# Patient Record
Sex: Female | Born: 1970 | Race: White | Hispanic: No | Smoking: Former smoker
Health system: Southern US, Community
[De-identification: ages and names within clinical notes are randomized; demographics above are authoritative.]

## PROBLEM LIST (undated history)

## (undated) ENCOUNTER — Emergency Department (HOSPITAL_BASED_OUTPATIENT_CLINIC_OR_DEPARTMENT_OTHER): Admission: EM | Source: Home / Self Care

## (undated) DIAGNOSIS — F32A Depression, unspecified: Secondary | ICD-10-CM

## (undated) DIAGNOSIS — K589 Irritable bowel syndrome without diarrhea: Secondary | ICD-10-CM

## (undated) DIAGNOSIS — F319 Bipolar disorder, unspecified: Secondary | ICD-10-CM

## (undated) DIAGNOSIS — K8020 Calculus of gallbladder without cholecystitis without obstruction: Secondary | ICD-10-CM

## (undated) DIAGNOSIS — G8929 Other chronic pain: Secondary | ICD-10-CM

## (undated) DIAGNOSIS — I10 Essential (primary) hypertension: Secondary | ICD-10-CM

## (undated) DIAGNOSIS — M069 Rheumatoid arthritis, unspecified: Secondary | ICD-10-CM

## (undated) DIAGNOSIS — IMO0002 Reserved for concepts with insufficient information to code with codable children: Secondary | ICD-10-CM

## (undated) DIAGNOSIS — E079 Disorder of thyroid, unspecified: Secondary | ICD-10-CM

## (undated) DIAGNOSIS — E669 Obesity, unspecified: Secondary | ICD-10-CM

## (undated) DIAGNOSIS — M0520 Rheumatoid vasculitis with rheumatoid arthritis of unspecified site: Secondary | ICD-10-CM

## (undated) DIAGNOSIS — F329 Major depressive disorder, single episode, unspecified: Secondary | ICD-10-CM

## (undated) DIAGNOSIS — R519 Headache, unspecified: Secondary | ICD-10-CM

## (undated) HISTORY — PX: TERATOMA EXCISION: SHX2491

## (undated) HISTORY — DX: Headache, unspecified: R51.9

## (undated) HISTORY — PX: APPENDECTOMY: SHX54

## (undated) HISTORY — DX: Essential (primary) hypertension: I10

## (undated) HISTORY — DX: Reserved for concepts with insufficient information to code with codable children: IMO0002

## (undated) HISTORY — DX: Depression, unspecified: F32.A

## (undated) HISTORY — PX: MYOMECTOMY: SHX85

## (undated) HISTORY — DX: Calculus of gallbladder without cholecystitis without obstruction: K80.20

## (undated) HISTORY — DX: Rheumatoid vasculitis with rheumatoid arthritis of unspecified site: M05.20

## (undated) HISTORY — PX: CHOLECYSTECTOMY: SHX55

## (undated) HISTORY — DX: Obesity, unspecified: E66.9

## (undated) HISTORY — DX: Disorder of thyroid, unspecified: E07.9

## (undated) HISTORY — DX: Irritable bowel syndrome, unspecified: K58.9

## (undated) HISTORY — DX: Other chronic pain: G89.29

## (undated) HISTORY — PX: UPPER GASTROINTESTINAL ENDOSCOPY: SHX188

## (undated) HISTORY — PX: CYSTECTOMY: SUR359

## (undated) HISTORY — DX: Rheumatoid arthritis, unspecified: M06.9

## (undated) HISTORY — PX: ABDOMINAL HYSTERECTOMY: SHX81

## (undated) HISTORY — DX: Major depressive disorder, single episode, unspecified: F32.9

## (undated) HISTORY — PX: ESOPHAGOGASTRODUODENOSCOPY: SHX1529

---

## 1997-05-13 ENCOUNTER — Other Ambulatory Visit: Admission: RE | Admit: 1997-05-13 | Discharge: 1997-05-13 | Payer: Self-pay | Admitting: Gynecology

## 1997-06-19 ENCOUNTER — Other Ambulatory Visit: Admission: RE | Admit: 1997-06-19 | Discharge: 1997-06-19 | Payer: Self-pay | Admitting: Unknown Physician Specialty

## 2002-01-15 ENCOUNTER — Emergency Department (HOSPITAL_COMMUNITY): Admission: EM | Admit: 2002-01-15 | Discharge: 2002-01-15 | Payer: Self-pay | Admitting: Emergency Medicine

## 2002-02-11 ENCOUNTER — Other Ambulatory Visit: Admission: RE | Admit: 2002-02-11 | Discharge: 2002-02-11 | Payer: Self-pay | Admitting: Obstetrics and Gynecology

## 2002-02-13 ENCOUNTER — Encounter: Admission: RE | Admit: 2002-02-13 | Discharge: 2002-02-13 | Payer: Self-pay | Admitting: Obstetrics and Gynecology

## 2002-02-13 ENCOUNTER — Encounter: Payer: Self-pay | Admitting: Obstetrics and Gynecology

## 2005-03-01 ENCOUNTER — Other Ambulatory Visit: Admission: RE | Admit: 2005-03-01 | Discharge: 2005-03-01 | Payer: Self-pay | Admitting: Obstetrics and Gynecology

## 2005-03-17 ENCOUNTER — Encounter: Admission: RE | Admit: 2005-03-17 | Discharge: 2005-03-17 | Payer: Self-pay | Admitting: Obstetrics and Gynecology

## 2012-02-07 ENCOUNTER — Encounter: Payer: Self-pay | Admitting: *Deleted

## 2012-02-07 ENCOUNTER — Ambulatory Visit (INDEPENDENT_AMBULATORY_CARE_PROVIDER_SITE_OTHER): Payer: No Typology Code available for payment source | Admitting: Emergency Medicine

## 2012-02-07 VITALS — BP 106/76 | HR 111 | Temp 99.3°F | Resp 20 | Ht 69.0 in | Wt 264.0 lb

## 2012-02-07 DIAGNOSIS — R1110 Vomiting, unspecified: Secondary | ICD-10-CM

## 2012-02-07 DIAGNOSIS — A088 Other specified intestinal infections: Secondary | ICD-10-CM

## 2012-02-07 DIAGNOSIS — E860 Dehydration: Secondary | ICD-10-CM

## 2012-02-07 MED ORDER — ONDANSETRON 4 MG PO TBDP
8.0000 mg | ORAL_TABLET | Freq: Once | ORAL | Status: AC
  Administered 2012-02-07: 8 mg via ORAL

## 2012-02-07 MED ORDER — ONDANSETRON 8 MG PO TBDP: 8.0000 mg | ORAL_TABLET | Freq: Three times a day (TID) | ORAL | Status: DC | PRN

## 2012-02-07 NOTE — Progress Notes (Signed)
[facility] [address], [place] [address] [phone]  Date:  02/07/2012   Name:  Grace Butler   DOB:  04-20-1970   MRN:  [id number]  PCP:  No primary provider on file.    Chief Complaint: Vomiting   History of Present Illness:  Grace Butler is a 42 y.o. very pleasant female patient who presents with the following:  Ill since yesterday with persistent nausea and repeated vomiting.  Poor po intake.  Now has headache.  Headache is unusually severe for her and is throbbing and global.  No associated neuro or visual headaches.  Has loose watery stools.  No blood, mucous, or pus in stools.  Says fever and chills but has not taken temperature.  No ill contacts.    There is no problem list on file for this patient.   Past Medical History  Diagnosis Date  . Depression   . Thyroid disease   . Ulcer     Past Surgical History  Procedure Date  . Cholecystectomy   . Abdominal hysterectomy     History  Substance Use Topics  . Smoking status: Current Every Day Smoker -- 0.5 packs/day for 20 years  . Smokeless tobacco: Not on file  . Alcohol Use: No    No family history on file.  Allergies  Allergen Reactions  . Sulfa Antibiotics Hives    Medication list has been reviewed and updated.  Current Outpatient Prescriptions on File Prior to Visit  Medication Sig Dispense Refill  . ARIPiprazole (ABILIFY) 15 MG tablet Take 15 mg by mouth daily.      Marland Kitchen levothyroxine (SYNTHROID, LEVOTHROID) 100 MCG tablet Take by mouth daily.      Marland Kitchen omeprazole (PRILOSEC) 40 MG capsule Take 40 mg by mouth daily.      . QUEtiapine (SEROQUEL) 25 MG tablet Take 25 mg by mouth at bedtime.        Review of Systems:  As per HPI, otherwise negative.    Physical Examination: Filed Vitals:   02/07/12 1914  BP: 106/76  Pulse: 111  Temp: 99.3 F (37.4 C)  Resp: 20   Filed Vitals:   02/07/12 1914  Height: 5\' 9"  (1.753 m)  Weight: 264 lb (119.75 kg)   Body mass  index is 38.99 kg/(m^2). Ideal Body Weight: Weight in (lb) to have BMI = 25: 168.9   GEN: WDWN, NAD, Non-toxic, A & O x 3. Not icteric.  Moderately dry oropharynx HEENT: Atraumatic, Normocephalic. Neck supple. No masses, No LAD. Ears and Nose: No external deformity. CV: RRR, No M/G/R. No JVD. No thrill. No extra heart sounds. PULM: CTA B, no wheezes, crackles, rhonchi. No retractions. No resp. distress. No accessory muscle use. ABD: S, NT, ND, +BS. No rebound. No HSM. EXTR: No c/c/e NEURO Normal gait.  PSYCH: Normally interactive. Conversant. Not depressed or anxious appearing.  Calm demeanor.    Assessment and Plan: Dehydration Gastroenteritis zofran PO fluids at patient request  Carmelina Dane, MD

## 2015-01-21 ENCOUNTER — Other Ambulatory Visit (HOSPITAL_COMMUNITY)
Admission: RE | Admit: 2015-01-21 | Discharge: 2015-01-21 | Disposition: A | Discharge status: Home / Self Care | Payer: Managed Care, Other (non HMO) | Source: Ambulatory Visit | Attending: Obstetrics and Gynecology | Admitting: Obstetrics and Gynecology

## 2015-01-21 ENCOUNTER — Other Ambulatory Visit: Payer: Self-pay | Admitting: Obstetrics and Gynecology

## 2015-01-21 DIAGNOSIS — Z01419 Encounter for gynecological examination (general) (routine) without abnormal findings: Secondary | ICD-10-CM | POA: Diagnosis present

## 2015-01-21 DIAGNOSIS — Z1151 Encounter for screening for human papillomavirus (HPV): Secondary | ICD-10-CM | POA: Insufficient documentation

## 2015-01-23 LAB — CYTOLOGY - PAP

## 2015-08-21 DIAGNOSIS — G43909 Migraine, unspecified, not intractable, without status migrainosus: Secondary | ICD-10-CM | POA: Insufficient documentation

## 2015-08-21 DIAGNOSIS — F319 Bipolar disorder, unspecified: Secondary | ICD-10-CM | POA: Insufficient documentation

## 2015-08-21 DIAGNOSIS — K219 Gastro-esophageal reflux disease without esophagitis: Secondary | ICD-10-CM | POA: Insufficient documentation

## 2015-09-15 DIAGNOSIS — M8580 Other specified disorders of bone density and structure, unspecified site: Secondary | ICD-10-CM | POA: Insufficient documentation

## 2016-01-11 ENCOUNTER — Telehealth: Payer: Self-pay | Admitting: General Practice

## 2016-01-11 NOTE — Telephone Encounter (Signed)
Pt has been scheduled.  °

## 2016-01-11 NOTE — Telephone Encounter (Signed)
Pt called in because she is scheduled to get established with Dr. Patsy Lager. Pt has the concern that she has given out of her headaches/ migraines medication and need it sooner than she is scheduled to come in to see provider. Pt would like to know If provider could either see her sooner or if provider could provide her with a refill until visit scheduled. Advised pt that pcp would need to see her before refill.   Please advise for scheduling   CB: [phone]

## 2016-01-11 NOTE — Telephone Encounter (Signed)
Dr. Patsy Lager is fine with putting two slots together on this Thursday. Right now we have 9 & 9:15 slot and also 10 and 10:15 slot.

## 2016-01-14 ENCOUNTER — Encounter: Payer: Self-pay | Admitting: Family Medicine

## 2016-01-14 ENCOUNTER — Ambulatory Visit (INDEPENDENT_AMBULATORY_CARE_PROVIDER_SITE_OTHER): Payer: BLUE CROSS/BLUE SHIELD | Admitting: Family Medicine

## 2016-01-14 VITALS — BP 116/89 | HR 85 | Temp 98.3°F | Ht 68.5 in | Wt 303.6 lb

## 2016-01-14 DIAGNOSIS — E034 Atrophy of thyroid (acquired): Secondary | ICD-10-CM | POA: Insufficient documentation

## 2016-01-14 DIAGNOSIS — Z23 Encounter for immunization: Secondary | ICD-10-CM | POA: Diagnosis not present

## 2016-01-14 DIAGNOSIS — Z0000 Encounter for general adult medical examination without abnormal findings: Secondary | ICD-10-CM

## 2016-01-14 DIAGNOSIS — Z131 Encounter for screening for diabetes mellitus: Secondary | ICD-10-CM | POA: Diagnosis not present

## 2016-01-14 DIAGNOSIS — Z13220 Encounter for screening for lipoid disorders: Secondary | ICD-10-CM

## 2016-01-14 DIAGNOSIS — Z79890 Hormone replacement therapy (postmenopausal): Secondary | ICD-10-CM | POA: Insufficient documentation

## 2016-01-14 DIAGNOSIS — G43909 Migraine, unspecified, not intractable, without status migrainosus: Secondary | ICD-10-CM

## 2016-01-14 DIAGNOSIS — M069 Rheumatoid arthritis, unspecified: Secondary | ICD-10-CM | POA: Diagnosis not present

## 2016-01-14 DIAGNOSIS — E669 Obesity, unspecified: Secondary | ICD-10-CM | POA: Insufficient documentation

## 2016-01-14 DIAGNOSIS — M0600 Rheumatoid arthritis without rheumatoid factor, unspecified site: Secondary | ICD-10-CM | POA: Insufficient documentation

## 2016-01-14 LAB — CBC
HEMATOCRIT: 40.7 % (ref 36.0–46.0)
HEMOGLOBIN: 13.6 g/dL (ref 12.0–15.0)
MCHC: 33.5 g/dL (ref 30.0–36.0)
MCV: 92.1 fl (ref 78.0–100.0)
PLATELETS: 299 10*3/uL (ref 150.0–400.0)
RBC: 4.42 Mil/uL (ref 3.87–5.11)
RDW: 14.1 % (ref 11.5–15.5)
WBC: 6.3 10*3/uL (ref 4.0–10.5)

## 2016-01-14 LAB — LIPID PANEL
CHOLESTEROL: 247 mg/dL — AB (ref 0–200)
HDL: 55.1 mg/dL (ref >39.00)
LDL Cholesterol: 171 mg/dL — ABNORMAL HIGH (ref 0–99)
NonHDL: 191.47
Total CHOL/HDL Ratio: 4
Triglycerides: 101 mg/dL (ref 0.0–149.0)
VLDL: 20.2 mg/dL (ref 0.0–40.0)

## 2016-01-14 LAB — TSH: TSH: 7.97 u[IU]/mL — AB (ref 0.35–4.50)

## 2016-01-14 LAB — SEDIMENTATION RATE: SED RATE: 20 mm/h (ref 0–20)

## 2016-01-14 LAB — HEMOGLOBIN A1C: HEMOGLOBIN A1C: 5.5 % (ref 4.6–6.5)

## 2016-01-14 LAB — COMPREHENSIVE METABOLIC PANEL
ALBUMIN: 4.1 g/dL (ref 3.5–5.2)
ALT: 26 U/L (ref 0–35)
AST: 23 U/L (ref 0–37)
Alkaline Phosphatase: 81 U/L (ref 39–117)
BUN: 10 mg/dL (ref 6–23)
CALCIUM: 9.7 mg/dL (ref 8.4–10.5)
CO2: 27 mEq/L (ref 19–32)
CREATININE: 0.76 mg/dL (ref 0.40–1.20)
Chloride: 105 mEq/L (ref 96–112)
GFR: 87.72 mL/min (ref >60.00)
Glucose, Bld: 83 mg/dL (ref 70–99)
Potassium: 4.2 mEq/L (ref 3.5–5.1)
Sodium: 141 mEq/L (ref 135–145)
Total Bilirubin: 0.5 mg/dL (ref 0.2–1.2)
Total Protein: 7.3 g/dL (ref 6.0–8.3)

## 2016-01-14 MED ORDER — LEVOTHYROXINE SODIUM 137 MCG PO TABS: 137.0000 ug | ORAL_TABLET | Freq: Every day | ORAL | 3 refills | Status: DC

## 2016-01-14 MED ORDER — AMITRIPTYLINE HCL 50 MG PO TABS: 50.0000 mg | ORAL_TABLET | Freq: Every evening | ORAL | 3 refills | Status: DC

## 2016-01-14 MED ORDER — RIZATRIPTAN BENZOATE 10 MG PO TBDP: 10.0000 mg | ORAL_TABLET | ORAL | 3 refills | Status: DC | PRN

## 2016-01-14 NOTE — Patient Instructions (Signed)
It was very nice to meet you today- will look forward to working with you congrats on quitting smoking!  This is a great thing for your health I will be in touch with your labs asap I would encourage you to work on gradual weight loss- myfitnesspal or other iphone aps can be helpful in tracking calories and exercise. Weight loss of one lb per week is a good goal Take care and we can plan our next visit pending your lab results but likely will be 6 months
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
## 2016-01-16 ENCOUNTER — Encounter: Payer: Self-pay | Admitting: Family Medicine

## 2016-01-16 MED ORDER — LEVOTHYROXINE SODIUM 150 MCG PO TABS: 150.0000 ug | ORAL_TABLET | Freq: Every day | ORAL | 3 refills | Status: DC

## 2016-01-16 NOTE — Addendum Note (Signed)
Addended by: Abbe Amsterdam C on: 01/16/2016 02:29 PM   Modules accepted: Orders

## 2016-01-19 ENCOUNTER — Encounter: Payer: Self-pay | Admitting: Family Medicine

## 2016-02-08 ENCOUNTER — Encounter: Payer: Self-pay | Admitting: Family Medicine

## 2016-02-15 ENCOUNTER — Ambulatory Visit: Payer: Managed Care, Other (non HMO) | Admitting: Family Medicine

## 2016-02-18 ENCOUNTER — Ambulatory Visit: Payer: Managed Care, Other (non HMO) | Admitting: Family Medicine

## 2016-02-25 DIAGNOSIS — H52223 Regular astigmatism, bilateral: Secondary | ICD-10-CM | POA: Diagnosis not present

## 2016-02-25 DIAGNOSIS — H524 Presbyopia: Secondary | ICD-10-CM | POA: Diagnosis not present

## 2016-03-14 ENCOUNTER — Telehealth: Payer: Self-pay | Admitting: Family Medicine

## 2016-03-14 ENCOUNTER — Other Ambulatory Visit: Payer: Self-pay

## 2016-03-14 MED ORDER — OMEPRAZOLE 40 MG PO CPDR: 40.0000 mg | DELAYED_RELEASE_CAPSULE | Freq: Every day | ORAL | 1 refills | Status: DC

## 2016-03-14 NOTE — Telephone Encounter (Signed)
Caller name: Relationship to patient:Self Can be reached: [phone]  Pharmacy:  [facility] - [place], [place] - [address] [phone] (Phone) [phone] (Fax)     Reason for call: Refill omeprazole (PRILOSEC) 40 MG capsule

## 2016-03-22 DIAGNOSIS — F3175 Bipolar disorder, in partial remission, most recent episode depressed: Secondary | ICD-10-CM | POA: Diagnosis not present

## 2016-03-24 ENCOUNTER — Encounter: Payer: Self-pay | Admitting: Family Medicine

## 2016-03-24 MED ORDER — OMEPRAZOLE 40 MG PO CPDR: 40.0000 mg | DELAYED_RELEASE_CAPSULE | Freq: Two times a day (BID) | ORAL | 3 refills | Status: DC

## 2016-05-25 ENCOUNTER — Telehealth: Payer: Self-pay

## 2016-05-25 ENCOUNTER — Telehealth: Payer: Self-pay | Admitting: Family Medicine

## 2016-05-25 DIAGNOSIS — M19072 Primary osteoarthritis, left ankle and foot: Secondary | ICD-10-CM | POA: Diagnosis not present

## 2016-05-25 DIAGNOSIS — F17210 Nicotine dependence, cigarettes, uncomplicated: Secondary | ICD-10-CM | POA: Diagnosis not present

## 2016-05-25 DIAGNOSIS — M0589 Other rheumatoid arthritis with rheumatoid factor of multiple sites: Secondary | ICD-10-CM | POA: Diagnosis not present

## 2016-05-25 DIAGNOSIS — M19071 Primary osteoarthritis, right ankle and foot: Secondary | ICD-10-CM | POA: Diagnosis not present

## 2016-05-25 DIAGNOSIS — Z79899 Other long term (current) drug therapy: Secondary | ICD-10-CM | POA: Diagnosis not present

## 2016-05-25 DIAGNOSIS — M069 Rheumatoid arthritis, unspecified: Secondary | ICD-10-CM | POA: Diagnosis not present

## 2016-05-25 NOTE — Telephone Encounter (Signed)
Patient Name: Grace Butler  DOB: 09-02-1970    Initial Comment Caller says , heart palpitations during the last week, the last one was on Sat. she also was dizzy during that episode. None today.    Nurse Assessment  Nurse: Reed Pandy, RN, Amy Date/Time Lamount Cohen Time): 05/25/2016 1:22:17 PM  Confirm and document reason for call. If symptomatic, describe symptoms. ---Caller states she's had heart palpitations for a couple of months now and are becoming more frequent. The last episode she had was 4 days now. Says she felt light-headed with this episode. Says she is not having any symptoms today. She has noticed some new shortness of breath with activity but she thought it was due to her gaining weight.  Does the patient have any new or worsening symptoms? ---Yes  Will a triage be completed? ---Yes  Related visit to physician within the last 2 weeks? ---No  Does the PT have any chronic conditions? (i.e. diabetes, asthma, etc.) ---Yes  List chronic conditions. ---RA.  Is the patient pregnant or possibly pregnant? (Ask all females between the ages of 57-55) ---No  Is this a behavioral health or substance abuse call? ---No     Guidelines    Guideline Title Affirmed Question Affirmed Notes  Heart Rate and Heartbeat Questions New or worsened shortness of breath with activity (dyspnea on exertion)    Final Disposition User   Go to ED Now (or PCP triage) Reed Pandy, RN, Amy    Comments  call came in at 1232 during system update   Referrals  GO TO FACILITY REFUSED   Disagree/Comply: Disagree  Disagree/Comply Reason: Disagree with instructions

## 2016-05-25 NOTE — Telephone Encounter (Signed)
Patient called stating she is having heart palpitations. Transferred to [facility]

## 2016-05-25 NOTE — Telephone Encounter (Signed)
Received call from [facility] regarding patient haveing off and on Heart palpitations.States patient refused ER and UC visit. States she has appointment with Rheaumotologist,however  None listed in our system. Called patient no answer. Left message for return call

## 2016-07-08 ENCOUNTER — Ambulatory Visit (INDEPENDENT_AMBULATORY_CARE_PROVIDER_SITE_OTHER): Payer: BLUE CROSS/BLUE SHIELD | Admitting: Family Medicine

## 2016-07-08 VITALS — BP 108/72 | HR 91 | Temp 98.4°F | Ht 69.0 in | Wt 305.0 lb

## 2016-07-08 DIAGNOSIS — E034 Atrophy of thyroid (acquired): Secondary | ICD-10-CM | POA: Diagnosis not present

## 2016-07-08 DIAGNOSIS — M791 Myalgia, unspecified site: Secondary | ICD-10-CM

## 2016-07-08 LAB — C-REACTIVE PROTEIN: CRP: 0.6 mg/dL (ref 0.5–20.0)

## 2016-07-08 LAB — CBC
HEMATOCRIT: 40.6 % (ref 36.0–46.0)
HEMOGLOBIN: 13.7 g/dL (ref 12.0–15.0)
MCHC: 33.7 g/dL (ref 30.0–36.0)
MCV: 93.9 fl (ref 78.0–100.0)
Platelets: 285 10*3/uL (ref 150.0–400.0)
RBC: 4.33 Mil/uL (ref 3.87–5.11)
RDW: 14 % (ref 11.5–15.5)
WBC: 6.2 10*3/uL (ref 4.0–10.5)

## 2016-07-08 LAB — TSH: TSH: 2.05 u[IU]/mL (ref 0.35–4.50)

## 2016-07-08 LAB — VITAMIN D 25 HYDROXY (VIT D DEFICIENCY, FRACTURES): VITD: 31.97 ng/mL (ref 30.00–100.00)

## 2016-07-08 LAB — SEDIMENTATION RATE: Sed Rate: 28 mm/hr — ABNORMAL HIGH (ref 0–20)

## 2016-07-08 NOTE — Progress Notes (Signed)
Pre visit review using our clinic review tool, if applicable. No additional management support is needed unless otherwise documented below in the visit note. 

## 2016-07-08 NOTE — Patient Instructions (Addendum)
I will release your labs as they come back via MyChart.  If you change your mind about going on a course of steroids, send me a MyChart message or call.  Heat (pad or rice pillow in microwave) over affected area, 10-15 minutes every 2-3 hours while awake.

## 2016-07-08 NOTE — Progress Notes (Signed)
Chief Complaint  Patient presents with  . Pain    muscle  . Fatigue    Subjective: Patient is a 46 y.o. female here for diffuse muscle pain.  The patient describes diffuse muscle achiness that started around 3 weeks ago. She woke up one day and felt like should the fluid of the other symptoms. She has a history of rheumatoid arthritis and notes that none of the treatments for that have been effective with her muscle pains. Her legs seem to be worse. She has been using diclofenac at home with little relief. She denies any change in activity or injury. There were no changes in oral intake, recent travel, fevers, sickness, sick contacts, or medication changes. No rashes or weakness.  ROS: Neuro: no weakness Msk: As noted in HPI   Past Medical History:  Diagnosis Date  . Depression   . Thyroid disease   . Ulcer ([place])    Allergies  Allergen Reactions  . Sulfa Antibiotics Hives    Current Outpatient Prescriptions:  .  amitriptyline (ELAVIL) 50 MG tablet, Take 1 tablet (50 mg total) by mouth every evening., Disp: 90 tablet, Rfl: 3 .  amphetamine-dextroamphetamine (ADDERALL XR) 20 MG 24 hr capsule, Take 1 capsule by mouth daily., Disp: , Rfl:  .  ARIPiprazole (ABILIFY) 15 MG tablet, Take 15 mg by mouth daily., Disp: , Rfl:  .  cholecalciferol (VITAMIN D) 1000 UNITS tablet, Take 1,000 Units by mouth daily., Disp: , Rfl:  .  Cyanocobalamin (VITAMIN B12 PO), Take 1 tablet by mouth daily., Disp: , Rfl:  .  diclofenac (VOLTAREN) 25 MG EC tablet, Take 25 mg by mouth., Disp: , Rfl:  .  estradiol (CLIMARA - DOSED IN MG/24 HR) 0.05 mg/24hr patch, Place 1 patch onto the skin every 7 days., Disp: , Rfl:  .  levothyroxine (SYNTHROID, LEVOTHROID) 150 MCG tablet, Take 1 tablet (150 mcg total) by mouth daily., Disp: 90 tablet, Rfl: 3 .  omeprazole (PRILOSEC) 40 MG capsule, Take 1 capsule (40 mg total) by mouth 2 (two) times daily., Disp: 180 capsule, Rfl: 3 .  QUEtiapine (SEROQUEL) 25 MG tablet, Take  1 and 1/2 daily, Disp: , Rfl:  .  rizatriptan (MAXALT-MLT) 10 MG disintegrating tablet, Take 1 tablet (10 mg total) by mouth as needed for migraine. May repeat in 2 hours if needed, Disp: 12 tablet, Rfl: 3 .  senna (SENOKOT) 8.6 MG tablet, Take 1 tablet by mouth daily., Disp: , Rfl:  .  Tofacitinib Citrate 5 MG TABS, Take 2 tablets by mouth daily., Disp: , Rfl:   Objective: BP 108/72 (BP Location: Left Arm, Patient Position: Sitting, Cuff Size: Large)   Pulse 91   Temp 98.4 F (36.9 C) (Oral)   Ht _0  (1.753 m)   Wt (!) 305 lb (138.3 kg)   SpO2 96%   BMI 45.04 kg/m  General: Awake, appears stated age HEENT: MMM, appropriate pool of saliva on floor of mouth, EOMi Heart: RRR, no murmurs Lungs: CTAB, no rales, wheezes or rhonchi. No accessory muscle use Abd: BS+, soft, NT, ND, no masses or organomegaly MSK: Diffuse and mild TTP over paraspinal musculature, UE's, LE's muscle groups; 5/5 strength throughout Neuro: Gait normal, DTR's equal and symmetry, no clonus or cerebellar signs Psych: Age appropriate judgment and insight, normal affect and mood  Assessment and Plan: Myalgia - Plan: CBC, Sed Rate (ESR), C-reactive protein, Aldolase, Vitamin D (25 hydroxy)  Hypothyroidism due to acquired atrophy of thyroid - Plan: TSH, T4  Orders  as above. TSH was elevated, she reports compliance with replacement, will recheck today.  Will make sure she doesn't have polymyositis, offered course of steroids, but she declined at this time. She is to call if she changes her mind with this.  F/u pending above.  The patient voiced understanding and agreement to the plan.  [place], DO 07/08/16  8:03 AM

## 2016-07-09 LAB — T4: T4, Total: 9 ug/dL (ref 4.5–12.0)

## 2016-07-11 LAB — ALDOLASE: Aldolase: 4.9 U/L (ref <=8.1)

## 2016-07-12 ENCOUNTER — Encounter: Payer: Self-pay | Admitting: Family Medicine

## 2016-07-13 ENCOUNTER — Other Ambulatory Visit: Payer: Self-pay | Admitting: Family Medicine

## 2016-07-13 MED ORDER — PREDNISONE 20 MG PO TABS: 40.0000 mg | ORAL_TABLET | Freq: Every day | ORAL | 0 refills | Status: AC

## 2016-07-13 NOTE — Progress Notes (Signed)
Steroids called in for pain. Encouraged w/u with rheumatologist. Notified via MyChart.

## 2016-07-28 ENCOUNTER — Encounter: Payer: Self-pay | Admitting: Family Medicine

## 2016-07-29 ENCOUNTER — Telehealth: Payer: Self-pay | Admitting: Medical

## 2016-07-29 NOTE — Telephone Encounter (Signed)
Referral for bariatric

## 2016-08-03 DIAGNOSIS — R5383 Other fatigue: Secondary | ICD-10-CM | POA: Diagnosis not present

## 2016-08-03 DIAGNOSIS — Z79899 Other long term (current) drug therapy: Secondary | ICD-10-CM | POA: Diagnosis not present

## 2016-08-03 DIAGNOSIS — M8580 Other specified disorders of bone density and structure, unspecified site: Secondary | ICD-10-CM | POA: Diagnosis not present

## 2016-08-03 DIAGNOSIS — M0600 Rheumatoid arthritis without rheumatoid factor, unspecified site: Secondary | ICD-10-CM | POA: Diagnosis not present

## 2016-08-03 DIAGNOSIS — M1990 Unspecified osteoarthritis, unspecified site: Secondary | ICD-10-CM | POA: Diagnosis not present

## 2016-08-03 DIAGNOSIS — G5601 Carpal tunnel syndrome, right upper limb: Secondary | ICD-10-CM | POA: Diagnosis not present

## 2016-08-03 DIAGNOSIS — Z7952 Long term (current) use of systemic steroids: Secondary | ICD-10-CM | POA: Diagnosis not present

## 2016-08-17 ENCOUNTER — Encounter: Payer: Self-pay | Admitting: Family Medicine

## 2016-08-19 DIAGNOSIS — M8588 Other specified disorders of bone density and structure, other site: Secondary | ICD-10-CM | POA: Diagnosis not present

## 2016-08-30 ENCOUNTER — Encounter: Payer: Self-pay | Admitting: Family

## 2016-08-30 ENCOUNTER — Ambulatory Visit (INDEPENDENT_AMBULATORY_CARE_PROVIDER_SITE_OTHER): Payer: BLUE CROSS/BLUE SHIELD | Admitting: Family

## 2016-08-30 VITALS — BP 130/92 | HR 99 | Temp 98.9°F | Ht 69.0 in | Wt 310.0 lb

## 2016-08-30 DIAGNOSIS — M25562 Pain in left knee: Secondary | ICD-10-CM | POA: Diagnosis not present

## 2016-08-30 MED ORDER — PREDNISONE 20 MG PO TABS: 40.0000 mg | ORAL_TABLET | Freq: Every day | ORAL | Status: DC
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
## 2016-08-30 NOTE — Patient Instructions (Signed)
You will be contacted about your referral to Dr. Pearletha Forge, sports medicine.

## 2016-08-31 ENCOUNTER — Encounter: Payer: Self-pay | Admitting: Family Medicine

## 2016-08-31 ENCOUNTER — Ambulatory Visit (INDEPENDENT_AMBULATORY_CARE_PROVIDER_SITE_OTHER): Payer: BLUE CROSS/BLUE SHIELD | Admitting: Family Medicine

## 2016-08-31 DIAGNOSIS — M25562 Pain in left knee: Secondary | ICD-10-CM

## 2016-08-31 NOTE — Patient Instructions (Signed)
You have had a patellar subluxation (kneecap). Ice the area for 15 minutes at a time 3-4 times a day. Elevate above the level of your heart when possible. Take your usual medications for RA as you have been with tylenol as needed. Hinged knee brace for support when up and walking around though an immobilizer is also an option (we usually reserve these for full dislocations because your quad strength decreases in this). Straight leg raises, straight leg raises with foot turned outwards, and hip side raises 3 sets of 10 once a day. Add ankle weight if these become too easy. Consider physical therapy if not improving. Follow up with me in about 1 month but call me sooner if you're struggling.

## 2016-09-01 DIAGNOSIS — G8929 Other chronic pain: Secondary | ICD-10-CM | POA: Insufficient documentation

## 2016-09-01 DIAGNOSIS — M25562 Pain in left knee: Secondary | ICD-10-CM | POA: Insufficient documentation
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
## 2016-09-01 NOTE — Assessment & Plan Note (Signed)
consistent with patellar subluxation.  Icing, elevation.  Knee brace for support when up and walking around.  Shown home exercises to do daily - she prefers to do this instead of physical therapy for now.  She will continue her prednisone.  F/u in 1 month for reevaluation.

## 2016-09-29 DIAGNOSIS — F3175 Bipolar disorder, in partial remission, most recent episode depressed: Secondary | ICD-10-CM | POA: Diagnosis not present

## 2016-10-12 ENCOUNTER — Ambulatory Visit: Payer: BLUE CROSS/BLUE SHIELD | Admitting: Family Medicine

## 2016-10-29 ENCOUNTER — Other Ambulatory Visit: Payer: Self-pay | Admitting: Family Medicine

## 2016-10-29 DIAGNOSIS — G43909 Migraine, unspecified, not intractable, without status migrainosus: Secondary | ICD-10-CM

## 2016-11-12 ENCOUNTER — Other Ambulatory Visit: Payer: Self-pay | Admitting: Family Medicine

## 2016-11-12 DIAGNOSIS — E034 Atrophy of thyroid (acquired): Secondary | ICD-10-CM

## 2016-12-09 ENCOUNTER — Other Ambulatory Visit: Payer: Self-pay | Admitting: Family Medicine

## 2017-01-01 ENCOUNTER — Encounter: Payer: Self-pay | Admitting: Family Medicine

## 2017-01-01 DIAGNOSIS — Z79899 Other long term (current) drug therapy: Secondary | ICD-10-CM

## 2017-01-01 DIAGNOSIS — M069 Rheumatoid arthritis, unspecified: Secondary | ICD-10-CM

## 2017-01-03 HISTORY — PX: KNEE ARTHROSCOPY: SHX127

## 2017-01-05 NOTE — Telephone Encounter (Signed)
Ordered CBC, BMP and liver function panel Fax results to [phone] attn Dr. Fabio Asa

## 2017-01-06 ENCOUNTER — Other Ambulatory Visit (INDEPENDENT_AMBULATORY_CARE_PROVIDER_SITE_OTHER): Payer: BLUE CROSS/BLUE SHIELD

## 2017-01-06 DIAGNOSIS — M069 Rheumatoid arthritis, unspecified: Secondary | ICD-10-CM

## 2017-01-06 DIAGNOSIS — Z79899 Other long term (current) drug therapy: Secondary | ICD-10-CM

## 2017-01-06 LAB — BASIC METABOLIC PANEL
BUN: 14 mg/dL (ref 6–23)
CALCIUM: 9.5 mg/dL (ref 8.4–10.5)
CO2: 28 mEq/L (ref 19–32)
CREATININE: 0.82 mg/dL (ref 0.40–1.20)
Chloride: 102 mEq/L (ref 96–112)
GFR: 80 mL/min (ref >60.00)
GLUCOSE: 82 mg/dL (ref 70–99)
Potassium: 4.2 mEq/L (ref 3.5–5.1)
SODIUM: 139 meq/L (ref 135–145)

## 2017-01-06 LAB — CBC
HCT: 41.6 % (ref 36.0–46.0)
HEMOGLOBIN: 13.6 g/dL (ref 12.0–15.0)
MCHC: 32.8 g/dL (ref 30.0–36.0)
MCV: 94.9 fl (ref 78.0–100.0)
PLATELETS: 331 10*3/uL (ref 150.0–400.0)
RBC: 4.38 Mil/uL (ref 3.87–5.11)
RDW: 14.4 % (ref 11.5–15.5)
WBC: 8.4 10*3/uL (ref 4.0–10.5)

## 2017-01-06 LAB — HEPATIC FUNCTION PANEL
ALBUMIN: 4.2 g/dL (ref 3.5–5.2)
ALK PHOS: 99 U/L (ref 39–117)
ALT: 36 U/L — ABNORMAL HIGH (ref 0–35)
AST: 29 U/L (ref 0–37)
Bilirubin, Direct: 0.1 mg/dL (ref 0.0–0.3)
TOTAL PROTEIN: 7.5 g/dL (ref 6.0–8.3)
Total Bilirubin: 0.5 mg/dL (ref 0.2–1.2)

## 2017-01-06 NOTE — Addendum Note (Signed)
Addended by: Harley Alto on: 01/06/2017 01:09 PM   Modules accepted: Orders

## 2017-01-06 NOTE — Addendum Note (Signed)
Addended by: Maghan Jessee M on: 01/06/2017 01:09 PM   Modules accepted: Orders  

## 2017-01-06 NOTE — Addendum Note (Signed)
Addended by: PRICE, KRISTY M on: 01/06/2017 01:09 PM   Modules accepted: Orders  

## 2017-01-07 ENCOUNTER — Encounter: Payer: Self-pay | Admitting: Family Medicine

## 2017-01-12 ENCOUNTER — Ambulatory Visit: Payer: BLUE CROSS/BLUE SHIELD | Admitting: Family Medicine

## 2017-01-13 NOTE — Progress Notes (Signed)
Faxed

## 2017-01-16 ENCOUNTER — Ambulatory Visit (HOSPITAL_BASED_OUTPATIENT_CLINIC_OR_DEPARTMENT_OTHER)
Admission: RE | Admit: 2017-01-16 | Discharge: 2017-01-16 | Disposition: A | Discharge status: Home / Self Care | Payer: BLUE CROSS/BLUE SHIELD | Source: Ambulatory Visit | Attending: Family Medicine | Admitting: Family Medicine

## 2017-01-16 ENCOUNTER — Encounter: Payer: Self-pay | Admitting: Family Medicine

## 2017-01-16 ENCOUNTER — Ambulatory Visit: Payer: BLUE CROSS/BLUE SHIELD | Admitting: Family Medicine

## 2017-01-16 VITALS — BP 139/41 | HR 82 | Ht 69.0 in | Wt 310.0 lb

## 2017-01-16 DIAGNOSIS — M25562 Pain in left knee: Secondary | ICD-10-CM | POA: Diagnosis not present

## 2017-01-16 DIAGNOSIS — M722 Plantar fascial fibromatosis: Secondary | ICD-10-CM

## 2017-01-16 MED ORDER — DIAZEPAM 5 MG PO TABS: ORAL_TABLET | ORAL | 0 refills | Status: DC

## 2017-01-16 NOTE — Patient Instructions (Signed)
We will go ahead with an MRI of your knee to assess for a meniscus tear and to characterize the small calcification in the medial compartment. We will check with the imaging place to make sure they have an open MRI and it's not in a truck. I sent valium in for you to take before the MRI. Follow up will depend on the MRI.

## 2017-01-17 ENCOUNTER — Ambulatory Visit: Payer: BLUE CROSS/BLUE SHIELD | Admitting: Family Medicine

## 2017-01-17 ENCOUNTER — Encounter: Payer: Self-pay | Admitting: Family Medicine

## 2017-01-17 DIAGNOSIS — M722 Plantar fascial fibromatosis: Secondary | ICD-10-CM | POA: Insufficient documentation

## 2017-01-17 NOTE — Assessment & Plan Note (Signed)
initial concern for patellar subluxation - now with medial pain in addition to post patellar facet tenderness.  Independently reviewed radiographs and very little arthritis - small calcification medially - ? If within medial meniscus, this is well corticated.  As she's not improving with conservative treatment will go ahead with MRI to further assess for meniscus tear.  Knee brace, icing, elevation.  Takes prednisone - continue this.  Tylenol if needed.

## 2017-01-17 NOTE — Assessment & Plan Note (Signed)
discussed arch binders, stretches, arch supports.
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
## 2017-01-18 NOTE — Addendum Note (Signed)
Addended by: Kathi Simpers F on: 01/18/2017 02:20 PM   Modules accepted: Orders

## 2017-01-25 ENCOUNTER — Ambulatory Visit
Admission: RE | Admit: 2017-01-25 | Discharge: 2017-01-25 | Disposition: A | Discharge status: Home / Self Care | Payer: BLUE CROSS/BLUE SHIELD | Source: Ambulatory Visit | Attending: Family Medicine | Admitting: Family Medicine

## 2017-01-25 DIAGNOSIS — M25562 Pain in left knee: Secondary | ICD-10-CM | POA: Diagnosis not present

## 2017-01-30 ENCOUNTER — Telehealth: Payer: Self-pay | Admitting: Family Medicine

## 2017-01-30 NOTE — Addendum Note (Signed)
Addended by: Kathi Simpers F on: 01/30/2017 09:23 AM   Modules accepted: Orders

## 2017-01-30 NOTE — Telephone Encounter (Signed)
Ok to go ahead with referral - for meniscus tears, osteochondral defect of her knee.  Thanks!

## 2017-01-30 NOTE — Telephone Encounter (Signed)
Correction: Orthopedic surgeon is Dr. Di Kindle. [place]um[place].   [place] on [address]

## 2017-01-30 NOTE — Telephone Encounter (Signed)
Patient left message regarding orthopedic surgeon for knee surgery.  States she would like to be sent to Dr. Chesley Mires. Patient said he works at [employer] in [place]

## 2017-02-06 DIAGNOSIS — E039 Hypothyroidism, unspecified: Secondary | ICD-10-CM | POA: Diagnosis not present

## 2017-02-06 DIAGNOSIS — Z01818 Encounter for other preprocedural examination: Secondary | ICD-10-CM | POA: Diagnosis not present

## 2017-02-06 DIAGNOSIS — K219 Gastro-esophageal reflux disease without esophagitis: Secondary | ICD-10-CM | POA: Diagnosis not present

## 2017-02-07 DIAGNOSIS — R52 Pain, unspecified: Secondary | ICD-10-CM | POA: Diagnosis not present

## 2017-02-07 DIAGNOSIS — S83207D Unspecified tear of unspecified meniscus, current injury, left knee, subsequent encounter: Secondary | ICD-10-CM | POA: Diagnosis not present

## 2017-02-10 DIAGNOSIS — S83207D Unspecified tear of unspecified meniscus, current injury, left knee, subsequent encounter: Secondary | ICD-10-CM

## 2017-02-10 HISTORY — DX: Unspecified tear of unspecified meniscus, current injury, left knee, subsequent encounter: S83.207D

## 2017-02-24 ENCOUNTER — Encounter (HOSPITAL_BASED_OUTPATIENT_CLINIC_OR_DEPARTMENT_OTHER): Payer: Self-pay | Admitting: *Deleted

## 2017-02-24 ENCOUNTER — Emergency Department (HOSPITAL_BASED_OUTPATIENT_CLINIC_OR_DEPARTMENT_OTHER)
Admission: EM | Admit: 2017-02-24 | Discharge: 2017-02-24 | Disposition: A | Discharge status: Home / Self Care | Payer: BLUE CROSS/BLUE SHIELD | Attending: Emergency Medicine | Admitting: Emergency Medicine

## 2017-02-24 ENCOUNTER — Emergency Department (HOSPITAL_BASED_OUTPATIENT_CLINIC_OR_DEPARTMENT_OTHER): Payer: BLUE CROSS/BLUE SHIELD

## 2017-02-24 ENCOUNTER — Other Ambulatory Visit: Payer: Self-pay

## 2017-02-24 DIAGNOSIS — F17200 Nicotine dependence, unspecified, uncomplicated: Secondary | ICD-10-CM | POA: Diagnosis not present

## 2017-02-24 DIAGNOSIS — Z79899 Other long term (current) drug therapy: Secondary | ICD-10-CM | POA: Insufficient documentation

## 2017-02-24 DIAGNOSIS — S99922A Unspecified injury of left foot, initial encounter: Secondary | ICD-10-CM | POA: Diagnosis not present

## 2017-02-24 DIAGNOSIS — E039 Hypothyroidism, unspecified: Secondary | ICD-10-CM | POA: Insufficient documentation

## 2017-02-24 DIAGNOSIS — M79672 Pain in left foot: Secondary | ICD-10-CM | POA: Insufficient documentation

## 2017-02-24 DIAGNOSIS — M25572 Pain in left ankle and joints of left foot: Secondary | ICD-10-CM | POA: Diagnosis not present

## 2017-02-24 NOTE — ED Notes (Signed)
PT did not wish to have crutches because she will have them after her knee surgery.

## 2017-02-24 NOTE — ED Provider Notes (Signed)
[facility] EMERGENCY DEPARTMENT Provider Note   CSN: [id number] Arrival date & time: 02/24/17  1858     History   Chief Complaint Chief Complaint  Patient presents with  . Foot Pain    HPI Grace Butler is a 47 y.o. female.  Patient with history of rheumatoid arthritis presents to the emergency department today with complaint of acute onset of left foot pain that occurred when she took a normal step today.  She felt a pop when it occurred.  Pain has been constant.  It is worse when she steps or bears weight on it.  Patient has a history of left meniscal repair with upcoming surgery scheduled, and she notes that she has been walking on the outside of the foot recently.  She denies any knee pain or hip pain.  No treatments prior to arrival.  She is due to start a course of prednisone for RA type of pain.  Patient also takes diclofenac.      Past Medical History:  Diagnosis Date  . Depression   . Thyroid disease   . Ulcer     Patient Active Problem List   Diagnosis Date Noted  . Plantar fasciitis of left foot 01/17/2017  . Left knee pain 09/01/2016  . Obesity 01/14/2016  . Rheumatoid arthritis (HCC) 01/14/2016  . Hypothyroidism due to acquired atrophy of thyroid 01/14/2016  . Hormone replacement therapy 01/14/2016  . Osteopenia 09/15/2015  . Bipolar 1 disorder (HCC) 08/21/2015  . Gastroesophageal reflux disease without esophagitis 08/21/2015  . Migraine 08/21/2015    Past Surgical History:  Procedure Laterality Date  . ABDOMINAL HYSTERECTOMY    . CHOLECYSTECTOMY    . 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 44    OB History    No data available       Home Medications    Prior to Admission medications   Medication Sig Start Date End Date Taking? Authorizing Provider  alendronate (FOSAMAX) 70 MG tablet Take 70 mg by mouth. 08/31/16   [provider]  amitriptyline (ELAVIL) 50 MG tablet TAKE 1 TABLET BY MOUTH EVERY EVENING 11/01/16   Copland, Gwenlyn Found,  MD  amphetamine-dextroamphetamine (ADDERALL XR) 20 MG 24 hr capsule Take 1 capsule by mouth daily. 02/01/14   [provider]  ARIPiprazole (ABILIFY) 15 MG tablet Take 15 mg by mouth daily.    [provider]  cholecalciferol (VITAMIN D) 1000 UNITS tablet Take 1,000 Units by mouth daily.    [provider]  Cyanocobalamin (VITAMIN B12 PO) Take 1 tablet by mouth daily.    [provider]  diazepam (VALIUM) 5 MG tablet 1 tab po 30 minutes prior to procedure, may repeat x1 01/16/17   Hudnall, Azucena Fallen, MD  diclofenac (VOLTAREN) 25 MG EC tablet Take 25 mg by mouth.    [provider]  estradiol (CLIMARA - DOSED IN MG/24 HR) 0.05 mg/24hr patch Place 1 patch onto the skin every 7 days. 07/11/15   [provider]  levothyroxine (SYNTHROID, LEVOTHROID) 150 MCG tablet TAKE 1 TABLET BY MOUTH EVERY DAY 11/14/16   Copland, Gwenlyn Found, MD  omeprazole (PRILOSEC) 40 MG capsule Take 1 capsule (40 mg total) by mouth 2 (two) times daily before a meal. 12/09/16   Copland, Gwenlyn Found, MD  predniSONE (DELTASONE) 20 MG tablet Take 2 tablets (40 mg total) by mouth daily with breakfast. 08/30/16   Sandford Craze, NP  QUEtiapine (SEROQUEL) 25 MG tablet Take 1 and 1/2 daily  [provider]  rizatriptan (MAXALT-MLT) 10 MG disintegrating tablet Take 1 tablet (10 mg total) by mouth as needed for migraine. May repeat in 2 hours if needed 01/14/16   Copland, Gwenlyn Found, MD  senna (SENOKOT) 8.6 MG tablet Take 1 tablet by mouth daily.    [provider]  Tofacitinib Citrate 5 MG TABS Take 2 tablets by mouth daily.    [provider]    Family History No family history on file.  Social History Social History   Tobacco Use  . Smoking status: Current Every Day Smoker    Packs/day: 0.50    Years: 20.00    Pack years: 10.00  . Smokeless tobacco: Never Used  Substance Use Topics  . Alcohol use: No  . Drug use: No     Allergies   Sulfa  antibiotics   Review of Systems Review of Systems  Constitutional: Negative for activity change.  Musculoskeletal: Positive for arthralgias and gait problem. Negative for back pain, joint swelling and neck pain.  Skin: Negative for wound.  Neurological: Negative for weakness and numbness.     Physical Exam Updated Vital Signs BP (!) 138/98 (BP Location: Right Arm)   Temp 98.6 F (37 C) (Oral)   Resp 18   Ht 5\' 9"  (1.753 m)   Wt 136.1 kg (300 lb)   SpO2 99%   BMI 44.30 kg/m   Physical Exam  Constitutional: She appears well-developed and well-nourished.  HENT:  Head: Normocephalic and atraumatic.  Eyes: Pupils are equal, round, and reactive to light.  Neck: Normal range of motion. Neck supple.  Cardiovascular: Normal pulses. Exam reveals no decreased pulses.  Musculoskeletal: She exhibits tenderness. She exhibits no edema.       Left ankle: No tenderness.       Left lower leg: Normal.       Left foot: There is decreased range of motion and tenderness. There is no bony tenderness.       Feet:  Neurological: She is alert. No sensory deficit.  Motor, sensation, and vascular distal to the injury is fully intact.   Skin: Skin is warm and dry.  Psychiatric: She has a normal mood and affect.  Nursing note and vitals reviewed.    ED Treatments / Results  Labs (all labs ordered are listed, but only abnormal results are displayed) Labs Reviewed - No data to display  EKG  EKG Interpretation None       Radiology Dg Foot Complete Left  Result Date: 02/24/2017 CLINICAL DATA:  Felt pop in left foot with pain EXAM: LEFT FOOT - COMPLETE 3+ VIEW COMPARISON:  None. FINDINGS: No fracture or malalignment.  Soft tissues are unremarkable. IMPRESSION: No acute osseous abnormality Electronically Signed   By: 02/26/2017 M.D.   On: 02/24/2017 19:45    Procedures Procedures (including critical care time)  Medications Ordered in ED Medications - No data to display   Initial  Impression / Assessment and Plan / ED Course  I have reviewed the triage vital signs and the nursing notes.  Pertinent labs & imaging results that were available during my care of the patient were reviewed by me and considered in my medical decision making (see chart for details).     Patient seen and examined.   Vital signs reviewed and are as follows: BP (!) 138/98 (BP Location: Right Arm)   Temp 98.6 F (37 C) (Oral)   Resp 18   Ht 5\' 9"  (1.753 m)  Wt 136.1 kg (300 lb)   SpO2 99%   BMI 44.30 kg/m   X-ray results reviewed by myself with patient at bedside.  We discussed supportive care.  She is starting prednisone and will continue diclofenac which may help.  She has appropriate orthopedic follow-up.  Offered crutches, patient declines.  Final Clinical Impressions(s) / ED Diagnoses   Final diagnoses:  Left foot pain   Patient with left foot pain after a misstep today.  No broken bones.  Likely plantar fascia strain or tear.  Patient will use anti-inflammatories, rice protocol, follow-up with orthopedist.  ED Discharge Orders    None       Renne Crigler, Cordelia Poche 02/24/17 2106    Maia Plan, MD 02/25/17 1118

## 2017-02-24 NOTE — Discharge Instructions (Signed)
Please read and follow all provided instructions.  Your diagnoses today include:  1. Left foot pain     Tests performed today include:  An x-ray of the affected area - does NOT show any broken bones  Vital signs. See below for your results today.   Medications prescribed:   None  Take any prescribed medications only as directed.  Home care instructions:   Follow any educational materials contained in this packet  Follow R.I.C.E. Protocol:  R - rest your injury   I  - use ice on injury without applying directly to skin  C - compress injury with bandage or splint  E - elevate the injury as much as possible  Follow-up instructions: Please follow-up with your primary care provider or the provided orthopedic physician (bone specialist) if you continue to have significant pain in 1 week. In this case you may have a more severe injury that requires further care.   Return instructions:   Please return if your toes or feet are numb or tingling, appear gray or blue, or you have severe pain (also elevate the leg and loosen splint or wrap if you were given one)  Please return to the Emergency Department if you experience worsening symptoms.   Please return if you have any other emergent concerns.  Additional Information:  Your vital signs today were: BP (!) 138/98 (BP Location: Right Arm)    Temp 98.6 F (37 C) (Oral)    Resp 18    Ht 5\' 9"  (1.753 m)    Wt 136.1 kg (300 lb)    SpO2 99%    BMI 44.30 kg/m  If your blood pressure (BP) was elevated above 135/85 this visit, please have this repeated by your doctor within one month. --------------

## 2017-02-24 NOTE — ED Triage Notes (Signed)
Left foot pain while on her am walk today. Burning pain. She is ambulatory. Certain positions make the pain go away.

## 2017-03-08 DIAGNOSIS — K219 Gastro-esophageal reflux disease without esophagitis: Secondary | ICD-10-CM | POA: Diagnosis not present

## 2017-03-08 DIAGNOSIS — E039 Hypothyroidism, unspecified: Secondary | ICD-10-CM | POA: Diagnosis not present

## 2017-03-16 DIAGNOSIS — M23352 Other meniscus derangements, posterior horn of lateral meniscus, left knee: Secondary | ICD-10-CM | POA: Diagnosis not present

## 2017-03-16 DIAGNOSIS — S83272A Complex tear of lateral meniscus, current injury, left knee, initial encounter: Secondary | ICD-10-CM | POA: Diagnosis not present

## 2017-03-16 DIAGNOSIS — F17290 Nicotine dependence, other tobacco product, uncomplicated: Secondary | ICD-10-CM | POA: Diagnosis not present

## 2017-03-16 DIAGNOSIS — E079 Disorder of thyroid, unspecified: Secondary | ICD-10-CM | POA: Diagnosis not present

## 2017-03-16 DIAGNOSIS — M1712 Unilateral primary osteoarthritis, left knee: Secondary | ICD-10-CM | POA: Diagnosis not present

## 2017-03-16 DIAGNOSIS — M069 Rheumatoid arthritis, unspecified: Secondary | ICD-10-CM | POA: Diagnosis not present

## 2017-03-16 DIAGNOSIS — Z882 Allergy status to sulfonamides status: Secondary | ICD-10-CM | POA: Diagnosis not present

## 2017-03-16 DIAGNOSIS — M23252 Derangement of posterior horn of lateral meniscus due to old tear or injury, left knee: Secondary | ICD-10-CM | POA: Diagnosis not present

## 2017-03-16 DIAGNOSIS — M94262 Chondromalacia, left knee: Secondary | ICD-10-CM | POA: Diagnosis not present

## 2017-03-16 DIAGNOSIS — K219 Gastro-esophageal reflux disease without esophagitis: Secondary | ICD-10-CM | POA: Diagnosis not present

## 2017-03-16 DIAGNOSIS — Z79899 Other long term (current) drug therapy: Secondary | ICD-10-CM | POA: Diagnosis not present

## 2017-03-16 DIAGNOSIS — Z6841 Body Mass Index (BMI) 40.0 and over, adult: Secondary | ICD-10-CM | POA: Diagnosis not present

## 2017-03-16 DIAGNOSIS — Z791 Long term (current) use of non-steroidal anti-inflammatories (NSAID): Secondary | ICD-10-CM | POA: Diagnosis not present

## 2017-03-16 DIAGNOSIS — F319 Bipolar disorder, unspecified: Secondary | ICD-10-CM | POA: Diagnosis not present

## 2017-03-17 ENCOUNTER — Other Ambulatory Visit: Payer: Self-pay | Admitting: Family Medicine

## 2017-03-23 ENCOUNTER — Telehealth: Payer: Self-pay | Admitting: Family Medicine

## 2017-03-23 DIAGNOSIS — S83207D Unspecified tear of unspecified meniscus, current injury, left knee, subsequent encounter: Secondary | ICD-10-CM | POA: Diagnosis not present

## 2017-03-23 DIAGNOSIS — E039 Hypothyroidism, unspecified: Secondary | ICD-10-CM

## 2017-03-23 DIAGNOSIS — M79605 Pain in left leg: Secondary | ICD-10-CM | POA: Diagnosis not present

## 2017-03-23 NOTE — Telephone Encounter (Signed)
Copied from CRM [id number]. Topic: Quick Communication - See Telephone Encounter >> Mar 23, 2017 12:07 PM Rudi Coco, NT wrote: CRM for notification. See Telephone encounter for: 03/23/17.  Pt. Calling needing to speak with nurse or Dr. Patsy Lager. Pt. Is trying to get everything together for heart surgery but at a  stand still due to some levels being high. Pt. Would like to speak with someone with concerns about whats going on.

## 2017-03-23 NOTE — Telephone Encounter (Signed)
She is thinking of having weight loss surgery per [facility]  However her TSH recently was very high apparently which is strange- she is still taking her synthroid faithfully.  Her surgery is on hold due to this She will come in tomorrow to have a TSH and T4/ free t3 drawn  I am not sure if her recent TSH may have been an error?

## 2017-03-24 ENCOUNTER — Other Ambulatory Visit (INDEPENDENT_AMBULATORY_CARE_PROVIDER_SITE_OTHER): Payer: BLUE CROSS/BLUE SHIELD

## 2017-03-24 ENCOUNTER — Encounter: Payer: Self-pay | Admitting: Family Medicine

## 2017-03-24 DIAGNOSIS — E034 Atrophy of thyroid (acquired): Secondary | ICD-10-CM

## 2017-03-24 LAB — T4: T4, Total: 14.2 ug/dL — ABNORMAL HIGH (ref 5.1–11.9)

## 2017-03-24 LAB — T3, FREE: T3 FREE: 3.3 pg/mL (ref 2.3–4.2)

## 2017-03-24 LAB — TSH: TSH: 1.86 u[IU]/mL (ref 0.35–4.50)

## 2017-03-26 ENCOUNTER — Other Ambulatory Visit: Payer: Self-pay | Admitting: Family Medicine

## 2017-03-26 DIAGNOSIS — E034 Atrophy of thyroid (acquired): Secondary | ICD-10-CM

## 2017-03-27 DIAGNOSIS — S83207D Unspecified tear of unspecified meniscus, current injury, left knee, subsequent encounter: Secondary | ICD-10-CM | POA: Diagnosis not present

## 2017-04-03 DIAGNOSIS — M25562 Pain in left knee: Secondary | ICD-10-CM | POA: Diagnosis not present

## 2017-04-03 DIAGNOSIS — Z4789 Encounter for other orthopedic aftercare: Secondary | ICD-10-CM | POA: Diagnosis not present

## 2017-04-03 DIAGNOSIS — M6281 Muscle weakness (generalized): Secondary | ICD-10-CM | POA: Diagnosis not present

## 2017-04-05 DIAGNOSIS — Z4789 Encounter for other orthopedic aftercare: Secondary | ICD-10-CM | POA: Diagnosis not present

## 2017-04-05 DIAGNOSIS — M6281 Muscle weakness (generalized): Secondary | ICD-10-CM | POA: Diagnosis not present

## 2017-04-05 DIAGNOSIS — M25562 Pain in left knee: Secondary | ICD-10-CM | POA: Diagnosis not present

## 2017-04-07 NOTE — Telephone Encounter (Signed)
Copied from CRM [id number]. Topic: General - Other >> Apr 06, 2017 11:10 AM Percival Spanish wrote:  Pt call to say she will not be going to [facility] and would like to a referral to Dr Adolphus [facility] in [place] 336  [phone]

## 2017-04-24 ENCOUNTER — Other Ambulatory Visit: Payer: Self-pay | Admitting: Family Medicine

## 2017-04-24 DIAGNOSIS — G43909 Migraine, unspecified, not intractable, without status migrainosus: Secondary | ICD-10-CM

## 2017-04-27 DIAGNOSIS — F3175 Bipolar disorder, in partial remission, most recent episode depressed: Secondary | ICD-10-CM | POA: Diagnosis not present

## 2017-05-01 DIAGNOSIS — M8589 Other specified disorders of bone density and structure, multiple sites: Secondary | ICD-10-CM | POA: Diagnosis not present

## 2017-05-01 DIAGNOSIS — M069 Rheumatoid arthritis, unspecified: Secondary | ICD-10-CM | POA: Diagnosis not present

## 2017-05-01 DIAGNOSIS — M19071 Primary osteoarthritis, right ankle and foot: Secondary | ICD-10-CM | POA: Diagnosis not present

## 2017-05-01 DIAGNOSIS — M19072 Primary osteoarthritis, left ankle and foot: Secondary | ICD-10-CM | POA: Diagnosis not present

## 2017-05-29 ENCOUNTER — Encounter: Payer: Self-pay | Admitting: Family Medicine

## 2017-05-29 ENCOUNTER — Other Ambulatory Visit: Payer: Self-pay | Admitting: Family Medicine

## 2017-05-29 DIAGNOSIS — G43909 Migraine, unspecified, not intractable, without status migrainosus: Secondary | ICD-10-CM

## 2017-06-08 DIAGNOSIS — K219 Gastro-esophageal reflux disease without esophagitis: Secondary | ICD-10-CM | POA: Diagnosis not present

## 2017-06-08 DIAGNOSIS — F988 Other specified behavioral and emotional disorders with onset usually occurring in childhood and adolescence: Secondary | ICD-10-CM | POA: Diagnosis not present

## 2017-06-08 DIAGNOSIS — M069 Rheumatoid arthritis, unspecified: Secondary | ICD-10-CM | POA: Diagnosis not present

## 2017-06-14 DIAGNOSIS — E782 Mixed hyperlipidemia: Secondary | ICD-10-CM | POA: Diagnosis not present

## 2017-06-14 DIAGNOSIS — F17200 Nicotine dependence, unspecified, uncomplicated: Secondary | ICD-10-CM | POA: Diagnosis not present

## 2017-06-14 DIAGNOSIS — Z6841 Body Mass Index (BMI) 40.0 and over, adult: Secondary | ICD-10-CM | POA: Diagnosis not present

## 2017-06-14 DIAGNOSIS — Z136 Encounter for screening for cardiovascular disorders: Secondary | ICD-10-CM | POA: Diagnosis not present

## 2017-06-14 DIAGNOSIS — F3160 Bipolar disorder, current episode mixed, unspecified: Secondary | ICD-10-CM | POA: Diagnosis not present

## 2017-06-15 ENCOUNTER — Encounter: Payer: Self-pay | Admitting: Family Medicine

## 2017-06-15 LAB — TSH: TSH: 7.62 — AB (ref 0.41–5.90)

## 2017-06-18 NOTE — Progress Notes (Signed)
[facility] at [facility] [address] [place], [place] [address] [phone]  Date:  06/19/2017   Name:  Grace Butler   DOB:  August 12, 1970   MRN:  [id number]  PCP:  Grace Cables, MD    Chief Complaint: No chief complaint on file.   History of Present Illness:  Grace Butler is a 47 y.o. very pleasant female patient who presents with the following:  Pt had contacted me about some lab concerns discovered during recent work up for possible bariatric surgery  I last saw her in January of 2018: Here today as a new patient to establish care She has lived in this area her whole life.  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 Her job is busy but fulfilling In her free time - she does not have much- she enjoys reading, yard work, home renovations She is married to Grace Butler History of obesity, RA.  She is on Harriette Ohara that does help with her RA sx.   Psychiatrist: Melony Overly, PA-C at [facility].  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  She is on omeprazole- as long as the takes this BID she is ok.  She has been on the omeprazole for some time She does not see OBG She had a hysterectomy- age 83.  She did not have any cancers but she had dysplasia and endometriosis.  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   Her right ovary involuted She has been on HRT for about 6 months. She does feel like this helps her mood and would like to continue using this  Lab Results  Component Value Date   TSH 7.62 (A) 06/15/2017   I am able to view her recnet [facility] labs through care everywhere-  TSH 6/12- 7.62 Lipids 06/14/17: Total 271 Tri 179 HDL 53 LDL 182  [name] also notes that her BP has been high on last several checks BP  06/08/17 148/98  She is still using  xeljanz for her RA  She plans to do a gastric sleeve procedure for her obesity- she is going through the [employer] bariatric program She does not have a surgery date but they plan for august  BP Readings from Last 3 Encounters:  06/19/17 (!) 150/100  02/24/17 (!) 138/98  01/16/17 (!) 139/41   Wt Readings from Last 3 Encounters:  06/19/17 (!) 311 lb (141.1 kg)  02/24/17 300 lb (136.1 kg)  01/16/17 (!) 310 lb (140.6 kg)   There is a family history of HTN  Her mom had HTN Father died of MI   Patient Active Problem List   Diagnosis Date Noted  . Plantar fasciitis of left foot 01/17/2017  . Left knee pain 09/01/2016  . Obesity 01/14/2016  . Rheumatoid arthritis (HCC) 01/14/2016  . Hypothyroidism due to acquired atrophy of thyroid 01/14/2016  . Hormone replacement therapy 01/14/2016  . Osteopenia 09/15/2015  . Bipolar 1 disorder (HCC) 08/21/2015  . Gastroesophageal reflux disease without esophagitis 08/21/2015  . Migraine 08/21/2015    Past Medical History:  Diagnosis Date  . Depression   . Thyroid disease   . Ulcer     Past Surgical History:  Procedure Laterality Date  . ABDOMINAL HYSTERECTOMY    . CHOLECYSTECTOMY    .  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 68    Social History   Tobacco Use  . Smoking status: Current Every Day Smoker    Packs/day: 0.50    Years: 20.00    Pack years: 10.00  . Smokeless tobacco: Never Used  Substance Use Topics  . Alcohol use: No  . Drug use: No    No family history on file.  Allergies  Allergen Reactions  . Sulfa Antibiotics Hives    Medication list has been reviewed and updated.  Current Outpatient Medications on File Prior to Visit  Medication Sig Dispense Refill  . alendronate (FOSAMAX) 70 MG tablet Take 70 mg by mouth.    Marland Kitchen amitriptyline (ELAVIL) 50 MG tablet TAKE 1 TABLET BY MOUTH EVERY EVENING 90 tablet 0  . amphetamine-dextroamphetamine (ADDERALL XR) 20 MG 24 hr capsule Take 1 capsule by mouth daily.    . ARIPiprazole  (ABILIFY) 15 MG tablet Take 15 mg by mouth daily.    . cholecalciferol (VITAMIN D) 1000 UNITS tablet Take 1,000 Units by mouth daily.    . Cyanocobalamin (VITAMIN B12 PO) Take 1 tablet by mouth daily.    . diclofenac (VOLTAREN) 25 MG EC tablet Take 25 mg by mouth.    Marland Kitchen omeprazole (PRILOSEC) 40 MG capsule Take 1 capsule (40 mg total) by mouth 2 (two) times daily before a meal. 180 capsule 3  . omeprazole (PRILOSEC) 40 MG capsule TAKE 1 CAPSULE BY MOUTH 2 TIMES DAILY 180 capsule 3  . predniSONE (DELTASONE) 20 MG tablet Take 2 tablets (40 mg total) by mouth daily with breakfast.    . QUEtiapine (SEROQUEL) 25 MG tablet Take 1 and 1/2 daily    . rizatriptan (MAXALT-MLT) 10 MG disintegrating tablet TAKE 1 TABLET BY MOUTH AT ONSET OF HEADACHE MAY REPEAT ONCE IN 2 HOURS IF NEEDED 12 tablet 0  . senna (SENOKOT) 8.6 MG tablet Take 1 tablet by mouth daily.    . Tofacitinib Citrate 5 MG TABS Take 2 tablets by mouth daily.     No current facility-administered medications on file prior to visit.     Review of Systems:  As per HPI- otherwise negative.   Physical Examination: Vitals:   06/19/17 1216  BP: (!) 150/100  Pulse: 78  Resp: 16  Temp: 98.5 F (36.9 C)  SpO2: 98%   Vitals:   06/19/17 1216  Weight: (!) 311 lb (141.1 kg)  Height: 5' 7.5" (1.715 m)   Body mass index is 47.99 kg/m. Ideal Body Weight: Weight in (lb) to have BMI = 25: 161.7  GEN: WDWN, NAD, Non-toxic, A & O x 3, obese, otherwise looks well  HEENT: Atraumatic, Normocephalic. Neck supple. No masses, No LAD, oropharynx normal.  PEERL,EOMI.   Ears and Nose: No external deformity. CV: RRR, No M/G/R. No JVD. No thrill. No extra heart sounds. PULM: CTA B, no wheezes, crackles, rhonchi. No retractions. No resp. distress. No accessory muscle use. EXTR: No c/c/e NEURO Normal gait.  PSYCH: Normally interactive. Conversant. Not depressed or anxious appearing.  Calm demeanor.    Assessment and Plan: Essential hypertension -  Plan: lisinopril (PRINIVIL,ZESTRIL) 10 MG tablet  Hypothyroidism due to acquired atrophy of thyroid - Plan: levothyroxine (SYNTHROID, LEVOTHROID) 175 MCG tablet, TSH  Class 3 severe obesity with body mass index (BMI) of 45.0 to 49.9 in adult, unspecified obesity type, unspecified whether serious comorbidity present (HCC)  Dyslipidemia  Need to adjust her thyroid replacement Increase from 150 to 175 mcg Advised that we will  need to repeat a TSH in once month, and also check her TSH on a regular basis while she is losing weight as weight change will alter her thyroid needs Start on 10 mg of lisinopril for BP control Discussed cholesterol meds- she would like to see how she responds to weight loss prior to starting on medication which is reasonable.  Will plan to repeat her CHL after her weight loss operation   Signed Abbe Amsterdam, MD

## 2017-06-19 ENCOUNTER — Encounter: Payer: Self-pay | Admitting: Family Medicine

## 2017-06-19 ENCOUNTER — Encounter: Payer: Self-pay | Admitting: *Deleted

## 2017-06-19 ENCOUNTER — Ambulatory Visit: Payer: BLUE CROSS/BLUE SHIELD | Admitting: Family Medicine

## 2017-06-19 VITALS — BP 150/100 | HR 78 | Temp 98.5°F | Resp 16 | Ht 67.5 in | Wt 311.0 lb

## 2017-06-19 DIAGNOSIS — Z6841 Body Mass Index (BMI) 40.0 and over, adult: Secondary | ICD-10-CM

## 2017-06-19 DIAGNOSIS — I10 Essential (primary) hypertension: Secondary | ICD-10-CM | POA: Diagnosis not present

## 2017-06-19 DIAGNOSIS — E785 Hyperlipidemia, unspecified: Secondary | ICD-10-CM

## 2017-06-19 DIAGNOSIS — E034 Atrophy of thyroid (acquired): Secondary | ICD-10-CM | POA: Diagnosis not present

## 2017-06-19 MED ORDER — LEVOTHYROXINE SODIUM 175 MCG PO TABS: 150.0000 ug | ORAL_TABLET | Freq: Every day | ORAL | 6 refills | Status: DC

## 2017-06-19 MED ORDER — LISINOPRIL 10 MG PO TABS: 10.0000 mg | ORAL_TABLET | Freq: Every day | ORAL | 3 refills | Status: DC

## 2017-06-19 NOTE — Patient Instructions (Addendum)
Great to see you today and best of luck with your upcoming weight loss operation  We are going to increase your levothyroxine from 150 to 175 mcg.  Please have a repeat TSH in about one month. We may need to adjust your medication more based on your next TSH As you are losing weight we will need to monitor your TSH; I put in a standing order for TSH for you to have done monthly as needed  For high BP, we are going to add 10 mg of lisinopril once a day We will not treat your high cholesterol for now, but will want to check it a few months after your operation. If not improving we may need to consider a cholesterol medication for you  Please see me 4-8 weeks after your operation for a recheck

## 2017-06-23 ENCOUNTER — Other Ambulatory Visit: Payer: Self-pay | Admitting: Family Medicine

## 2017-06-23 DIAGNOSIS — G43909 Migraine, unspecified, not intractable, without status migrainosus: Secondary | ICD-10-CM

## 2017-07-05 DIAGNOSIS — I10 Essential (primary) hypertension: Secondary | ICD-10-CM | POA: Diagnosis not present

## 2017-07-05 DIAGNOSIS — M069 Rheumatoid arthritis, unspecified: Secondary | ICD-10-CM | POA: Diagnosis not present

## 2017-07-05 DIAGNOSIS — M2550 Pain in unspecified joint: Secondary | ICD-10-CM | POA: Diagnosis not present

## 2017-07-05 DIAGNOSIS — Z6841 Body Mass Index (BMI) 40.0 and over, adult: Secondary | ICD-10-CM | POA: Diagnosis not present

## 2017-07-05 DIAGNOSIS — F988 Other specified behavioral and emotional disorders with onset usually occurring in childhood and adolescence: Secondary | ICD-10-CM | POA: Diagnosis not present

## 2017-07-05 DIAGNOSIS — Z01818 Encounter for other preprocedural examination: Secondary | ICD-10-CM | POA: Diagnosis not present

## 2017-07-05 DIAGNOSIS — F319 Bipolar disorder, unspecified: Secondary | ICD-10-CM | POA: Diagnosis not present

## 2017-07-05 DIAGNOSIS — R5383 Other fatigue: Secondary | ICD-10-CM | POA: Diagnosis not present

## 2017-07-05 DIAGNOSIS — Z7982 Long term (current) use of aspirin: Secondary | ICD-10-CM | POA: Diagnosis not present

## 2017-07-05 DIAGNOSIS — M549 Dorsalgia, unspecified: Secondary | ICD-10-CM | POA: Diagnosis not present

## 2017-07-05 DIAGNOSIS — E039 Hypothyroidism, unspecified: Secondary | ICD-10-CM | POA: Diagnosis not present

## 2017-07-05 DIAGNOSIS — G4733 Obstructive sleep apnea (adult) (pediatric): Secondary | ICD-10-CM | POA: Diagnosis not present

## 2017-07-05 DIAGNOSIS — E785 Hyperlipidemia, unspecified: Secondary | ICD-10-CM | POA: Diagnosis not present

## 2017-07-05 DIAGNOSIS — K219 Gastro-esophageal reflux disease without esophagitis: Secondary | ICD-10-CM | POA: Diagnosis not present

## 2017-07-05 DIAGNOSIS — Z79899 Other long term (current) drug therapy: Secondary | ICD-10-CM | POA: Diagnosis not present

## 2017-07-10 DIAGNOSIS — Z713 Dietary counseling and surveillance: Secondary | ICD-10-CM | POA: Diagnosis not present

## 2017-07-10 DIAGNOSIS — Z6841 Body Mass Index (BMI) 40.0 and over, adult: Secondary | ICD-10-CM | POA: Diagnosis not present

## 2017-07-11 DIAGNOSIS — Z6841 Body Mass Index (BMI) 40.0 and over, adult: Secondary | ICD-10-CM | POA: Diagnosis not present

## 2017-07-11 DIAGNOSIS — F54 Psychological and behavioral factors associated with disorders or diseases classified elsewhere: Secondary | ICD-10-CM | POA: Diagnosis not present

## 2017-07-11 DIAGNOSIS — Z7189 Other specified counseling: Secondary | ICD-10-CM | POA: Diagnosis not present

## 2017-07-19 DIAGNOSIS — Z6841 Body Mass Index (BMI) 40.0 and over, adult: Secondary | ICD-10-CM | POA: Diagnosis not present

## 2017-07-19 DIAGNOSIS — Z713 Dietary counseling and surveillance: Secondary | ICD-10-CM | POA: Diagnosis not present

## 2017-07-27 DIAGNOSIS — F988 Other specified behavioral and emotional disorders with onset usually occurring in childhood and adolescence: Secondary | ICD-10-CM | POA: Diagnosis not present

## 2017-07-27 DIAGNOSIS — Z79899 Other long term (current) drug therapy: Secondary | ICD-10-CM | POA: Diagnosis not present

## 2017-07-27 DIAGNOSIS — F3160 Bipolar disorder, current episode mixed, unspecified: Secondary | ICD-10-CM | POA: Diagnosis not present

## 2017-07-27 DIAGNOSIS — K219 Gastro-esophageal reflux disease without esophagitis: Secondary | ICD-10-CM | POA: Diagnosis not present

## 2017-08-22 ENCOUNTER — Encounter: Payer: Self-pay | Admitting: Family Medicine

## 2017-08-22 DIAGNOSIS — Z1231 Encounter for screening mammogram for malignant neoplasm of breast: Secondary | ICD-10-CM | POA: Diagnosis not present

## 2017-09-08 ENCOUNTER — Other Ambulatory Visit (INDEPENDENT_AMBULATORY_CARE_PROVIDER_SITE_OTHER): Payer: BLUE CROSS/BLUE SHIELD

## 2017-09-08 DIAGNOSIS — E034 Atrophy of thyroid (acquired): Secondary | ICD-10-CM

## 2017-09-08 NOTE — Addendum Note (Signed)
Addended by: Harley Alto on: 09/08/2017 04:43 PM   Modules accepted: Orders

## 2017-09-08 NOTE — Addendum Note (Signed)
Addended by: Harley Alto on: 09/08/2017 07:31 AM   Modules accepted: Orders

## 2017-09-09 ENCOUNTER — Encounter: Payer: Self-pay | Admitting: Family Medicine

## 2017-09-09 LAB — CBC
HEMATOCRIT: 38.6 % (ref 35.0–45.0)
Hemoglobin: 12.7 g/dL (ref 11.7–15.5)
MCH: 30.5 pg (ref 27.0–33.0)
MCHC: 32.9 g/dL (ref 32.0–36.0)
MCV: 92.6 fL (ref 80.0–100.0)
MPV: 10.3 fL (ref 7.5–12.5)
PLATELETS: 304 10*3/uL (ref 140–400)
RBC: 4.17 10*6/uL (ref 3.80–5.10)
RDW: 12.1 % (ref 11.0–15.0)
WBC: 6.5 10*3/uL (ref 3.8–10.8)

## 2017-09-09 LAB — COMPREHENSIVE METABOLIC PANEL
AG Ratio: 1.5 (calc) (ref 1.0–2.5)
ALBUMIN MSPROF: 4.2 g/dL (ref 3.6–5.1)
ALT: 25 U/L (ref 6–29)
AST: 20 U/L (ref 10–35)
Alkaline phosphatase (APISO): 82 U/L (ref 33–115)
BUN: 14 mg/dL (ref 7–25)
CHLORIDE: 104 mmol/L (ref 98–110)
CO2: 26 mmol/L (ref 20–32)
CREATININE: 0.97 mg/dL (ref 0.50–1.10)
Calcium: 9.8 mg/dL (ref 8.6–10.2)
GLUCOSE: 80 mg/dL (ref 65–99)
Globulin: 2.8 g/dL (calc) (ref 1.9–3.7)
POTASSIUM: 4.3 mmol/L (ref 3.5–5.3)
SODIUM: 141 mmol/L (ref 135–146)
TOTAL PROTEIN: 7 g/dL (ref 6.1–8.1)
Total Bilirubin: 0.3 mg/dL (ref 0.2–1.2)

## 2017-09-11 LAB — T4: T4, Total: 11.4 ug/dL (ref 5.1–11.9)

## 2017-09-11 LAB — TSH: TSH: 0.28 mIU/L — ABNORMAL LOW

## 2017-09-11 NOTE — Progress Notes (Signed)
[facility] at [facility] [address] [place], [place] [address] [phone]  Date:  09/13/2017   Name:  Grace Butler   DOB:  September 30, 1970   MRN:  [id number]  PCP:  Pearline Cables, MD    Chief Complaint: Weight Loss (Pt here to discuss weight loss. Flu vaccine today. )   History of Present Illness:  Grace Butler is a 47 y.o. very pleasant female patient who presents with the following:  Here today to discuss weight loss History of RA, hypothyroidism, bipolar disorder When I saw her in July she was pursuing gastric sleeve through [facility]  Markasia saw her bariatric doctor recently -she is getting a bit nervous, and they were thinking of other options for weight loss that she wanted to discuss   We spoke today about various medications.  She does have bipolar disorder so would not want to use contrave.  belviq also may put her at risk for serotonin syndrome in concert with her other meds.  In the end we decided that available weight loss medication are unlikely to offer enough benefit to offset potential risks  She thinks that she will likely end up going through with bariatric surgery after all   She has lived in this area her whole life. 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 Her job is busy but fulfilling In her free time - she does not have much- she enjoys reading, yard work, home renovations She is married to [place] History ofobesity, RA. She is on Harriette Ohara that does help with her RAsx.  Psychiatrist:Teresa Claybon Jabs, PA-C at [facility]. 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 She is on omeprazole- as long as the takes this BID she is ok. She has been on the omeprazole for some time She does not see OBG She had a hysterectomy- age 4. She did not have any cancers but she had  dysplasia and endometriosis. She had a teratoma removed at age 5- it was attached to her left ovary/ tube.  Her right ovary involuted  We have been adjusting her thyroid dose- in June her TSH was high so I increased her dosage from 150 to 175- however her repeat TSH is now low so will need to adjust again   Flu shot today  She is otherwise doing well Mood is good   Lab Results  Component Value Date   TSH 0.28 (L) 09/08/2017    Patient Active Problem List   Diagnosis Date Noted  . Plantar fasciitis of left foot 01/17/2017  . Left knee pain 09/01/2016  . Obesity 01/14/2016  . Rheumatoid arthritis (HCC) 01/14/2016  . Hypothyroidism due to acquired atrophy of thyroid 01/14/2016  . Hormone replacement therapy 01/14/2016  . Osteopenia 09/15/2015  . Bipolar 1 disorder (HCC) 08/21/2015  . Gastroesophageal reflux disease without esophagitis 08/21/2015  . Migraine 08/21/2015    Past Medical History:  Diagnosis Date  . Depression   . Thyroid disease   . Ulcer     Past Surgical History:  Procedure Laterality Date  . ABDOMINAL HYSTERECTOMY    . CHOLECYSTECTOMY    . 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 64    Social History   Tobacco Use  . Smoking status: Current Every Day Smoker    Packs/day: 0.50  Years: 20.00    Pack years: 10.00  . Smokeless tobacco: Never Used  Substance Use Topics  . Alcohol use: No  . Drug use: No    No family history on file.  Allergies  Allergen Reactions  . Sulfa Antibiotics Hives    Medication list has been reviewed and updated.  Current Outpatient Medications on File Prior to Visit  Medication Sig Dispense Refill  . alendronate (FOSAMAX) 70 MG tablet Take 70 mg by mouth.    Marland Kitchen amitriptyline (ELAVIL) 50 MG tablet TAKE 1 TABLET BY MOUTH EVERY EVENING 90 tablet 1  . amphetamine-dextroamphetamine (ADDERALL XR) 20 MG 24 hr capsule Take 1 capsule by mouth daily.    . ARIPiprazole (ABILIFY) 15 MG tablet Take 15 mg by mouth daily.    .  cholecalciferol (VITAMIN D) 1000 UNITS tablet Take 1,000 Units by mouth daily.    . diclofenac (VOLTAREN) 25 MG EC tablet Take 25 mg by mouth.    Marland Kitchen lisinopril (PRINIVIL,ZESTRIL) 10 MG tablet Take 1 tablet (10 mg total) by mouth daily. 90 tablet 3  . omeprazole (PRILOSEC) 40 MG capsule TAKE 1 CAPSULE BY MOUTH 2 TIMES DAILY 180 capsule 3  . QUEtiapine (SEROQUEL) 25 MG tablet Take 1 and 1/2 daily    . rizatriptan (MAXALT-MLT) 10 MG disintegrating tablet TAKE 1 TABLET BY MOUTH AT ONSET OF HEADACHE MAY REPEAT ONCE IN 2 HOURS IF NEEDED 12 tablet 0  . senna (SENOKOT) 8.6 MG tablet Take 1 tablet by mouth daily.    . Tofacitinib Citrate 5 MG TABS Take 1 tablet by mouth 2 (two) times daily.      No current facility-administered medications on file prior to visit.     Review of Systems:  As per HPI- otherwise negative. No fever or chills No CP or SOB    Physical Examination: Vitals:   09/13/17 1228  BP: 120/82  Pulse: 83  Temp: 98.3 F (36.8 C)  SpO2: 99%   Vitals:   09/13/17 1228  Weight: (!) 305 lb 12.8 oz (138.7 kg)  Height: 5\' 8"  (1.727 m)   Body mass index is 46.5 kg/m. Ideal Body Weight: Weight in (lb) to have BMI = 25: 164.1  GEN: WDWN, NAD, Non-toxic, A & O x 3, obese, looks well  HEENT: Atraumatic, Normocephalic. Neck supple. No masses, No LAD. Ears and Nose: No external deformity. CV: RRR, No M/G/R. No JVD. No thrill. No extra heart sounds. PULM: CTA B, no wheezes, crackles, rhonchi. No retractions. No resp. distress. No accessory muscle use. EXTR: No c/c/e NEURO Normal gait.  PSYCH: Normally interactive. Conversant. Not depressed or anxious appearing.  Calm demeanor.    Assessment and Plan: Hypothyroidism due to acquired atrophy of thyroid - Plan: levothyroxine (SYNTHROID, LEVOTHROID) 150 MCG tablet, levothyroxine (SYNTHROID, LEVOTHROID) 175 MCG tablet, TSH  Immunization due - Plan: Flu Vaccine QUAD 6+ mos PF IM (Fluarix Quad PF)  Class 3 severe obesity with body  mass index (BMI) of 45.0 to 49.9 in adult, unspecified obesity type, unspecified whether serious comorbidity present (HCC)  We decided to alternate 150 and 175 mcg thyroid replacement for her  She will come in for a TSH in one month Flu shot given discussed various weight management drugs in detail with pt/  In the end she will likely go ahead with surgery however She does have RA so weight loss will also be very beneficial to her joints   Signed , MD

## 2017-09-13 ENCOUNTER — Encounter: Payer: Self-pay | Admitting: Family Medicine

## 2017-09-13 ENCOUNTER — Ambulatory Visit: Payer: BLUE CROSS/BLUE SHIELD | Admitting: Family Medicine

## 2017-09-13 VITALS — BP 120/82 | HR 83 | Temp 98.3°F | Ht 68.0 in | Wt 305.8 lb

## 2017-09-13 DIAGNOSIS — E034 Atrophy of thyroid (acquired): Secondary | ICD-10-CM | POA: Diagnosis not present

## 2017-09-13 DIAGNOSIS — Z23 Encounter for immunization: Secondary | ICD-10-CM

## 2017-09-13 DIAGNOSIS — Z6841 Body Mass Index (BMI) 40.0 and over, adult: Secondary | ICD-10-CM

## 2017-09-13 MED ORDER — LEVOTHYROXINE SODIUM 150 MCG PO TABS: 150.0000 ug | ORAL_TABLET | Freq: Every day | ORAL | 3 refills | Status: DC

## 2017-09-13 MED ORDER — LEVOTHYROXINE SODIUM 175 MCG PO TABS: 175.0000 ug | ORAL_TABLET | Freq: Every day | ORAL | 6 refills | Status: DC

## 2017-09-13 NOTE — Patient Instructions (Signed)
It seems that we are caught between 2 doses of thyroid medication for you!  I am going to rx the 50 mcg pills- we will alternate between 150 and 175 mcg Take 3 pills one day and 3.5 pills the next- alternate days

## 2017-09-28 ENCOUNTER — Encounter: Payer: Self-pay | Admitting: *Deleted

## 2017-09-28 DIAGNOSIS — F909 Attention-deficit hyperactivity disorder, unspecified type: Secondary | ICD-10-CM | POA: Insufficient documentation

## 2017-09-28 DIAGNOSIS — G4700 Insomnia, unspecified: Secondary | ICD-10-CM | POA: Insufficient documentation

## 2017-10-04 DIAGNOSIS — K219 Gastro-esophageal reflux disease without esophagitis: Secondary | ICD-10-CM | POA: Diagnosis not present

## 2017-10-04 DIAGNOSIS — Z6841 Body Mass Index (BMI) 40.0 and over, adult: Secondary | ICD-10-CM | POA: Diagnosis not present

## 2017-10-04 DIAGNOSIS — F319 Bipolar disorder, unspecified: Secondary | ICD-10-CM | POA: Diagnosis not present

## 2017-10-10 ENCOUNTER — Encounter: Payer: Self-pay | Admitting: Family Medicine

## 2017-10-10 DIAGNOSIS — Z713 Dietary counseling and surveillance: Secondary | ICD-10-CM | POA: Diagnosis not present

## 2017-10-10 DIAGNOSIS — Z6841 Body Mass Index (BMI) 40.0 and over, adult: Secondary | ICD-10-CM | POA: Diagnosis not present

## 2017-10-12 ENCOUNTER — Ambulatory Visit: Payer: BLUE CROSS/BLUE SHIELD | Admitting: Physician Assistant

## 2017-10-12 ENCOUNTER — Encounter: Payer: Self-pay | Admitting: Physician Assistant

## 2017-10-12 VITALS — BP 108/86 | HR 76 | Wt 309.0 lb

## 2017-10-12 DIAGNOSIS — F901 Attention-deficit hyperactivity disorder, predominantly hyperactive type: Secondary | ICD-10-CM | POA: Diagnosis not present

## 2017-10-12 DIAGNOSIS — F40243 Fear of flying: Secondary | ICD-10-CM | POA: Diagnosis not present

## 2017-10-12 DIAGNOSIS — F319 Bipolar disorder, unspecified: Secondary | ICD-10-CM

## 2017-10-12 MED ORDER — AMPHETAMINE-DEXTROAMPHET ER 20 MG PO CP24: 20.0000 mg | ORAL_CAPSULE | Freq: Every day | ORAL | 0 refills | Status: DC

## 2017-10-12 MED ORDER — ARIPIPRAZOLE 10 MG PO TABS: 10.0000 mg | ORAL_TABLET | Freq: Every day | ORAL | 3 refills | Status: DC

## 2017-10-12 MED ORDER — ALPRAZOLAM 1 MG PO TABS: ORAL_TABLET | ORAL | 0 refills | Status: DC

## 2017-10-12 MED ORDER — QUETIAPINE FUMARATE 50 MG PO TABS: 50.0000 mg | ORAL_TABLET | Freq: Every day | ORAL | 3 refills | Status: DC

## 2017-10-12 NOTE — Progress Notes (Signed)
[facility] Med Check  Patient ID: Grace Butler,  MRN: [id number]  PCP: Pearline Cables, MD  Date of Evaluation: 10/12/2017 Time spent:15 minutes   HISTORY/CURRENT STATUS: HPI Here for routine med check. Doing really well with mood. Denies depressive sx. she is able to enjoy things.  Denies decreased energy and motivation.  No isolating.  Work is going really well.  She will be traveling a lot in the next month or so doing training all over the [place].  She has a fear of flying and requests Xanax to take before the flights. Denies increased energy with decreased need for sleep, no impulsivity or risky behavior.  No increased libido or spending.  No grandiosity. She is sleeps well as long as she has the Seroquel.  We have tried to wean off of that that she is unable to sleep at all so we have continued that med. She is planning bariatric surgery in December with a bariatric surgeon at [facility].  She will have her psychological evaluation for preop next week.  Her surgeon wanted me to know that because of the type of surgery he is doing she may have decreased absorption of her medications so we will watch for that in the future.   Individual Medical History/ Review of Systems: Changes? :No  Allergies: Coconut oil; Sulfa antibiotics; and Trazodone and nefazodone  Current Medications:  Current Outpatient Medications:  .  alendronate (FOSAMAX) 70 MG tablet, Take 70 mg by mouth., Disp: , Rfl:  .  ALPRAZolam (XANAX) 1 MG tablet, 1 po before flying prn, Disp: 10 tablet, Rfl: 0 .  amitriptyline (ELAVIL) 50 MG tablet, TAKE 1 TABLET BY MOUTH EVERY EVENING, Disp: 90 tablet, Rfl: 1 .  amphetamine-dextroamphetamine (ADDERALL XR) 20 MG 24 hr capsule, Take 1 capsule (20 mg total) by mouth daily., Disp: 30 capsule, Rfl: 0 .  [START ON 11/11/2017] amphetamine-dextroamphetamine (ADDERALL XR) 20 MG 24 hr capsule, Take 1 capsule (20 mg total) by mouth daily., Disp: 30 capsule, Rfl: 0 .  [START ON  12/10/2017] amphetamine-dextroamphetamine (ADDERALL XR) 20 MG 24 hr capsule, Take 1 capsule (20 mg total) by mouth daily., Disp: 30 capsule, Rfl: 0 .  ARIPiprazole (ABILIFY) 10 MG tablet, Take 1 tablet (10 mg total) by mouth daily., Disp: 90 tablet, Rfl: 3 .  cholecalciferol (VITAMIN D) 1000 UNITS tablet, Take 1,000 Units by mouth daily., Disp: , Rfl:  .  diclofenac (VOLTAREN) 25 MG EC tablet, Take 25 mg by mouth., Disp: , Rfl:  .  levothyroxine (SYNTHROID, LEVOTHROID) 150 MCG tablet, Take 1 tablet (150 mcg total) by mouth daily. Alternate days with 175 mcg, Disp: 90 tablet, Rfl: 3 .  levothyroxine (SYNTHROID, LEVOTHROID) 175 MCG tablet, Take 1 tablet (175 mcg total) by mouth daily. Alternate days with 150, Disp: 30 tablet, Rfl: 6 .  lisinopril (PRINIVIL,ZESTRIL) 10 MG tablet, Take 1 tablet (10 mg total) by mouth daily., Disp: 90 tablet, Rfl: 3 .  omeprazole (PRILOSEC) 40 MG capsule, TAKE 1 CAPSULE BY MOUTH 2 TIMES DAILY, Disp: 180 capsule, Rfl: 3 .  QUEtiapine (SEROQUEL) 50 MG tablet, Take 1-2 tablets (50-100 mg total) by mouth at bedtime., Disp: 180 tablet, Rfl: 3 .  rizatriptan (MAXALT-MLT) 10 MG disintegrating tablet, TAKE 1 TABLET BY MOUTH AT ONSET OF HEADACHE MAY REPEAT ONCE IN 2 HOURS IF NEEDED, Disp: 12 tablet, Rfl: 0 .  senna (SENOKOT) 8.6 MG tablet, Take 1 tablet by mouth daily., Disp: , Rfl:  .  Tofacitinib Citrate 5 MG TABS, Take 1  tablet by mouth 2 (two) times daily. , Disp: , Rfl:  Medication Side Effects: None  Family Medical/ Social History: Changes? No  MENTAL HEALTH EXAM:  Blood pressure 108/86, pulse 76, weight (!) 309 lb (140.2 kg).Body mass index is 46.98 kg/m.  General Appearance: Well Groomed obese  Eye Contact:  Good  Speech:  Clear and Coherent  Volume:  Normal  Mood:  Euthymic  Affect:  Appropriate  Thought Process:  Goal Directed  Orientation:  Full (Time, Place, and Person)  Thought Content: Logical   Suicidal Thoughts:  No  Homicidal Thoughts:  No  Memory:   Immediate  Judgement:  Good  Insight:  Good  Psychomotor Activity:  Normal  Concentration:  Concentration: Good  Recall:  Good  Fund of Knowledge: Good  Language: Good  Akathisia:  No  AIMS (if indicated): not done  Assets:  Communication Skills  ADL's:  Intact  Cognition: WNL  Prognosis:  Good    DIAGNOSES:    ICD-10-CM   1. Bipolar I disorder (HCC) F31.9   2. 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 Return in 3 months which will be approximately 1 month after her surgery.  We will see at that time whether we need to increase doses of medication or not.    Melony Overly, PA-C

## 2017-10-27 ENCOUNTER — Other Ambulatory Visit: Payer: Self-pay | Admitting: Family Medicine

## 2017-10-27 DIAGNOSIS — G43909 Migraine, unspecified, not intractable, without status migrainosus: Secondary | ICD-10-CM

## 2017-11-10 ENCOUNTER — Ambulatory Visit: Payer: BLUE CROSS/BLUE SHIELD | Admitting: Family Medicine

## 2017-11-10 ENCOUNTER — Ambulatory Visit (HOSPITAL_BASED_OUTPATIENT_CLINIC_OR_DEPARTMENT_OTHER)
Admission: RE | Admit: 2017-11-10 | Discharge: 2017-11-10 | Disposition: A | Discharge status: Home / Self Care | Payer: BLUE CROSS/BLUE SHIELD | Source: Ambulatory Visit | Attending: Family Medicine | Admitting: Family Medicine

## 2017-11-10 ENCOUNTER — Encounter: Payer: Self-pay | Admitting: Family Medicine

## 2017-11-10 VITALS — BP 128/81 | HR 62 | Temp 97.7°F | Resp 16 | Ht 68.0 in | Wt 310.0 lb

## 2017-11-10 DIAGNOSIS — Z23 Encounter for immunization: Secondary | ICD-10-CM | POA: Diagnosis not present

## 2017-11-10 DIAGNOSIS — S99921A Unspecified injury of right foot, initial encounter: Secondary | ICD-10-CM | POA: Diagnosis not present

## 2017-11-10 DIAGNOSIS — S90851A Superficial foreign body, right foot, initial encounter: Secondary | ICD-10-CM

## 2017-11-10 DIAGNOSIS — M79671 Pain in right foot: Secondary | ICD-10-CM

## 2017-11-10 NOTE — Progress Notes (Signed)
Patient ID: [name], female    DOB: 14-Mar-1970  Age: 47 y.o. MRN: [id number]    Subjective:  Subjective  HPI  Grace Butler presents for R heel /footbpain x several weeks -- hurts to walk She [place] foot at airport when getting off of moving walkway--- hyperflexed foot and she felt a pop and its hurt to walk on since She also c/o splinter in R heel -- she got when she was re doing deck and was bare foot .         Review of Systems  Constitutional: Negative for activity change, appetite change, fatigue and unexpected weight change.  Respiratory: Negative for cough and shortness of breath.   Cardiovascular: Negative for chest pain and palpitations.  Musculoskeletal:       Pt in R foot / heel  Psychiatric/Behavioral: Negative for behavioral problems and dysphoric mood. The patient is not nervous/anxious.     History Past Medical History:  Diagnosis Date  . Depression   . Thyroid disease   . Ulcer     She has a past surgical history that includes Cholecystectomy; Abdominal hysterectomy; and Teratoma excision.   Her family history is not on file.She reports that she has quit smoking. She has a 10.00 pack-year smoking history. She has never used smokeless tobacco. She reports that she drinks alcohol. She reports that she does not use drugs.  Current Outpatient Medications on File Prior to Visit  Medication Sig Dispense Refill  . alendronate (FOSAMAX) 70 MG tablet Take 70 mg by mouth.    . ALPRAZolam (XANAX) 1 MG tablet 1 po before flying prn 10 tablet 0  . amitriptyline (ELAVIL) 50 MG tablet TAKE 1 TABLET BY MOUTH EVERY EVENING 90 tablet 1  . amphetamine-dextroamphetamine (ADDERALL XR) 20 MG 24 hr capsule Take 1 capsule (20 mg total) by mouth daily. 30 capsule 0  . amphetamine-dextroamphetamine (ADDERALL XR) 20 MG 24 hr capsule Take 1 capsule (20 mg total) by mouth daily. 30 capsule 0  . [START ON 12/10/2017] amphetamine-dextroamphetamine (ADDERALL XR) 20 MG 24 hr capsule Take  1 capsule (20 mg total) by mouth daily. 30 capsule 0  . ARIPiprazole (ABILIFY) 10 MG tablet Take 1 tablet (10 mg total) by mouth daily. 90 tablet 3  . cholecalciferol (VITAMIN D) 1000 UNITS tablet Take 1,000 Units by mouth daily.    . diclofenac (VOLTAREN) 25 MG EC tablet Take 25 mg by mouth.    . levothyroxine (SYNTHROID, LEVOTHROID) 150 MCG tablet Take 1 tablet (150 mcg total) by mouth daily. Alternate days with 175 mcg 90 tablet 3  . levothyroxine (SYNTHROID, LEVOTHROID) 175 MCG tablet Take 1 tablet (175 mcg total) by mouth daily. Alternate days with 150 30 tablet 6  . lisinopril (PRINIVIL,ZESTRIL) 10 MG tablet Take 1 tablet (10 mg total) by mouth daily. 90 tablet 3  . omeprazole (PRILOSEC) 40 MG capsule TAKE 1 CAPSULE BY MOUTH 2 TIMES DAILY 180 capsule 3  . QUEtiapine (SEROQUEL) 50 MG tablet Take 1-2 tablets (50-100 mg total) by mouth at bedtime. 180 tablet 3  . rizatriptan (MAXALT-MLT) 10 MG disintegrating tablet TAKE 1 TABLET BY MOUTH AT ONSET OF HEADACHE MAY REPEAT ONCE IN 2 HOURS IF NEEDED 12 tablet 0  . senna (SENOKOT) 8.6 MG tablet Take 1 tablet by mouth daily.    . Tofacitinib Citrate 5 MG TABS Take 1 tablet by mouth 2 (two) times daily.      No current facility-administered medications on file prior to visit.  Objective:  Objective  Physical Exam  Constitutional: She is oriented to person, place, and time. She appears well-developed and well-nourished.  HENT:  Head: Normocephalic and atraumatic.  Eyes: Conjunctivae and EOM are normal.  Neck: Normal range of motion. Neck supple. No JVD present. Carotid bruit is not present. No thyromegaly present.  Cardiovascular: Normal rate, regular rhythm and normal heart sounds.  No murmur heard. Pulmonary/Chest: Effort normal and breath sounds normal. No respiratory distress. She has no wheezes. She has no rales. She exhibits no tenderness.  Musculoskeletal: She exhibits tenderness. She exhibits no edema.  R heel callous-- pt states  she has a splinter in her heel that is is unable to get out Unable to see foreign body today Tenderness in Heel --- and top of foot  No swelling, no errythema or bruising  Neurological: She is alert and oriented to person, place, and time.  Psychiatric: She has a normal mood and affect.  Nursing note and vitals reviewed.  BP 128/81 (BP Location: Left Arm, Patient Position: Sitting, Cuff Size: Large)   Pulse 62   Temp 97.7 F (36.5 C) (Oral)   Resp 16   Ht 5\' 8"  (1.727 m)   Wt (!) 310 lb (140.6 kg)   SpO2 100%   BMI 47.14 kg/m  Wt Readings from Last 3 Encounters:  11/10/17 (!) 310 lb (140.6 kg)  09/13/17 (!) 305 lb 12.8 oz (138.7 kg)  06/19/17 (!) 311 lb (141.1 kg)     Lab Results  Component Value Date   WBC 6.5 09/08/2017   HGB 12.7 09/08/2017   HCT 38.6 09/08/2017   PLT 304 09/08/2017   GLUCOSE 80 09/08/2017   CHOL 247 (H) 01/14/2016   TRIG 101.0 01/14/2016   HDL 55.10 01/14/2016   LDLCALC 171 (H) 01/14/2016   ALT 25 09/08/2017   AST 20 09/08/2017   NA 141 09/08/2017   K 4.3 09/08/2017   CL 104 09/08/2017   CREATININE 0.97 09/08/2017   BUN 14 09/08/2017   CO2 26 09/08/2017   TSH 0.28 (L) 09/08/2017   HGBA1C 5.5 01/14/2016    Dg Foot Complete Left  Result Date: 02/24/2017 CLINICAL DATA:  Felt pop in left foot with pain EXAM: LEFT FOOT - COMPLETE 3+ VIEW COMPARISON:  None. FINDINGS: No fracture or malalignment.  Soft tissues are unremarkable. IMPRESSION: No acute osseous abnormality Electronically Signed   By: 02/26/2017 M.D.   On: 02/24/2017 19:45     Assessment & Plan:  Plan  I am having Grace Butler maintain her cholecalciferol, diclofenac, Tofacitinib Citrate, senna, alendronate, omeprazole, rizatriptan, lisinopril, levothyroxine, levothyroxine, amphetamine-dextroamphetamine, amphetamine-dextroamphetamine, amphetamine-dextroamphetamine, QUEtiapine, ARIPiprazole, ALPRAZolam, and amitriptyline.  No orders of the defined types were placed in this  encounter.   Problem List Items Addressed This Visit    None    Visit Diagnoses    Right foot pain    -  Primary   Relevant Orders   DG Foot Complete Right (Completed)   Ambulatory referral to Podiatry   Acute foreign body of right heel, initial encounter       Relevant Orders   Ambulatory referral to Podiatry   Need for diphtheria-tetanus-pertussis (Tdap) vaccine       Relevant Orders   Tdap vaccine greater than or equal to 7yo IM (Completed)    soak foot in epson salt bath Refer to podiatry for both issues  rto prn Follow-up: Return if symptoms worsen or fail to improve.  02/26/2017, DO

## 2017-11-10 NOTE — Patient Instructions (Signed)
Foot Sprain A foot sprain is an injury to one of the strong bands of tissue (ligaments) that connect and support the many bones in your feet. The ligament can be stretched too much or it can tear. A tear can be either partial or complete. The severity of the sprain depends on how much of the ligament was damaged or torn. What are the causes? A foot sprain is usually caused by suddenly twisting or pivoting your foot. What increases the risk? This injury is more likely to occur in people who:  Play a sport, such as basketball or football.  Exercise or play a sport without warming up.  Start a new workout or sport.  Suddenly increase how long or hard they exercise or play a sport.  What are the signs or symptoms? Symptoms of this condition start soon after an injury and include:  Pain, especially in the arch of the foot.  Bruising.  Swelling.  Inability to walk or use the foot to support body weight.  How is this diagnosed? This condition is diagnosed with a medical history and physical exam. You may also have imaging tests, such as:  X-rays to make sure there are no broken bones (fractures).  MRI to see if the ligament has torn.  How is this treated? Treatment varies depending on the severity of your sprain. Mild sprains can be treated with rest, ice, compression, and elevation (RICE). If your ligament is overstretched or partially torn, treatment usually involves keeping your foot in a fixed position (immobilization) for a period of time. To help you do this, your health care provider will apply a bandage, splint, or walking boot to keep your foot from moving until it heals. You may also be advised to use crutches or a scooter for a few weeks to avoid bearing weight on your foot while it is healing. If your ligament is fully torn, you may need surgery to reconnect the ligament to the bone. After surgery, a cast or splint will be applied and will need to stay on your foot while it  heals. Your health care provider may also suggest exercises or physical therapy to strengthen your foot. Follow these instructions at home: If You Have a Bandage, Splint, or Walking Boot:  Wear it as directed by your health care provider. Remove it only as directed by your health care provider.  Loosen the bandage, splint, or walking boot if your toes become numb and tingle, or if they turn cold and blue. Bathing  If your health care provider approves bathing and showering, cover the bandage or splint with a watertight plastic bag to protect it from water. Do not let the bandage or splint get wet. Managing pain, stiffness, and swelling  If directed, apply ice to the injured area: ? Put ice in a plastic bag. ? Place a towel between your skin and the bag. ? Leave the ice on for 20 minutes, 2-3 times per day.  Move your toes often to avoid stiffness and to lessen swelling.  Raise (elevate) the injured area above the level of your heart while you are sitting or lying down. Driving  Do not drive or operate heavy machinery while taking pain medicine.  Ask your health care provider when it is safe to drive if you have a bandage, splint, or walking boot on your foot. Activity  Rest as directed by your health care provider.  Do not use the injured foot to support your body weight until your health   care provider says that you can. Use crutches or other supportive devices as directed by your health care provider.  Ask your health care provider what activities are safe for you. Gradually increase how much and how far you walk until your health care provider says it is safe to return to full activity.  Do any exercise or physical therapy as directed by your health care provider. General instructions  If a splint was applied, do not put pressure on any part of it until it is fully hardened. This may take several hours.  Take medicines only as directed by your health care provider. These  include over-the-counter medicines and prescription medicines.  Keep all follow-up visits as directed by your health care provider. This is important.  When you can walk without pain, wear supportive shoes that have stiff soles. Do not wear flip-flops, and do not walk barefoot. Contact a health care provider if:  Your pain is not controlled with medicine.  Your bruising or swelling gets worse or does not get better with treatment.  Your splint or walking boot is damaged. Get help right away if:  You develop severe numbness or tingling in your foot.  Your foot turns blue, white, or gray, and it feels cold. This information is not intended to replace advice given to you by your health care provider. Make sure you discuss any questions you have with your health care provider. Document Released: 06/11/2001 Document Revised: 05/28/2015 Document Reviewed: 10/23/2013 Elsevier Interactive Patient Education  2018 [employer]  

## 2017-11-17 ENCOUNTER — Encounter: Payer: Self-pay | Admitting: Podiatry

## 2017-11-17 ENCOUNTER — Ambulatory Visit: Payer: BLUE CROSS/BLUE SHIELD | Admitting: Podiatry

## 2017-11-17 ENCOUNTER — Ambulatory Visit: Payer: BLUE CROSS/BLUE SHIELD

## 2017-11-17 DIAGNOSIS — S90851A Superficial foreign body, right foot, initial encounter: Secondary | ICD-10-CM | POA: Diagnosis not present

## 2017-11-17 DIAGNOSIS — M7751 Other enthesopathy of right foot: Secondary | ICD-10-CM | POA: Diagnosis not present

## 2017-11-17 MED ORDER — METHYLPREDNISOLONE 4 MG PO TBPK: ORAL_TABLET | ORAL | 0 refills | Status: DC

## 2017-11-17 NOTE — Progress Notes (Signed)
Subjective:    Patient ID: Grace Butler, female    DOB: [birth year], 47 y.o.   MRN: [id number]  HPI 47 year old female presents the office today for concerns of numbness to the outside aspect of her right foot and on the bottom as well.,  2 weeks ago when she was in the airport on a moving sidewalk and stopped and she did not.  She states that she bent her toes back.  No recent treatment.  Some mild swelling.  She also thinks that she may have a splinter or something in her right heel.  She was painting her deck barefoot and afterwards she noticed a stabbing pain to her heel.  She has not seen any bleeding or open sores and she does not recall actually stepping on anything.  No recent treatment for this.   Review of Systems  All other systems reviewed and are negative.  Past Medical History:  Diagnosis Date  . Depression   . Thyroid disease   . Ulcer     Past Surgical History:  Procedure Laterality Date  . ABDOMINAL HYSTERECTOMY    . CHOLECYSTECTOMY    . 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 67     Current Outpatient Medications:  .  alendronate (FOSAMAX) 70 MG tablet, Take 70 mg by mouth., Disp: , Rfl:  .  ALPRAZolam (XANAX) 1 MG tablet, 1 po before flying prn, Disp: 10 tablet, Rfl: 0 .  amitriptyline (ELAVIL) 50 MG tablet, TAKE 1 TABLET BY MOUTH EVERY EVENING, Disp: 90 tablet, Rfl: 1 .  amphetamine-dextroamphetamine (ADDERALL XR) 20 MG 24 hr capsule, Take 1 capsule (20 mg total) by mouth daily., Disp: 30 capsule, Rfl: 0 .  [START ON 12/10/2017] amphetamine-dextroamphetamine (ADDERALL XR) 20 MG 24 hr capsule, Take 1 capsule (20 mg total) by mouth daily., Disp: 30 capsule, Rfl: 0 .  ARIPiprazole (ABILIFY) 10 MG tablet, Take 1 tablet (10 mg total) by mouth daily., Disp: 90 tablet, Rfl: 3 .  cholecalciferol (VITAMIN D) 1000 UNITS tablet, Take 1,000 Units by mouth daily., Disp: , Rfl:  .  diclofenac (VOLTAREN) 25 MG EC tablet, Take 25 mg by mouth., Disp: , Rfl:  .  levothyroxine (SYNTHROID,  LEVOTHROID) 150 MCG tablet, Take 1 tablet (150 mcg total) by mouth daily. Alternate days with 175 mcg, Disp: 90 tablet, Rfl: 3 .  levothyroxine (SYNTHROID, LEVOTHROID) 175 MCG tablet, Take 1 tablet (175 mcg total) by mouth daily. Alternate days with 150, Disp: 30 tablet, Rfl: 6 .  lisinopril (PRINIVIL,ZESTRIL) 10 MG tablet, Take 1 tablet (10 mg total) by mouth daily., Disp: 90 tablet, Rfl: 3 .  methylPREDNISolone (MEDROL DOSEPAK) 4 MG TBPK tablet, Take as directed, Disp: 21 tablet, Rfl: 0 .  omeprazole (PRILOSEC) 40 MG capsule, TAKE 1 CAPSULE BY MOUTH 2 TIMES DAILY, Disp: 180 capsule, Rfl: 3 .  QUEtiapine (SEROQUEL) 50 MG tablet, Take 1-2 tablets (50-100 mg total) by mouth at bedtime., Disp: 180 tablet, Rfl: 3 .  rizatriptan (MAXALT-MLT) 10 MG disintegrating tablet, TAKE 1 TABLET BY MOUTH AT ONSET OF HEADACHE MAY REPEAT ONCE IN 2 HOURS IF NEEDED, Disp: 12 tablet, Rfl: 0 .  senna (SENOKOT) 8.6 MG tablet, Take 1 tablet by mouth daily., Disp: , Rfl:  .  Tofacitinib Citrate 5 MG TABS, Take 1 tablet by mouth 2 (two) times daily. , Disp: , Rfl:   Allergies  Allergen Reactions  . Coconut Oil Anaphylaxis  . Sulfa Antibiotics Hives  . Trazodone And Nefazodone  Vivid dreams         Objective:   Physical Exam  General: AAO x3, NAD  Dermatological: To the lateral aspect the right heel is a hyperkeratotic lesion.  Upon debridement there is a very small area of what appeared to be a piece of glass but was very minimal.  There is no drainage or pus.  There is no fluctuation or crepitation or any malodor or any other signs of infection.  Vascular: Dorsalis Pedis artery and Posterior Tibial artery pedal pulses are 2/4 bilateral with immedate capillary fill time.  There is no pain with calf compression, swelling, warmth, erythema.   Neruologic: Grossly intact via light touch bilateral.Protective threshold with Semmes Wienstein monofilament intact to all pedal sites bilateral.   Musculoskeletal:  Tenderness along the hyperkeratotic lesion, possible formed right heel.  Some mild discomfort to the MPJs.  Subjectively she is given some numbness in the lateral aspect of her foot.  There is minimal edema there is no erythema or warmth.  Mild tenderness to the dorsal MPJs but there is no area pinpoint tenderness.  No pain to vibratory sensation.  Muscular strength 5/5 in all groups tested bilateral.  Gait: Unassisted, Nonantalgic.      Assessment & Plan:  47 year old female with capsulitis right MPJs/injury; possible foreign body right heel -Treatment options discussed including all alternatives, risks, and complications -Etiology of symptoms were discussed -X-rays were obtained and reviewed with the patient.  No definitive evidence of acute fracture or stress fracture identified today.  No evidence of foreign body identified. -Sharply debrided hyperkeratotic lesion to remove quite a bit of hyperkeratotic tissue.  A small piece of glass was removed and is very superficial.  Unable to palpate any further foreign body.  If symptoms continue we will get an ultrasound.  Monitoring signs or symptoms of infection. -Regards to right foot pain I prescribed a Medrol Dosepak and put him into a surgical shoe to help offload the area.  Ice and elevation and limit activity.  Vivi Barrack DPM

## 2017-12-16 ENCOUNTER — Encounter: Payer: Self-pay | Admitting: Family Medicine

## 2017-12-16 ENCOUNTER — Telehealth: Payer: BLUE CROSS/BLUE SHIELD | Admitting: Nurse Practitioner

## 2017-12-16 DIAGNOSIS — J029 Acute pharyngitis, unspecified: Secondary | ICD-10-CM

## 2017-12-16 NOTE — Progress Notes (Signed)

## 2017-12-18 ENCOUNTER — Encounter: Payer: Self-pay | Admitting: Family Medicine

## 2017-12-18 ENCOUNTER — Ambulatory Visit: Payer: BLUE CROSS/BLUE SHIELD | Admitting: Family Medicine

## 2017-12-18 VITALS — BP 112/76 | HR 79 | Temp 98.6°F | Ht 69.0 in | Wt 310.1 lb

## 2017-12-18 DIAGNOSIS — J0100 Acute maxillary sinusitis, unspecified: Secondary | ICD-10-CM

## 2017-12-18 MED ORDER — CEFDINIR 300 MG PO CAPS: 300.0000 mg | ORAL_CAPSULE | Freq: Two times a day (BID) | ORAL | 0 refills | Status: AC

## 2017-12-18 MED ORDER — METHYLPREDNISOLONE ACETATE 80 MG/ML IJ SUSP
80.0000 mg | Freq: Once | INTRAMUSCULAR | Status: AC
  Administered 2017-12-18: 80 mg via INTRAMUSCULAR

## 2017-12-18 NOTE — Addendum Note (Signed)
Addended by: Scharlene Gloss B on: 12/18/2017 10:43 AM   Modules accepted: Orders

## 2017-12-18 NOTE — Progress Notes (Signed)
Chief Complaint  Patient presents with  . Sinusitis    Grace Butler here for URI complaints.  Duration: 4 days  Associated symptoms: sinus congestion, sinus pain, rhinorrhea, ear pain, wheezing, chest tightness, cough and fatigue Denies: itchy watery eyes, ear drainage, sore throat, shortness of breath and fevers Treatment to date: Vick's Nasal spray, Sudafed, Nyquil Sick contacts: Yes  ROS:  Const: Denies fevers HEENT: As noted in HPI Lungs: +cough  Past Medical History:  Diagnosis Date  . Depression   . Thyroid disease   . Ulcer     BP 112/76 (BP Location: Left Arm, Patient Position: Sitting, Cuff Size: Large)   Pulse 79   Temp 98.6 F (37 C) (Oral)   Ht 5\' 9"  (1.753 m)   Wt (!) 310 lb 2 oz (140.7 kg)   SpO2 97%   BMI 45.80 kg/m  General: Awake, alert, appears stated age HEENT: AT, [place], ears patent b/l and TM's neg, nares patent w/o discharge, +TTP over max sinuses b//l; pharynx pink and without exudates, MMM Neck: No masses or asymmetry Heart: RRR Lungs: CTAB, no accessory muscle use Psych: Age appropriate judgment and insight, normal mood and affect  Acute maxillary sinusitis, recurrence not specified - Plan: cefdinir (OMNICEF) 300 MG capsule  Pocket rx. Depo shot for ETD.  Continue to push fluids, practice good hand hygiene, cover mouth when coughing. F/u prn. If starting to experience fevers, shaking, or shortness of breath, seek immediate care. Pt voiced understanding and agreement to the plan.  [place], DO 12/18/17 10:39 AM

## 2017-12-18 NOTE — Patient Instructions (Signed)
Continue to push fluids, practice good hand hygiene, and cover your mouth if you cough.  If you start having fevers, shaking or shortness of breath, seek immediate care.  Most sinus infections are viral in etiology and antibiotics will not be helpful. That being said, if you start having worsening symptoms over 3 days, you are worsening by day 10 or not improving by day 14, go ahead and take it. You are on Day 4 as of now.   Let us know if you need anything.  

## 2017-12-18 NOTE — Progress Notes (Signed)
Pre visit review using our clinic review tool, if applicable. No additional management support is needed unless otherwise documented below in the visit note. 

## 2018-01-04 ENCOUNTER — Encounter: Payer: Self-pay | Admitting: Family Medicine

## 2018-01-16 ENCOUNTER — Ambulatory Visit: Payer: BLUE CROSS/BLUE SHIELD | Admitting: Physician Assistant

## 2018-01-16 ENCOUNTER — Encounter: Payer: Self-pay | Admitting: Physician Assistant

## 2018-01-16 DIAGNOSIS — F319 Bipolar disorder, unspecified: Secondary | ICD-10-CM | POA: Diagnosis not present

## 2018-01-16 DIAGNOSIS — F40243 Fear of flying: Secondary | ICD-10-CM

## 2018-01-16 DIAGNOSIS — F902 Attention-deficit hyperactivity disorder, combined type: Secondary | ICD-10-CM | POA: Diagnosis not present

## 2018-01-16 DIAGNOSIS — G4700 Insomnia, unspecified: Secondary | ICD-10-CM | POA: Diagnosis not present

## 2018-01-16 MED ORDER — ARIPIPRAZOLE 15 MG PO TABS: 15.0000 mg | ORAL_TABLET | Freq: Every day | ORAL | 1 refills | Status: DC

## 2018-01-16 MED ORDER — AMPHETAMINE-DEXTROAMPHET ER 20 MG PO CP24: 20.0000 mg | ORAL_CAPSULE | Freq: Every day | ORAL | 0 refills | Status: DC

## 2018-01-16 MED ORDER — QUETIAPINE FUMARATE 50 MG PO TABS: 50.0000 mg | ORAL_TABLET | Freq: Every day | ORAL | 3 refills | Status: DC

## 2018-01-16 NOTE — Progress Notes (Signed)
[facility] Med Check  Patient ID: Grace Butler,  MRN: [id number]  PCP: Grace Cables, MD  Date of Evaluation: 01/16/2018 Time spent:15 minutes  Chief Complaint:   HISTORY/CURRENT STATUS: HPI Here for routine med check.   Was planning to have Gastric bypass but decided against it for now.  Is doing keto diet now and is trying to lose wt with that.  Also might end up having gastric sleeve at some point.   She has not used the Xanax at all.  She has flown several times for work and was able to use certain coping mechanisms to help with the anxiety.  Her only real complaint is increased irritability and decreased patience, with everyone but especially her wife Grace Butler.  This is been going on for a couple of months.  Little things will irritate her. Patient denies increased energy with decreased need for sleep, no increased talkativeness, no racing thoughts, no impulsivity or risky behaviors, no increased spending, no increased libido, no grandiosity.  Patient denies loss of interest in usual activities and is able to enjoy things.  Denies decreased energy or motivation.  Appetite has not changed.  No extreme sadness, tearfulness, or feelings of hopelessness.  Denies any changes in concentration, making decisions or remembering things.  Denies suicidal or homicidal thoughts.  Individual Medical History/ Review of Systems: Changes? :No    Past medications for mental health diagnoses include: Adderall XR, Seroquel, Abilify, trazodone, Serzone, Elavil, Xanax  Allergies: Coconut oil; Sulfa antibiotics; and Trazodone and nefazodone  Current Medications:  Current Outpatient Medications:  .  alendronate (FOSAMAX) 70 MG tablet, Take 70 mg by mouth., Disp: , Rfl:  .  ALPRAZolam (XANAX) 1 MG tablet, 1 po before flying prn, Disp: 10 tablet, Rfl: 0 .  amitriptyline (ELAVIL) 50 MG tablet, TAKE 1 TABLET BY MOUTH EVERY EVENING, Disp: 90 tablet, Rfl: 1 .  amphetamine-dextroamphetamine  (ADDERALL XR) 20 MG 24 hr capsule, Take 1 capsule (20 mg total) by mouth daily., Disp: 30 capsule, Rfl: 0 .  amphetamine-dextroamphetamine (ADDERALL XR) 20 MG 24 hr capsule, Take 1 capsule (20 mg total) by mouth daily., Disp: 30 capsule, Rfl: 0 .  ARIPiprazole (ABILIFY) 10 MG tablet, Take 1 tablet (10 mg total) by mouth daily., Disp: 90 tablet, Rfl: 3 .  cholecalciferol (VITAMIN D) 1000 UNITS tablet, Take 1,000 Units by mouth daily., Disp: , Rfl:  .  diclofenac (VOLTAREN) 25 MG EC tablet, Take 25 mg by mouth., Disp: , Rfl:  .  levothyroxine (SYNTHROID, LEVOTHROID) 150 MCG tablet, Take 1 tablet (150 mcg total) by mouth daily. Alternate days with 175 mcg, Disp: 90 tablet, Rfl: 3 .  levothyroxine (SYNTHROID, LEVOTHROID) 175 MCG tablet, Take 1 tablet (175 mcg total) by mouth daily. Alternate days with 150, Disp: 30 tablet, Rfl: 6 .  lisinopril (PRINIVIL,ZESTRIL) 10 MG tablet, Take 1 tablet (10 mg total) by mouth daily., Disp: 90 tablet, Rfl: 3 .  omeprazole (PRILOSEC) 40 MG capsule, TAKE 1 CAPSULE BY MOUTH 2 TIMES DAILY, Disp: 180 capsule, Rfl: 3 .  QUEtiapine (SEROQUEL) 50 MG tablet, Take 1-2 tablets (50-100 mg total) by mouth at bedtime., Disp: 180 tablet, Rfl: 3 .  rizatriptan (MAXALT-MLT) 10 MG disintegrating tablet, TAKE 1 TABLET BY MOUTH AT ONSET OF HEADACHE MAY REPEAT ONCE IN 2 HOURS IF NEEDED, Disp: 12 tablet, Rfl: 0 .  senna (SENOKOT) 8.6 MG tablet, Take 1 tablet by mouth daily., Disp: , Rfl:  .  Tofacitinib Citrate 5 MG TABS, Take 1 tablet by  mouth 2 (two) times daily. , Disp: , Rfl:  Medication Side Effects: none  Family Medical/ Social History: Changes? No  MENTAL HEALTH EXAM:  There were no vitals taken for this visit.There is no height or weight on file to calculate BMI.  General Appearance: Casual, Well Groomed and Obese  Eye Contact:  Good  Speech:  Clear and Coherent  Volume:  Normal  Mood:  Euthymic  Affect:  Appropriate  Thought Process:  Goal Directed  Orientation:  Full  (Time, Place, and Person)  Thought Content: NA and Logical   Suicidal Thoughts:  No  Homicidal Thoughts:  No  Memory:  WNL  Judgement:  Good  Insight:  Good  Psychomotor Activity:  Normal  Concentration:  Concentration: Good  Recall:  Good  Fund of Knowledge: Good  Language: Good  Assets:  Desire for Improvement  ADL's:  Intact  Cognition: WNL  Prognosis:  Good  See labs on chart from 09/08/2017 glucose was 80, BUN and creatinine were normal, LFTs were normal.  Last lipid panel was in January 2019.  Will order that at next visit.  DIAGNOSES: No diagnosis found.  Receiving Psychotherapy: No    RECOMMENDATIONS: Increase Abilify to 15 mg. Continue Adderall XR 20 mg every morning. Continue Seroquel 50 to 100 mg nightly as needed sleep Continue Elavil, Maxalt per PCP, and all other meds. She will let me know if the Abilify 15 mg is working well, and I can prescribe a 38-month supply when it is due.   Return in 6 months or sooner as needed.  Melony Overly, PA-C

## 2018-03-01 DIAGNOSIS — M069 Rheumatoid arthritis, unspecified: Secondary | ICD-10-CM | POA: Diagnosis not present

## 2018-03-01 DIAGNOSIS — M0600 Rheumatoid arthritis without rheumatoid factor, unspecified site: Secondary | ICD-10-CM | POA: Diagnosis not present

## 2018-03-14 ENCOUNTER — Other Ambulatory Visit: Payer: Self-pay | Admitting: Physician Assistant

## 2018-03-16 ENCOUNTER — Other Ambulatory Visit: Payer: Self-pay | Admitting: Family Medicine

## 2018-04-14 ENCOUNTER — Other Ambulatory Visit: Payer: Self-pay | Admitting: Physician Assistant

## 2018-04-28 ENCOUNTER — Other Ambulatory Visit: Payer: Self-pay | Admitting: Family Medicine

## 2018-04-28 DIAGNOSIS — G43909 Migraine, unspecified, not intractable, without status migrainosus: Secondary | ICD-10-CM

## 2018-05-12 ENCOUNTER — Other Ambulatory Visit: Payer: Self-pay | Admitting: Physician Assistant

## 2018-05-14 ENCOUNTER — Other Ambulatory Visit: Payer: Self-pay

## 2018-05-14 MED ORDER — AMPHETAMINE-DEXTROAMPHET ER 20 MG PO CP24: 20.0000 mg | ORAL_CAPSULE | Freq: Every day | ORAL | 0 refills | Status: DC

## 2018-05-14 NOTE — Telephone Encounter (Signed)
Next appt 07/14

## 2018-06-19 DIAGNOSIS — M8589 Other specified disorders of bone density and structure, multiple sites: Secondary | ICD-10-CM | POA: Diagnosis not present

## 2018-06-25 ENCOUNTER — Other Ambulatory Visit: Payer: Self-pay | Admitting: Family Medicine

## 2018-06-25 DIAGNOSIS — I10 Essential (primary) hypertension: Secondary | ICD-10-CM

## 2018-07-10 ENCOUNTER — Other Ambulatory Visit: Payer: Self-pay | Admitting: Physician Assistant

## 2018-07-17 ENCOUNTER — Encounter: Payer: Self-pay | Admitting: Physician Assistant

## 2018-07-17 ENCOUNTER — Ambulatory Visit (INDEPENDENT_AMBULATORY_CARE_PROVIDER_SITE_OTHER): Payer: BC Managed Care – PPO | Admitting: Physician Assistant

## 2018-07-17 ENCOUNTER — Other Ambulatory Visit: Payer: Self-pay

## 2018-07-17 DIAGNOSIS — G43909 Migraine, unspecified, not intractable, without status migrainosus: Secondary | ICD-10-CM

## 2018-07-17 DIAGNOSIS — F902 Attention-deficit hyperactivity disorder, combined type: Secondary | ICD-10-CM

## 2018-07-17 DIAGNOSIS — F319 Bipolar disorder, unspecified: Secondary | ICD-10-CM | POA: Diagnosis not present

## 2018-07-17 DIAGNOSIS — G4700 Insomnia, unspecified: Secondary | ICD-10-CM

## 2018-07-17 DIAGNOSIS — F40243 Fear of flying: Secondary | ICD-10-CM

## 2018-07-17 MED ORDER — AMPHETAMINE-DEXTROAMPHET ER 25 MG PO CP24: 25.0000 mg | ORAL_CAPSULE | ORAL | 0 refills | Status: DC

## 2018-07-17 MED ORDER — AMITRIPTYLINE HCL 50 MG PO TABS: 50.0000 mg | ORAL_TABLET | Freq: Every evening | ORAL | 1 refills | Status: DC

## 2018-07-17 MED ORDER — ARIPIPRAZOLE 15 MG PO TABS: 15.0000 mg | ORAL_TABLET | Freq: Every day | ORAL | 1 refills | Status: DC

## 2018-07-17 NOTE — Progress Notes (Signed)
[facility]  Patient ID: Grace Butler,  MRN: [id number]  PCP: Pearline Cables, MD  Date of Evaluation: 07/17/2018 Time spent:15 minutes  Chief Complaint:  Chief Complaint    Follow-up     Virtual Visit via Telephone Note  I connected with patient by a video enabled telemedicine application or telephone, with their informed consent, and verified patient privacy and that I am speaking with the correct person using two identifiers.  I am private, in my home and the patient is work.  I discussed the limitations, risks, security and privacy concerns of performing an evaluation and management service by telephone and the availability of in person appointments. I also discussed with the patient that there may be a patient responsible charge related to this service. The patient expressed understanding and agreed to proceed.   I discussed the assessment and treatment plan with the patient. The patient was provided an opportunity to ask questions and all were answered. The patient agreed with the plan and demonstrated an understanding of the instructions.   The patient was advised to call back or seek an in-person evaluation if the symptoms worsen or if the condition fails to improve as anticipated.  I provided 15 minutes of non-face-to-face time during this encounter.  HISTORY/CURRENT STATUS: HPI For routine med check.   Doing really well overall.  Doesn't feel like the Adderall is strong enough anymore though.  She is been on the same dose for a couple of years.  Feels like it does not "kick in" like it used to.  Gets distracted easily even within a few hours after she takes it.  Has trouble staying on task at work.  Work is been super busy lately because they are short handed.  She is working a lot of overtime right now so she needs to be very focused.  She sleeps well.  Denies increased energy with decreased need for sleep.  No impulsivity or risky behavior.  No increased  libido, increased spending, or grandiosity.  Denies hallucinations.  Patient denies loss of interest in usual activities and is able to enjoy things.  Denies decreased energy or motivation.  Appetite has not changed.  No extreme sadness, tearfulness, or feelings of hopelessness.  Denies any changes in concentration, making decisions or remembering things.  Denies suicidal or homicidal thoughts.  She has not had to use the Xanax at all since her last visit.  She usually gets extremely nervous about flying but has blown a few times and did not take the Xanax.  She tolerated it fine.  She still has all of the pills from her last prescription.  Denies dizziness, syncope, seizures, numbness, tingling, tremor, tics, unsteady gait, slurred speech, confusion. Denies muscle or joint pain, stiffness, or dystonia.  Individual Medical History/ Review of Systems: Changes? :Yes New diagnosis of osteopenia now on Fosamax once a week.  Past medications for mental health diagnoses include: Adderall XR, Seroquel, Abilify, trazodone, Serzone, Elavil, Xanax  Allergies: Coconut oil, Sulfa antibiotics, and Trazodone and nefazodone  Current Medications:  Current Outpatient Medications:  .  alendronate (FOSAMAX) 70 MG tablet, Take 70 mg by mouth., Disp: , Rfl:  .  ALPRAZolam (XANAX) 1 MG tablet, 1 po before flying prn, Disp: 10 tablet, Rfl: 0 .  amitriptyline (ELAVIL) 50 MG tablet, Take 1 tablet (50 mg total) by mouth every evening., Disp: 90 tablet, Rfl: 1 .  ARIPiprazole (ABILIFY) 15 MG tablet, Take 1 tablet (15 mg total) by mouth daily., Disp:  90 tablet, Rfl: 1 .  cholecalciferol (VITAMIN D) 1000 UNITS tablet, Take 1,000 Units by mouth daily., Disp: , Rfl:  .  diclofenac (VOLTAREN) 25 MG EC tablet, Take 25 mg by mouth., Disp: , Rfl:  .  levothyroxine (SYNTHROID, LEVOTHROID) 150 MCG tablet, Take 1 tablet (150 mcg total) by mouth daily. Alternate days with 175 mcg, Disp: 90 tablet, Rfl: 3 .  levothyroxine  (SYNTHROID, LEVOTHROID) 175 MCG tablet, Take 1 tablet (175 mcg total) by mouth daily. Alternate days with 150, Disp: 30 tablet, Rfl: 6 .  lisinopril (ZESTRIL) 10 MG tablet, TAKE 1 TABLET BY MOUTH EVERY DAY, Disp: 90 tablet, Rfl: 0 .  omeprazole (PRILOSEC) 40 MG capsule, TAKE 1 CAPSULE BY MOUTH TWICE A DAY FOR ACID REFLUX, Disp: 180 capsule, Rfl: 3 .  QUEtiapine (SEROQUEL) 50 MG tablet, Take 1-2 tablets (50-100 mg total) by mouth at bedtime., Disp: 180 tablet, Rfl: 3 .  rizatriptan (MAXALT-MLT) 10 MG disintegrating tablet, TAKE 1 TABLET BY MOUTH AT ONSET OF HEADACHE MAY REPEAT ONCE IN 2 HOURS IF NEEDED, Disp: 12 tablet, Rfl: 0 .  Specialty Vitamins Products (MAGNESIUM, AMINO ACID CHELATE,) 133 MG tablet, Take 1 tablet by mouth 2 (two) times daily., Disp: , Rfl:  .  Tofacitinib Citrate 5 MG TABS, Take 1 tablet by mouth 2 (two) times daily. , Disp: , Rfl:  .  [START ON 08/13/2018] amphetamine-dextroamphetamine (ADDERALL XR) 25 MG 24 hr capsule, Take 1 capsule by mouth every morning., Disp: 30 capsule, Rfl: 0 .  senna (SENOKOT) 8.6 MG tablet, Take 1 tablet by mouth daily., Disp: , Rfl:  Medication Side Effects: none  Family Medical/ Social History: Changes? No  MENTAL HEALTH EXAM:  There were no vitals taken for this visit.There is no height or weight on file to calculate BMI.  General Appearance: Unable to assess  Eye Contact:  Unable to assess  Speech:  Clear and Coherent  Volume:  Normal  Mood:  Euthymic  Affect:  Unable to assess  Thought Process:  Goal Directed  Orientation:  Full (Time, Place, and Person)  Thought Content: Logical   Suicidal Thoughts:  No  Homicidal Thoughts:  No  Memory:  WNL  Judgement:  Good  Insight:  Good  Psychomotor Activity:  Unable to assess  Concentration:  Concentration: Fair and Attention Span: Fair  Recall:  Good  Fund of Knowledge: Good  Language: Good  Assets:  Desire for Improvement  ADL's:  Intact  Cognition: WNL  Prognosis:  Good     DIAGNOSES:    ICD-10-CM   1. Bipolar I disorder ([place])  F31.9   2. Migraine without status migrainosus, not intractable, unspecified migraine type  G43.909 amitriptyline (ELAVIL) 50 MG tablet  3. Attention deficit hyperactivity disorder (ADHD), combined type  F90.2   4. Insomnia, unspecified type  G47.00   5. Fear of flying  F40.243     Receiving Psychotherapy: No    RECOMMENDATIONS:  Increase Adderall XR to 25 mg p.o. every morning.  PDMP shows that she got her last Adderall XR 20 mg prescription filled yesterday.  Therefore she will not be able to fill this prescription until mid August.  She understands and accepts.  At around 3 weeks after starting this dose, she will give our office a call and leave a message to let me know how this dose is working.  If it is working well then I will send in 3 new prescriptions.  If not will increase to 30 mg and reassess  in a couple of weeks after that.  She verbalizes understanding. Continue amitriptyline 50 mg nightly. Continue Xanax 1 mg as needed flying. Continue Abilify 15 mg daily. Continue Seroquel 50 mg, 1-2 nightly for sleep. Return in 6 months.  Melony Overlyeresa Lamaria Hildebrandt, PA-C   This record has been created using [employer] software.  Chart creation errors have been sought, but may not always have been located and corrected. Such creation errors do not reflect on the standard of medical care.

## 2018-08-06 ENCOUNTER — Encounter: Payer: Self-pay | Admitting: Family Medicine

## 2018-08-06 NOTE — Progress Notes (Signed)
[place] at [facility] [address] [place], [place] [address] [phone]  Date:  08/08/2018   Name:  Grace Butler   DOB:  August 05, 1970   MRN:  [id number]  PCP:  Grace Mclean, MD    Chief Complaint: No chief complaint on file.   History of Present Illness:  Grace Butler is a 48 y.o. very pleasant female patient who presents with the following:  Virtual visit today  Pt location is home, provider is at her job Pt ID confirmed with 2 factors, she gives consent for virtual visit today  Pt with history of mood disorder, RA, hypothyroidism, IBS-constipation predominant Her mental health provider is Donnal Moat Rheumatologist is Dr Annabelle Harman  Today she notes that for the last couple of years she has often used stool softeners for occasional constipation She is drinking plenty of water, but this d did not seem to be enough Last week she had a sudden bowel movemment after eating-since then she has been having more loose stools than her normal constipation  She had diarrhea one day and thought she might even have food poisioning- no vomiting however  No blood in her stool  She has noted some mucus in her stool however  She is not aware of any family history of IBD She has lost about 6 lbs and is not trying to lose weight  She has noted off and on loose stools for about a week It appears yellow in color  No fever No recent abx  She might have up to about 5 stools per day  No med change No recent travel  No severe abd pain, she is able to eat, going to work and doing her normal activities   Lab Results  Component Value Date   TSH 0.28 (L) 09/08/2017   She is due for thyroid recheck Former smoker  Patient Active Problem List   Diagnosis Date Noted  . Attention deficit hyperactivity disorder (ADHD) 09/28/2017  . Insomnia, unspecified 09/28/2017  . Plantar fasciitis of left foot 01/17/2017  . Left knee pain  09/01/2016  . Obesity 01/14/2016  . Rheumatoid arthritis ([place]) 01/14/2016  . Hypothyroidism due to acquired atrophy of thyroid 01/14/2016  . Hormone replacement therapy 01/14/2016  . Osteopenia 09/15/2015  . Bipolar 1 disorder ([place]) 08/21/2015  . Gastroesophageal reflux disease without esophagitis 08/21/2015  . Migraine 08/21/2015    Past Medical History:  Diagnosis Date  . Depression   . Thyroid disease   . Ulcer     Past Surgical History:  Procedure Laterality Date  . ABDOMINAL HYSTERECTOMY    . CHOLECYSTECTOMY    . 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 32    Social History   Tobacco Use  . Smoking status: Former Smoker    Packs/day: 0.50    Years: 20.00    Pack years: 10.00    Quit date: 01/16/2014    Years since quitting: 4.5  . Smokeless tobacco: Never Used  Substance Use Topics  . Alcohol use: Not Currently  . Drug use: No    History reviewed. No pertinent family history.  Allergies  Allergen Reactions  . Coconut Oil Anaphylaxis  . Sulfa Antibiotics Hives  . Trazodone And Nefazodone     Vivid dreams    Medication list has been reviewed and updated.  Current Outpatient Medications on File Prior to Visit  Medication Sig Dispense Refill  . alendronate (FOSAMAX) 70 MG  tablet Take 70 mg by mouth.    . ALPRAZolam (XANAX) 1 MG tablet 1 po before flying prn 10 tablet 0  . amitriptyline (ELAVIL) 50 MG tablet Take 1 tablet (50 mg total) by mouth every evening. 90 tablet 1  . [START ON 08/13/2018] amphetamine-dextroamphetamine (ADDERALL XR) 25 MG 24 hr capsule Take 1 capsule by mouth every morning. 30 capsule 0  . ARIPiprazole (ABILIFY) 15 MG tablet Take 1 tablet (15 mg total) by mouth daily. 90 tablet 1  . cholecalciferol (VITAMIN D) 1000 UNITS tablet Take 1,000 Units by mouth daily.    . diclofenac (VOLTAREN) 25 MG EC tablet Take 25 mg by mouth.    . levothyroxine (SYNTHROID, LEVOTHROID) 150 MCG tablet Take 1 tablet (150 mcg total) by mouth daily. Alternate days with 175  mcg 90 tablet 3  . levothyroxine (SYNTHROID, LEVOTHROID) 175 MCG tablet Take 1 tablet (175 mcg total) by mouth daily. Alternate days with 150 30 tablet 6  . lisinopril (ZESTRIL) 10 MG tablet TAKE 1 TABLET BY MOUTH EVERY DAY 90 tablet 0  . omeprazole (PRILOSEC) 40 MG capsule TAKE 1 CAPSULE BY MOUTH TWICE A DAY FOR ACID REFLUX 180 capsule 3  . QUEtiapine (SEROQUEL) 50 MG tablet Take 1-2 tablets (50-100 mg total) by mouth at bedtime. 180 tablet 3  . rizatriptan (MAXALT-MLT) 10 MG disintegrating tablet TAKE 1 TABLET BY MOUTH AT ONSET OF HEADACHE MAY REPEAT ONCE IN 2 HOURS IF NEEDED 12 tablet 0  . senna (SENOKOT) 8.6 MG tablet Take 1 tablet by mouth daily.    Marland Kitchen Specialty Vitamins Products (MAGNESIUM, AMINO ACID CHELATE,) 133 MG tablet Take 1 tablet by mouth 2 (two) times daily.    . Tofacitinib Citrate 5 MG TABS Take 1 tablet by mouth 2 (two) times daily.      No current facility-administered medications on file prior to visit.     Review of Systems:  As per HPI- otherwise negative. No fever Physical Examination: There were no vitals filed for this visit. There were no vitals filed for this visit. There is no height or weight on file to calculate BMI. Ideal Body Weight:    Pt observed over video today  She looks well - no cough, wheezing or distress   Assessment and Plan:   ICD-10-CM   1. Hypothyroidism due to acquired atrophy of thyroid  E03.4 TSH  2. Mucus in stool  R19.5 Ambulatory referral to Gastroenterology  3. Diarrhea, unspecified type  R19.7 Ambulatory referral to Gastroenterology  4. Constipation, unspecified constipation type  K59.00 Ambulatory referral to Gastroenterology   Virtual visit today for diarrhea and mucus in stool. [name] has felt like she had irritable bowel symptoms for years, but her tendency was usually constipation.  She is a bit worried about this change to diarrhea.  I advised her that she can try over-the-counter Imodium if she likes, but she is afraid this  may cause constipation.  As she has noticed a stool habit change and some mucus in her stool I will refer to GI for consultation  She will let me know if any worsening or change in the meantime Order TSH, she will have this done at her convenience  Follow-up: No follow-ups on file.  No orders of the defined types were placed in this encounter.  Orders Placed This Encounter  Procedures  . TSH  . Ambulatory referral to Gastroenterology    @SIGN @    Signed , MD

## 2018-08-08 ENCOUNTER — Encounter: Payer: Self-pay | Admitting: Family Medicine

## 2018-08-08 ENCOUNTER — Ambulatory Visit (INDEPENDENT_AMBULATORY_CARE_PROVIDER_SITE_OTHER): Payer: BC Managed Care – PPO | Admitting: Family Medicine

## 2018-08-08 ENCOUNTER — Other Ambulatory Visit: Payer: Self-pay

## 2018-08-08 DIAGNOSIS — R197 Diarrhea, unspecified: Secondary | ICD-10-CM

## 2018-08-08 DIAGNOSIS — E034 Atrophy of thyroid (acquired): Secondary | ICD-10-CM | POA: Diagnosis not present

## 2018-08-08 DIAGNOSIS — R195 Other fecal abnormalities: Secondary | ICD-10-CM | POA: Diagnosis not present

## 2018-08-08 DIAGNOSIS — K5900 Constipation, unspecified: Secondary | ICD-10-CM

## 2018-08-15 ENCOUNTER — Telehealth: Payer: Self-pay

## 2018-08-15 NOTE — Telephone Encounter (Signed)
Left voicemail for patient to call back. 

## 2018-08-17 ENCOUNTER — Encounter: Payer: Self-pay | Admitting: Gastroenterology

## 2018-08-17 ENCOUNTER — Ambulatory Visit: Payer: BC Managed Care – PPO | Admitting: Gastroenterology

## 2018-08-17 ENCOUNTER — Other Ambulatory Visit: Payer: Self-pay

## 2018-08-17 VITALS — BP 124/76 | HR 74 | Temp 97.7°F | Ht 69.0 in | Wt 308.0 lb

## 2018-08-17 DIAGNOSIS — R194 Change in bowel habit: Secondary | ICD-10-CM

## 2018-08-17 DIAGNOSIS — R197 Diarrhea, unspecified: Secondary | ICD-10-CM

## 2018-08-17 MED ORDER — DICYCLOMINE HCL 10 MG PO CAPS: 10.0000 mg | ORAL_CAPSULE | Freq: Every day | ORAL | 3 refills | Status: DC

## 2018-08-17 NOTE — Telephone Encounter (Signed)
Covid-19 screening questions   Do you now or have you had a fever in the last 14 days? NO   Do you have any respiratory symptoms of shortness of breath or cough now or in the last 14 days? NO  Do you have any family members or close contacts with diagnosed or suspected Covid-19 in the past 14 days? NO  Have you been tested for Covid-19 and found to be positive? NO        

## 2018-08-17 NOTE — Progress Notes (Signed)
Chief Complaint: Diarrhea  Referring Provider:  Darreld Mclean, MD      ASSESSMENT AND PLAN;   #1.  Change in bowel habits in form of increasing diarrhea.  Previous H/O IBS with predom constipation.  #2.  Comorbid conditions include GERD, RA, anxiety/depression, obesity, hypothyroidism (thyroid medications being adjusted), postcholecystectomy.  Plan: - Stool studies for GI Pathogen (includes C. Diff), WBCs, culture,O&P, fecal elastase. - Check CBC with diff, CMP, TSH, CRP and sed rate. - Proceed with colonoscopy with mirlax. Discussed risks & benefits. (Risks including rare perforation req laparotomy, bleeding after biopsies/polypectomy req blood transfusion, rare chance of missing neoplasms, risks of anesthesia/sedation). Benefits outweigh the risks. Patient agrees to proceed. All the questions were answered.  - Stop mag supplements. - Bentyl 10mg  po bid 1/2 hr before lunce and bed time - If still with problems and the above WU is negative, proceed with CT scan abdo/pelvis with p.o. and IV contrast.      HPI:    Grace Butler is a 48 y.o. female  H/O constipation in past, Was on stool softners for over 8 to 10 years  Over 3 weeks ago, started having diarrhea with loose bowel movements at frequency of 5-7/day, with mucus, x 3 weeks.  Had one episode of rectal bleeding which was bright red in color.  Mixed with the stool.  Has been having nocturnal symptoms as well.  Nobody in the family with diarrhea.  No exposure to anyone with gastroenteritis.  No exposure to covid-19.  Advised colonoscopy to rule out Crohn's disease since she does have history of rheumatoid arthritis.  Patient is currently on Xeljanz and is compliant with medications.  Has been taking magnesium supplements but has not changed the brand.  Gained 15lb over 3 months  Off and on steroids for RA (Dx 7 yrs ago)  No fever or chills.  No family history of Crohn's disease.  Thyroid medications are  still being adjusted.   Past Medical History:  Diagnosis Date  . Depression   . Thyroid disease   . Ulcer     Past Surgical History:  Procedure Laterality Date  . ABDOMINAL HYSTERECTOMY    . CHOLECYSTECTOMY    . ESOPHAGOGASTRODUODENOSCOPY     about 20 years ago to check for ulcers  . 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 78    Family History  Problem Relation Age of Onset  . Colon cancer Neg Hx   . Esophageal cancer Neg Hx     Social History   Tobacco Use  . Smoking status: Former Smoker    Packs/day: 0.50    Years: 20.00    Pack years: 10.00    Quit date: 01/16/2014    Years since quitting: 4.5  . Smokeless tobacco: Never Used  Substance Use Topics  . Alcohol use: Not Currently  . Drug use: No    Current Outpatient Medications  Medication Sig Dispense Refill  . alendronate (FOSAMAX) 70 MG tablet Take 70 mg by mouth.    Marland Kitchen amitriptyline (ELAVIL) 50 MG tablet Take 1 tablet (50 mg total) by mouth every evening. 90 tablet 1  . amphetamine-dextroamphetamine (ADDERALL XR) 25 MG 24 hr capsule Take 1 capsule by mouth every morning. 30 capsule 0  . ARIPiprazole (ABILIFY) 15 MG tablet Take 1 tablet (15 mg total) by mouth daily. 90 tablet 1  . cholecalciferol (VITAMIN D) 1000 UNITS tablet Take 1,000 Units by mouth daily.    . diclofenac (VOLTAREN) 25  MG EC tablet Take 25 mg by mouth.    . levothyroxine (SYNTHROID, LEVOTHROID) 150 MCG tablet Take 1 tablet (150 mcg total) by mouth daily. Alternate days with 175 mcg 90 tablet 3  . levothyroxine (SYNTHROID, LEVOTHROID) 175 MCG tablet Take 1 tablet (175 mcg total) by mouth daily. Alternate days with 150 30 tablet 6  . lisinopril (ZESTRIL) 10 MG tablet TAKE 1 TABLET BY MOUTH EVERY DAY 90 tablet 0  . omeprazole (PRILOSEC) 40 MG capsule TAKE 1 CAPSULE BY MOUTH TWICE A DAY FOR ACID REFLUX 180 capsule 3  . QUEtiapine (SEROQUEL) 50 MG tablet Take 1-2 tablets (50-100 mg total) by mouth at bedtime. 180 tablet 3  . rizatriptan (MAXALT-MLT) 10 MG  disintegrating tablet TAKE 1 TABLET BY MOUTH AT ONSET OF HEADACHE MAY REPEAT ONCE IN 2 HOURS IF NEEDED 12 tablet 0  . Specialty Vitamins Products (MAGNESIUM, AMINO ACID CHELATE,) 133 MG tablet Take 1 tablet by mouth 2 (two) times daily.    . Tofacitinib Citrate 5 MG TABS Take 1 tablet by mouth 2 (two) times daily.     Marland Kitchen. ALPRAZolam (XANAX) 1 MG tablet 1 po before flying prn (Patient not taking: Reported on 08/17/2018) 10 tablet 0   No current facility-administered medications for this visit.     Allergies  Allergen Reactions  . Coconut Oil Anaphylaxis  . Sulfa Antibiotics Hives  . Trazodone And Nefazodone     Vivid dreams    Review of Systems:  Constitutional: Denies fever, chills, diaphoresis, appetite change and fatigue.  HEENT: Denies photophobia, eye pain, redness, hearing loss, ear pain, congestion, sore throat, rhinorrhea, sneezing, mouth sores, neck pain, neck stiffness and tinnitus.   Respiratory: Denies SOB, DOE, cough, chest tightness,  and wheezing.   Cardiovascular: Denies chest pain, palpitations and leg swelling.  Genitourinary: Denies dysuria, urgency, frequency, hematuria, flank pain and difficulty urinating.  Musculoskeletal: Has myalgias, back pain, joint swelling, arthralgias and no gait problem.  Skin: No rash.  Neurological: Denies dizziness, seizures, syncope, weakness, light-headedness, numbness and headaches.  Hematological: Denies adenopathy. Easy bruising, personal or family bleeding history  Psychiatric/Behavioral: has anxiety or depression     Physical Exam:    BP 124/76   Pulse 74   Temp 97.7 F (36.5 C)   Ht 5\' 9"  (1.753 m)   Wt (!) 308 lb (139.7 kg)   BMI 45.48 kg/m  Filed Weights   08/17/18 1005  Weight: (!) 308 lb (139.7 kg)   Constitutional:  Well-developed, in no acute distress. Psychiatric: Normal mood and affect. Behavior is normal. HEENT: Pupils normal.  Conjunctivae are normal. No scleral icterus. Neck supple.  Cardiovascular:  Normal rate, regular rhythm. No edema Pulmonary/chest: Effort normal and breath sounds normal. No wheezing, rales or rhonchi. Abdominal: Soft, nondistended. Nontender. Bowel sounds active throughout. There are no masses palpable. No hepatomegaly. Rectal:  defered Neurological: Alert and oriented to person place and time. Skin: Skin is warm and dry. No rashes noted.  Data Reviewed: I have personally reviewed following labs and imaging studies  CBC: CBC Latest Ref Rng & Units 09/08/2017 01/06/2017 07/08/2016  WBC 3.8 - 10.8 Thousand/uL 6.5 8.4 6.2  Hemoglobin 11.7 - 15.5 g/dL 04.512.7 40.913.6 81.113.7  Hematocrit 35.0 - 45.0 % 38.6 41.6 40.6  Platelets 140 - 400 Thousand/uL 304 331.0 285.0    CMP: CMP Latest Ref Rng & Units 09/08/2017 01/06/2017 01/14/2016  Glucose 65 - 99 mg/dL 80 82 83  BUN 7 - 25 mg/dL 14 14 10   Creatinine  0.50 - 1.10 mg/dL 0.10 2.72 5.36  Sodium 135 - 146 mmol/L 141 139 141  Potassium 3.5 - 5.3 mmol/L 4.3 4.2 4.2  Chloride 98 - 110 mmol/L 104 102 105  CO2 20 - 32 mmol/L 26 28 27   Calcium 8.6 - 10.2 mg/dL 9.8 9.5 9.7  Total Protein 6.1 - 8.1 g/dL 7.0 7.5 7.3  Total Bilirubin 0.2 - 1.2 mg/dL 0.3 0.5 0.5  Alkaline Phos 39 - 117 U/L - 99 81  AST 10 - 35 U/L 20 29 23   ALT 6 - 29 U/L 25 36(H) 26      , MD 08/17/2018, 10:25 AM  Cc: Edman Circle, MD

## 2018-08-17 NOTE — Patient Instructions (Signed)
Your provider has requested that you go to [facility] in [place], the address is [address], [place], [place] [address] for lab work. Press "B" on the elevator. The lab is located at the first door on the left as you exit the elevator.  You have been scheduled for a colonoscopy. Please follow written instructions given to you at your visit today.  Please pick up your prep supplies at the pharmacy within the next 1-3 days. If you use inhalers (even only as needed), please bring them with you on the day of your procedure.  Stop magnesium supplements.   We have sent the following medications to your pharmacy for you to pick up at your convenience: Bentyl 10 mg twice a day 1/2 hour before lunch and at bedtime.

## 2018-08-28 DIAGNOSIS — Z1231 Encounter for screening mammogram for malignant neoplasm of breast: Secondary | ICD-10-CM | POA: Diagnosis not present

## 2018-08-28 LAB — HM MAMMOGRAPHY

## 2018-08-30 ENCOUNTER — Encounter: Payer: Self-pay | Admitting: Family Medicine

## 2018-08-31 ENCOUNTER — Encounter: Payer: Self-pay | Admitting: Family Medicine

## 2018-09-03 ENCOUNTER — Encounter: Payer: Self-pay | Admitting: Gastroenterology

## 2018-09-06 ENCOUNTER — Telehealth: Payer: Self-pay

## 2018-09-06 NOTE — Telephone Encounter (Signed)
Pt responded "no" to all screening questions °

## 2018-09-06 NOTE — Telephone Encounter (Signed)
Covid-19 screening questions   Do you now or have you had a fever in the last 14 days?  Do you have any respiratory symptoms of shortness of breath or cough now or in the last 14 days?  Do you have any family members or close contacts with diagnosed or suspected Covid-19 in the past 14 days?  Have you been tested for Covid-19 and found to be positive?       

## 2018-09-07 ENCOUNTER — Other Ambulatory Visit: Payer: Self-pay

## 2018-09-07 ENCOUNTER — Encounter: Payer: Self-pay | Admitting: Gastroenterology

## 2018-09-07 ENCOUNTER — Ambulatory Visit (AMBULATORY_SURGERY_CENTER): Payer: BC Managed Care – PPO | Admitting: Gastroenterology

## 2018-09-07 VITALS — BP 108/61 | HR 68 | Temp 97.4°F | Resp 14 | Ht 69.0 in | Wt 301.0 lb

## 2018-09-07 DIAGNOSIS — K648 Other hemorrhoids: Secondary | ICD-10-CM

## 2018-09-07 DIAGNOSIS — K6389 Other specified diseases of intestine: Secondary | ICD-10-CM | POA: Diagnosis not present

## 2018-09-07 DIAGNOSIS — K599 Functional intestinal disorder, unspecified: Secondary | ICD-10-CM | POA: Diagnosis not present

## 2018-09-07 DIAGNOSIS — R197 Diarrhea, unspecified: Secondary | ICD-10-CM

## 2018-09-07 DIAGNOSIS — R194 Change in bowel habit: Secondary | ICD-10-CM

## 2018-09-07 DIAGNOSIS — K319 Disease of stomach and duodenum, unspecified: Secondary | ICD-10-CM | POA: Diagnosis not present

## 2018-09-07 HISTORY — PX: COLONOSCOPY: SHX174

## 2018-09-07 MED ORDER — SODIUM CHLORIDE 0.9 % IV SOLN: 500.0000 mL | Freq: Once | INTRAVENOUS | Status: DC

## 2018-09-07 NOTE — Progress Notes (Signed)
PT taken to PACU. Monitors in place. VSS. Report given to RN. 

## 2018-09-07 NOTE — Patient Instructions (Signed)
Await pathology results.  Stop all herbal/other supplements.  Bring all supplements to next GI visit in 8 weeks.  If diarrhea continues, increase Bentyl to 3 times a day.  YOU HAD AN ENDOSCOPIC PROCEDURE TODAY AT [facility]:   Refer to the procedure report that was given to you for any specific questions about what was found during the examination.  If the procedure report does not answer your questions, please call your gastroenterologist to clarify.  If you requested that your care partner not be given the details of your procedure findings, then the procedure report has been included in a sealed envelope for you to review at your convenience later.  YOU SHOULD EXPECT: Some feelings of bloating in the abdomen. Passage of more gas than usual.  Walking can help get rid of the air that was put into your GI tract during the procedure and reduce the bloating. If you had a lower endoscopy (such as a colonoscopy or flexible sigmoidoscopy) you may notice spotting of blood in your stool or on the toilet paper. If you underwent a bowel prep for your procedure, you may not have a normal bowel movement for a few days.  Please Note:  You might notice some irritation and congestion in your nose or some drainage.  This is from the oxygen used during your procedure.  There is no need for concern and it should clear up in a day or so.  SYMPTOMS TO REPORT IMMEDIATELY:   Following lower endoscopy (colonoscopy or flexible sigmoidoscopy):  Excessive amounts of blood in the stool  Significant tenderness or worsening of abdominal pains  Swelling of the abdomen that is new, acute  Fever of 100F or higher   For urgent or emergent issues, a gastroenterologist can be reached at any hour by calling [phone].   DIET:  We do recommend a small meal at first, but then you may proceed to your regular diet.  Drink plenty of fluids but you should avoid alcoholic beverages for 24 hours.  ACTIVITY:   You should plan to take it easy for the rest of today and you should NOT DRIVE or use heavy machinery until tomorrow (because of the sedation medicines used during the test).    FOLLOW UP: Our staff will call the number listed on your records 48-72 hours following your procedure to check on you and address any questions or concerns that you may have regarding the information given to you following your procedure. If we do not reach you, we will leave a message.  We will attempt to reach you two times.  During this call, we will ask if you have developed any symptoms of COVID 19. If you develop any symptoms (ie: fever, flu-like symptoms, shortness of breath, cough etc.) before then, please call [phone].  If you test positive for Covid 19 in the 2 weeks post procedure, please call and report this information to [place].    If any biopsies were taken you will be contacted by phone or by letter within the next 1-3 weeks.  Please call us at [phone] if you have not heard about the biopsies in 3 weeks.    SIGNATURES/CONFIDENTIALITY: You and/or your care partner have signed paperwork which will be entered into your electronic medical record.  These signatures attest to the fact that that the information above on your After Visit Summary has been reviewed and is understood.  Full responsibility of the confidentiality of this discharge information lies with  you and/or your care-partner.

## 2018-09-07 NOTE — Op Note (Signed)
[facility] Patient Name: Grace Butler Procedure Date: 09/07/2018 10:13 AM MRN: [id number] Endoscopist: Lynann Bologna , MD Age: 48 Referring MD:  Date of Birth: 26-Sep-1970 Gender: Female Account #: [id number] Procedure:                Colonoscopy Indications:              Clinically significant diarrhea of unexplained                            origin Medicines:                Monitored Anesthesia Care Procedure:                Pre-Anesthesia Assessment:                           - Prior to the procedure, a History and Physical                            was performed, and patient medications and                            allergies were reviewed. The patient's tolerance of                            previous anesthesia was also reviewed. The risks                            and benefits of the procedure and the sedation                            options and risks were discussed with the patient.                            All questions were answered, and informed consent                            was obtained. Prior Anticoagulants: The patient has                            taken no previous anticoagulant or antiplatelet                            agents. ASA Grade Assessment: II - A patient with                            mild systemic disease. After reviewing the risks                            and benefits, the patient was deemed in                            satisfactory condition to undergo the procedure.  After obtaining informed consent, the colonoscope                            was passed under direct vision. Throughout the                            procedure, the patient's blood pressure, pulse, and                            oxygen saturations were monitored continuously. The                            Colonoscope was introduced through the anus and                            advanced to the 2 cm into the ileum. The          colonoscopy was performed without difficulty. The                            patient tolerated the procedure well. The quality                            of the bowel preparation was good except in some                            areas of the right colon with there was adherent                            stool which could not be fully washed despite                            aggressive suctioning and aspiration. Overall over                            90 to 95% with chronic mucus was visualized                            satisfactorily. Photodocumentation was obtained.                            The terminal ileum, ileocecal valve, appendiceal                            orifice, and rectum were photographed. Scope In: 10:18:25 AM Scope Out: 10:37:02 AM Scope Withdrawal Time: 0 hours 13 minutes 11 seconds  Total Procedure Duration: 0 hours 18 minutes 37 seconds  Findings:                 The colon (entire examined portion) appeared normal                            except for subtle mild melanosis coli. Biopsies for  histology were taken with a cold forceps for                            evaluation of microscopic colitis.                           Non-bleeding internal hemorrhoids were found during                            retroflexion and during perianal exam. The                            hemorrhoids were small.                           The terminal ileum appeared normal. Biopsies were                            taken with a cold forceps for histology.                           The exam was otherwise without abnormality. Complications:            No immediate complications. Estimated Blood Loss:     Estimated blood loss: none. Impression:               - Mild melanosis coli.                           - Non-bleeding internal hemorrhoids.                           - Otherwise normal colonoscopy to TI. Recommendation:           - Patient has a contact  number available for                            emergencies. The signs and symptoms of potential                            delayed complications were discussed with the                            patient. Return to normal activities tomorrow.                            Written discharge instructions were provided to the                            patient.                           - Resume previous diet.                           - Recommend stopping all herbal/other supplements.  I do believe that some of the supplements may have                            ingredients causing diarrhea. Pl bring all the                            supplements the next visit.                           - Continue present medications.                           - If still with diarrhea, increase Bentyl 10 mg                            p.o. 3 times daily.                           - Await pathology results.                           - Return to GI clinic in 8 weeks. Jackquline Denmark, MD 09/07/2018 10:45:55 AM This report has been signed electronically.

## 2018-09-07 NOTE — Progress Notes (Signed)
June temp Courtney vitals 

## 2018-09-12 ENCOUNTER — Telehealth: Payer: Self-pay | Admitting: *Deleted

## 2018-09-12 ENCOUNTER — Telehealth: Payer: Self-pay

## 2018-09-12 NOTE — Telephone Encounter (Signed)
  Follow up Call-  Call back number 09/07/2018  Post procedure Call Back phone  # [phone]  Permission to leave phone message Yes  Some recent data might be hidden   LMOM to call back with any questions or concerns or if she has developed any symptoms of the covid 19

## 2018-09-12 NOTE — Telephone Encounter (Signed)
Left message on follow up call. 

## 2018-09-13 ENCOUNTER — Other Ambulatory Visit: Payer: Self-pay | Admitting: Physician Assistant

## 2018-09-14 DIAGNOSIS — M0600 Rheumatoid arthritis without rheumatoid factor, unspecified site: Secondary | ICD-10-CM | POA: Diagnosis not present

## 2018-09-14 DIAGNOSIS — M810 Age-related osteoporosis without current pathological fracture: Secondary | ICD-10-CM | POA: Diagnosis not present

## 2018-09-14 NOTE — Telephone Encounter (Signed)
Last seen in July with dose increase, last fill 08/10

## 2018-09-17 ENCOUNTER — Encounter: Payer: Self-pay | Admitting: Family Medicine

## 2018-09-23 ENCOUNTER — Encounter: Payer: Self-pay | Admitting: Gastroenterology

## 2018-09-24 ENCOUNTER — Other Ambulatory Visit: Payer: Self-pay

## 2018-09-24 ENCOUNTER — Encounter: Payer: Self-pay | Admitting: Family Medicine

## 2018-09-24 ENCOUNTER — Ambulatory Visit (INDEPENDENT_AMBULATORY_CARE_PROVIDER_SITE_OTHER): Payer: BC Managed Care – PPO | Admitting: Family Medicine

## 2018-09-24 DIAGNOSIS — J0110 Acute frontal sinusitis, unspecified: Secondary | ICD-10-CM

## 2018-09-24 MED ORDER — AMOXICILLIN-POT CLAVULANATE 875-125 MG PO TABS: 1.0000 | ORAL_TABLET | Freq: Two times a day (BID) | ORAL | 0 refills | Status: DC

## 2018-09-24 NOTE — Progress Notes (Signed)
[facility] [address] [place], [place] [address] [phone]  Date:  09/24/2018   Name:  Grace Butler   DOB:  [birth year]   MRN:  [id number]  PCP:  Pearline Cables, MD    Chief Complaint: No chief complaint on file.   History of Present Illness:  Grace Butler is a 48 y.o. very pleasant female patient who presents with the following:  Pt with history of RA, hypothyroidism, ADD, bipolar disorder Virtual visit today to discuss possible sinus infection Pt location is home, provider is at office Patient identity confirmed with 2 factors, she gives consent for virtual visit today  She has noted sx of illness for the last 3 days-think this is a sinus infection like she has had in the past She notes sinus congestion and pressure She is getting green mucus from her chest and sinuses Her chest is congested, it burns when she coughs No sick contacts known  No chest pain or shortness of breath  She is coughing up drainage from her chest but does not have a cough per se No fever noted  Her ears are itchy but not painful Some ST she thinks from drainage No white exudate on her throat  No vomiting or diarrhea noted    She notes that she will tend to get a sinus infection around this time of year, when the weather changes She does not wish to be tested for covid    Patient Active Problem List   Diagnosis Date Noted  . Attention deficit hyperactivity disorder (ADHD) 09/28/2017  . Insomnia, unspecified 09/28/2017  . Plantar fasciitis of left foot 01/17/2017  . Left knee pain 09/01/2016  . Obesity 01/14/2016  . Rheumatoid arthritis (HCC) 01/14/2016  . Hypothyroidism due to acquired atrophy of thyroid 01/14/2016  . Hormone replacement therapy 01/14/2016  . Osteopenia 09/15/2015  . Bipolar 1 disorder (HCC) 08/21/2015  . Gastroesophageal reflux disease without esophagitis 08/21/2015  . Migraine 08/21/2015     Past Medical History:  Diagnosis Date  . Chronic headache   . Depression   . Gallstone   . Hypertension   . IBS (irritable bowel syndrome)   . Obesity   . Thyroid disease   . Ulcer     Past Surgical History:  Procedure Laterality Date  . ABDOMINAL HYSTERECTOMY    . APPENDECTOMY    . CHOLECYSTECTOMY    . COLONOSCOPY  09/07/2018  . CYSTECTOMY     dermoid  . ESOPHAGOGASTRODUODENOSCOPY     about 20 years ago to check for ulcers  . MYOMECTOMY    . 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 13  . UPPER GASTROINTESTINAL ENDOSCOPY      Social History   Tobacco Use  . Smoking status: Former Smoker    Packs/day: 0.50    Years: 20.00    Pack years: 10.00    Quit date: 01/16/2014    Years since quitting: 4.6  . Smokeless tobacco: Never Used  Substance Use Topics  . Alcohol use: Not Currently  . Drug use: No    Family History  Problem Relation Age of Onset  . Colon cancer Neg Hx   . Esophageal cancer Neg Hx   . Colon polyps Neg Hx   . Rectal cancer Neg Hx   . Stomach cancer Neg Hx     Allergies  Allergen Reactions  . Coconut Oil Anaphylaxis  . Sulfa Antibiotics Hives  .  Trazodone And Nefazodone     Vivid dreams    Medication list has been reviewed and updated.  Current Outpatient Medications on File Prior to Visit  Medication Sig Dispense Refill  . ADDERALL XR 25 MG 24 hr capsule TAKE 1 CAPSULE BY MOUTH EACH MORNING 30 capsule 0  . alendronate (FOSAMAX) 70 MG tablet Take 70 mg by mouth.    . ALPRAZolam (XANAX) 1 MG tablet 1 po before flying prn (Patient not taking: Reported on 08/17/2018) 10 tablet 0  . amitriptyline (ELAVIL) 50 MG tablet Take 1 tablet (50 mg total) by mouth every evening. 90 tablet 1  . ARIPiprazole (ABILIFY) 15 MG tablet Take 1 tablet (15 mg total) by mouth daily. 90 tablet 1  . cholecalciferol (VITAMIN D) 1000 UNITS tablet Take 1,000 Units by mouth daily.    Marland Kitchen dicyclomine (BENTYL) 10 MG capsule Take 1 capsule (10 mg total) by mouth daily. 30 capsule 3   . levothyroxine (SYNTHROID, LEVOTHROID) 150 MCG tablet Take 1 tablet (150 mcg total) by mouth daily. Alternate days with 175 mcg 90 tablet 3  . levothyroxine (SYNTHROID, LEVOTHROID) 175 MCG tablet Take 1 tablet (175 mcg total) by mouth daily. Alternate days with 150 30 tablet 6  . lisinopril (ZESTRIL) 10 MG tablet TAKE 1 TABLET BY MOUTH EVERY DAY 90 tablet 0  . omeprazole (PRILOSEC) 40 MG capsule TAKE 1 CAPSULE BY MOUTH TWICE A DAY FOR ACID REFLUX 180 capsule 3  . QUEtiapine (SEROQUEL) 50 MG tablet Take 1-2 tablets (50-100 mg total) by mouth at bedtime. 180 tablet 3  . rizatriptan (MAXALT-MLT) 10 MG disintegrating tablet TAKE 1 TABLET BY MOUTH AT ONSET OF HEADACHE MAY REPEAT ONCE IN 2 HOURS IF NEEDED 12 tablet 0  . Specialty Vitamins Products (MAGNESIUM, AMINO ACID CHELATE,) 133 MG tablet Take 1 tablet by mouth 2 (two) times daily.    . Tofacitinib Citrate 5 MG TABS Take 1 tablet by mouth 2 (two) times daily.      No current facility-administered medications on file prior to visit.     Review of Systems:  As per HPI- otherwise negative. No rash No chest pain No fever  Physical Examination: There were no vitals filed for this visit. There were no vitals filed for this visit. There is no height or weight on file to calculate BMI. Ideal Body Weight:    Pt has checked her temp only -not checking other vitals Patient surgery video.  She looks well, no cough, shortness of breath, distress is noted.  Appears her normal self  Assessment and Plan: Acute frontal sinusitis, recurrence not specified - Plan: amoxicillin-clavulanate (AUGMENTIN) 875-125 MG tablet  Virtual visit today for illness.  Patient declines COVID testing.  Her symptoms do not sound overly suggestive of COVID.  We will treat for a sinus infection with Augmentin twice a day for 10 days.  She will let me know if not feeling better in the next 2 or 3 days, sooner if worse.  Suggested some over-the-counter medications may be  helpful for her.  She does not need a work note  Signed Lamar Blinks, MD

## 2018-09-26 ENCOUNTER — Other Ambulatory Visit: Payer: Self-pay | Admitting: Family Medicine

## 2018-09-26 DIAGNOSIS — I10 Essential (primary) hypertension: Secondary | ICD-10-CM

## 2018-10-11 ENCOUNTER — Telehealth: Payer: Self-pay | Admitting: Physician Assistant

## 2018-10-11 ENCOUNTER — Other Ambulatory Visit: Payer: Self-pay

## 2018-10-11 DIAGNOSIS — M0600 Rheumatoid arthritis without rheumatoid factor, unspecified site: Secondary | ICD-10-CM | POA: Diagnosis not present

## 2018-10-11 NOTE — Telephone Encounter (Signed)
Refills pended for approval.

## 2018-10-11 NOTE — Telephone Encounter (Signed)
Grace Butler called to request refill of her Adderall and Abilify.  Next appt 01/16/19.  Send to [facility]

## 2018-10-12 MED ORDER — ADDERALL XR 25 MG PO CP24: 25.0000 mg | ORAL_CAPSULE | ORAL | 0 refills | Status: DC

## 2018-10-12 MED ORDER — ADDERALL XR 25 MG PO CP24: ORAL_CAPSULE | ORAL | 0 refills | Status: DC

## 2018-10-12 MED ORDER — ARIPIPRAZOLE 15 MG PO TABS: 15.0000 mg | ORAL_TABLET | Freq: Every day | ORAL | 1 refills | Status: DC

## 2018-10-27 ENCOUNTER — Other Ambulatory Visit: Payer: Self-pay | Admitting: Family Medicine

## 2018-10-27 DIAGNOSIS — E034 Atrophy of thyroid (acquired): Secondary | ICD-10-CM

## 2018-10-31 IMAGING — DX DG KNEE AP/LAT W/ SUNRISE*L*
3 series · 3 of 3 positions shown · non-contrast
Comparison: None.

CLINICAL DATA: Left knee pain for 6 months.

EXAM:
LEFT KNEE 3 VIEWS

[knee ap]
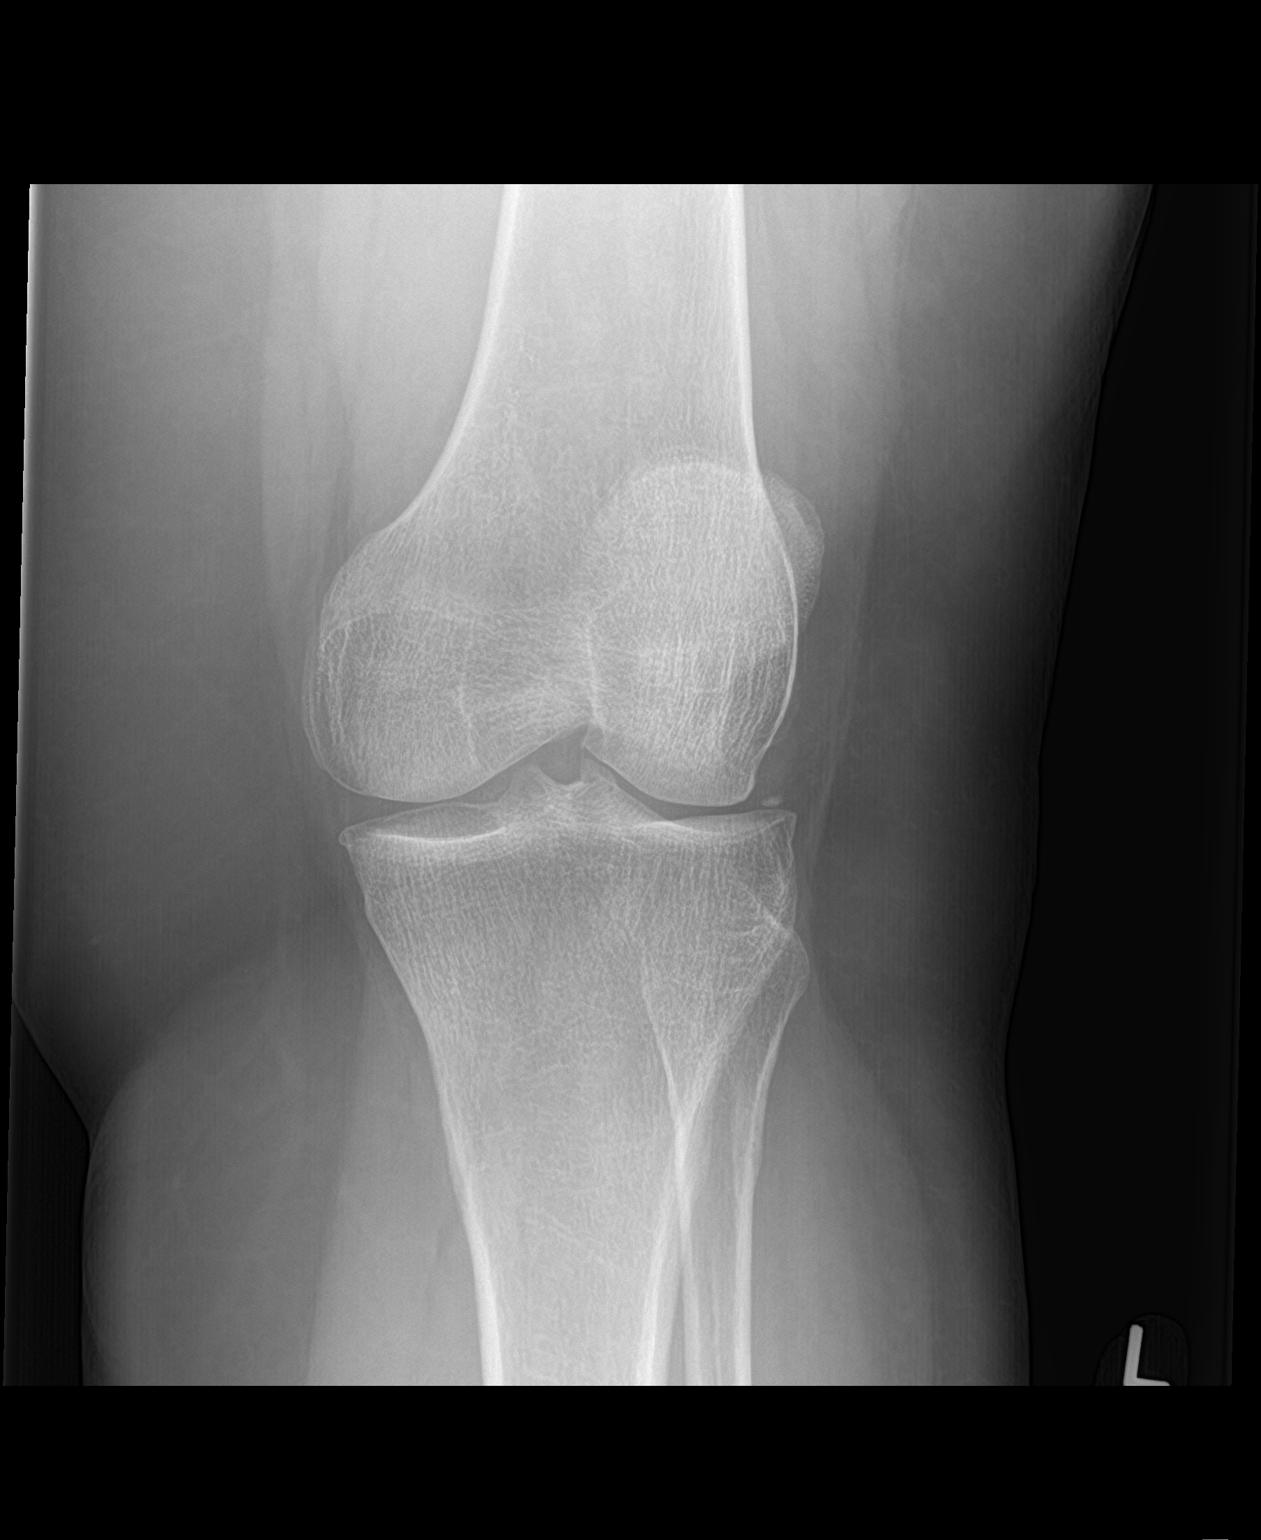

[knee lat]
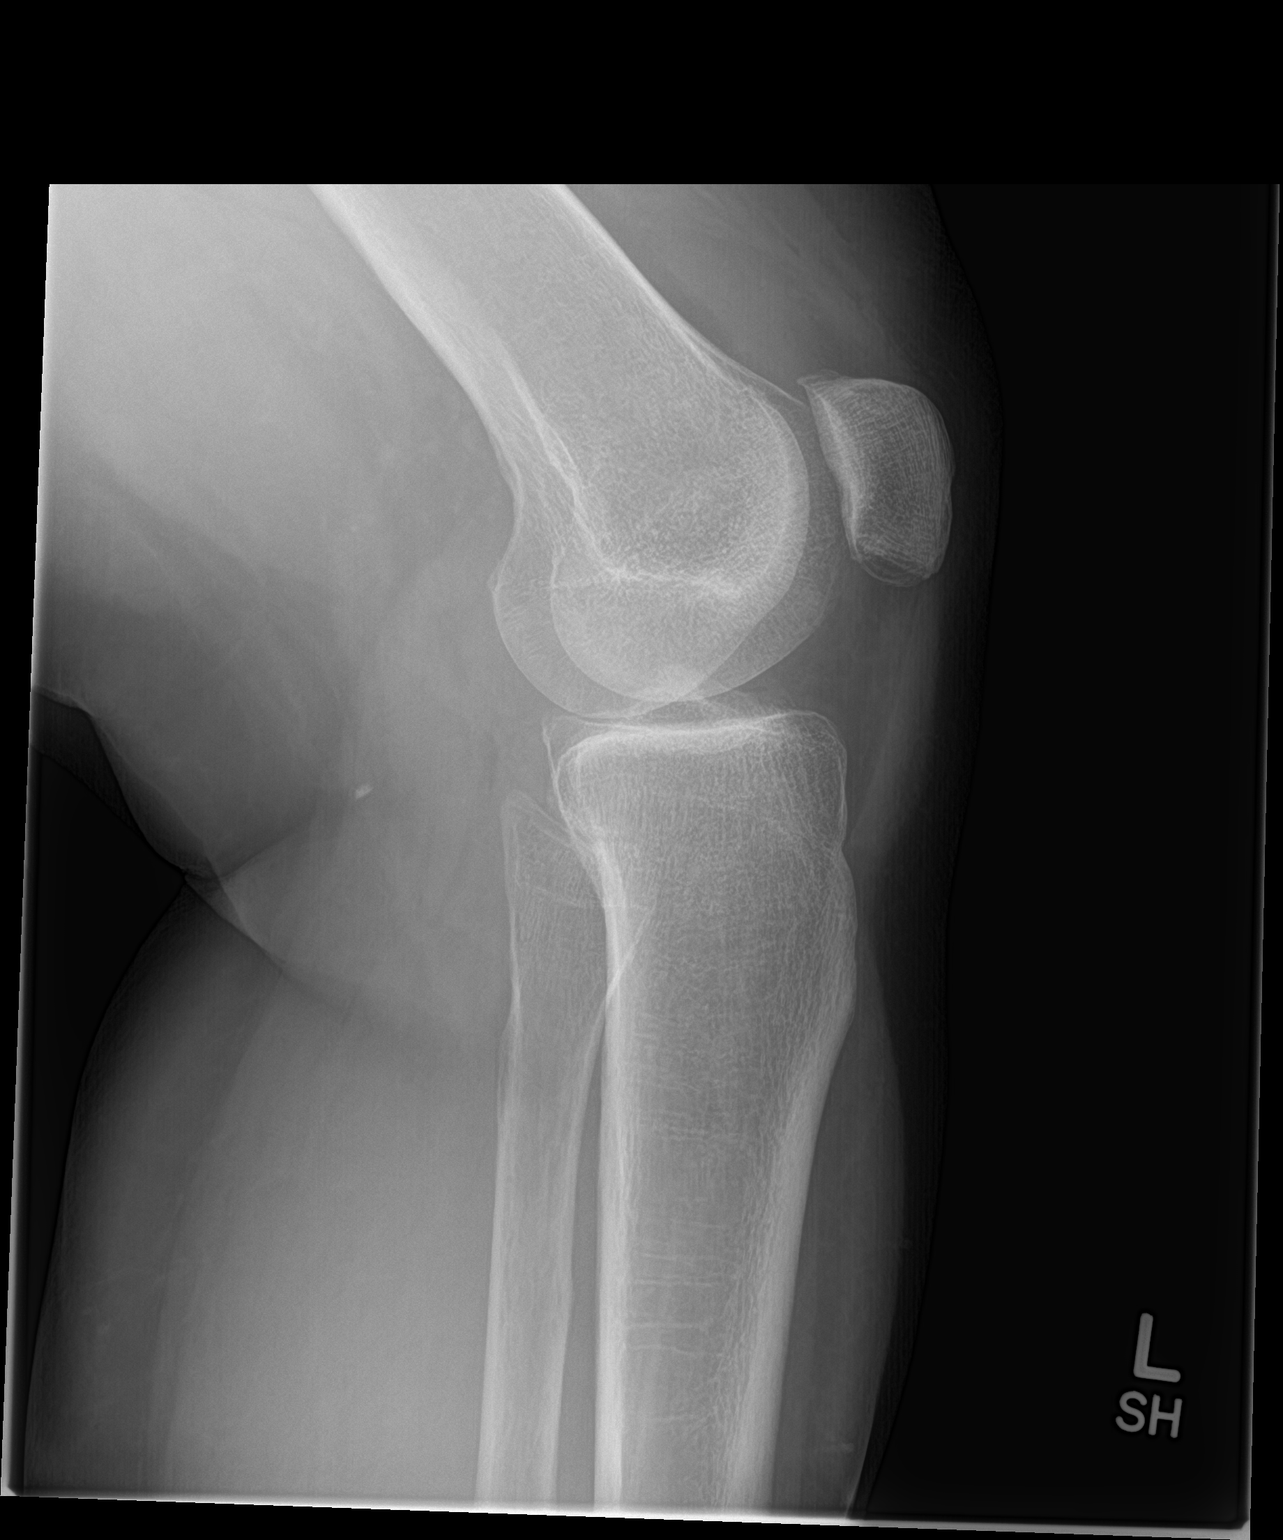

[knee sunrise]
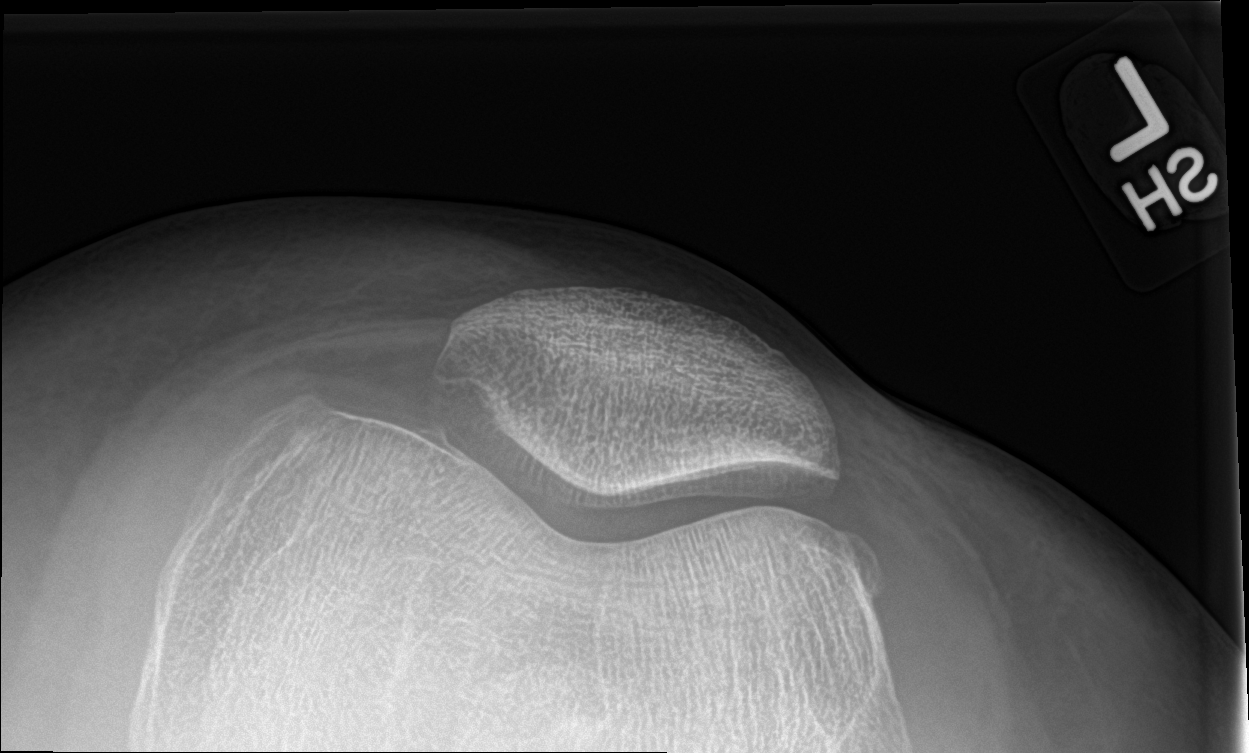

[3 of 3 positions shown; findings below may reference images not displayed]

FINDINGS: No acute bony abnormality. Specifically, no fracture, subluxation,
or dislocation. Joint spaces maintained. No joint effusion.
IMPRESSION: No acute bony abnormality.

## 2018-11-08 DIAGNOSIS — Z20828 Contact with and (suspected) exposure to other viral communicable diseases: Secondary | ICD-10-CM | POA: Diagnosis not present

## 2018-11-12 DIAGNOSIS — R519 Headache, unspecified: Secondary | ICD-10-CM | POA: Diagnosis not present

## 2018-11-12 DIAGNOSIS — R0981 Nasal congestion: Secondary | ICD-10-CM | POA: Diagnosis not present

## 2018-11-12 DIAGNOSIS — Z20828 Contact with and (suspected) exposure to other viral communicable diseases: Secondary | ICD-10-CM | POA: Diagnosis not present

## 2018-11-12 DIAGNOSIS — R6883 Chills (without fever): Secondary | ICD-10-CM | POA: Diagnosis not present

## 2018-11-26 ENCOUNTER — Encounter: Payer: Self-pay | Admitting: Family Medicine

## 2018-11-26 DIAGNOSIS — G43909 Migraine, unspecified, not intractable, without status migrainosus: Secondary | ICD-10-CM

## 2018-11-26 MED ORDER — RIZATRIPTAN BENZOATE 10 MG PO TBDP: ORAL_TABLET | ORAL | 2 refills | Status: DC

## 2018-12-10 ENCOUNTER — Other Ambulatory Visit: Payer: Self-pay | Admitting: Family Medicine

## 2018-12-24 ENCOUNTER — Other Ambulatory Visit: Payer: Self-pay | Admitting: Physician Assistant

## 2018-12-27 ENCOUNTER — Other Ambulatory Visit: Payer: Self-pay | Admitting: Family Medicine

## 2018-12-27 DIAGNOSIS — I10 Essential (primary) hypertension: Secondary | ICD-10-CM

## 2019-01-09 DIAGNOSIS — Z13820 Encounter for screening for osteoporosis: Secondary | ICD-10-CM | POA: Diagnosis not present

## 2019-01-09 DIAGNOSIS — M8588 Other specified disorders of bone density and structure, other site: Secondary | ICD-10-CM | POA: Diagnosis not present

## 2019-01-12 ENCOUNTER — Other Ambulatory Visit: Payer: Self-pay | Admitting: Physician Assistant

## 2019-01-13 NOTE — Telephone Encounter (Signed)
Apt 01/13

## 2019-01-15 ENCOUNTER — Telehealth: Payer: Self-pay | Admitting: Physician Assistant

## 2019-01-16 ENCOUNTER — Ambulatory Visit: Payer: BC Managed Care – PPO | Admitting: Physician Assistant

## 2019-01-16 NOTE — Telephone Encounter (Signed)
Error

## 2019-01-26 ENCOUNTER — Other Ambulatory Visit: Payer: Self-pay | Admitting: Family Medicine

## 2019-01-26 DIAGNOSIS — G43909 Migraine, unspecified, not intractable, without status migrainosus: Secondary | ICD-10-CM

## 2019-02-11 ENCOUNTER — Other Ambulatory Visit: Payer: Self-pay | Admitting: Physician Assistant

## 2019-02-12 NOTE — Telephone Encounter (Signed)
Apt 02/16 

## 2019-02-13 DIAGNOSIS — H1130 Conjunctival hemorrhage, unspecified eye: Secondary | ICD-10-CM | POA: Diagnosis not present

## 2019-02-19 ENCOUNTER — Encounter: Payer: Self-pay | Admitting: Physician Assistant

## 2019-02-19 ENCOUNTER — Ambulatory Visit (INDEPENDENT_AMBULATORY_CARE_PROVIDER_SITE_OTHER): Payer: BC Managed Care – PPO | Admitting: Physician Assistant

## 2019-02-19 DIAGNOSIS — F319 Bipolar disorder, unspecified: Secondary | ICD-10-CM

## 2019-02-19 DIAGNOSIS — Z79899 Other long term (current) drug therapy: Secondary | ICD-10-CM | POA: Diagnosis not present

## 2019-02-19 DIAGNOSIS — G4700 Insomnia, unspecified: Secondary | ICD-10-CM

## 2019-02-19 DIAGNOSIS — F902 Attention-deficit hyperactivity disorder, combined type: Secondary | ICD-10-CM

## 2019-02-19 MED ORDER — AMPHETAMINE-DEXTROAMPHET ER 25 MG PO CP24: 25.0000 mg | ORAL_CAPSULE | ORAL | 0 refills | Status: DC

## 2019-02-19 MED ORDER — AMPHETAMINE-DEXTROAMPHET ER 25 MG PO CP24: ORAL_CAPSULE | ORAL | 0 refills | Status: DC

## 2019-02-19 MED ORDER — QUETIAPINE FUMARATE 50 MG PO TABS: 150.0000 mg | ORAL_TABLET | Freq: Every day | ORAL | 0 refills | Status: DC

## 2019-02-19 NOTE — Progress Notes (Signed)
[facility]  Patient ID: Grace Butler,  MRN: [id number]  PCP: Pearline Cables, MD  Date of Evaluation: 02/19/2019 Time spent:30 minutes  Chief Complaint:  Chief Complaint    ADD; Insomnia; Medication Refill     Virtual Visit via Telephone Note  I connected with patient by a video enabled telemedicine application or telephone, with their informed consent, and verified patient privacy and that I am speaking with the correct person using two identifiers.  I am private, in my office and the patient is at work.  I discussed the limitations, risks, security and privacy concerns of performing an evaluation and management service by telephone and the availability of in person appointments. I also discussed with the patient that there may be a patient responsible charge related to this service. The patient expressed understanding and agreed to proceed.   I discussed the assessment and treatment plan with the patient. The patient was provided an opportunity to ask questions and all were answered. The patient agreed with the plan and demonstrated an understanding of the instructions.   The patient was advised to call back or seek an in-person evaluation if the symptoms worsen or if the condition fails to improve as anticipated.  I provided 30 minutes of non-face-to-face time during this encounter.  HISTORY/CURRENT STATUS: HPI For routine med check.   Not sleeping well for the past 4 nights.  Unknown reason why.  Up until that time, she had been sleeping pretty well.  Her mind is racing and she cannot fall asleep but then when she does, she often wakes up, has ruminating thoughts and unable to go back to sleep.  She might be getting about 5 hours of sleep every night.  Also complains of being more irritable for the past 2 months or so.  No specific triggers.  She denies increased energy with decreased need for sleep.  No impulsivity or risky behavior.  No increased spending or  libido.  No grandiosity.  No hallucinations.  Patient denies loss of interest in usual activities and is able to enjoy things.  Denies decreased energy or motivation.  Appetite has not changed.  No extreme sadness, tearfulness, or feelings of hopelessness.  Denies any changes in concentration, making decisions or remembering things.  Denies suicidal or homicidal thoughts.  She rarely has anxiety.  She does have the Xanax on hand to take when flying.  The last time she flew, she did not even need it.  States she has plenty of Xanax on hand now.  States that attention is good without easy distractibility.  Able to focus on things and finish tasks to completion.   Review of Systems  Constitutional: Positive for malaise/fatigue.  HENT: Negative.   Eyes: Negative.   Respiratory: Negative.   Cardiovascular: Negative.   Gastrointestinal: Negative.   Genitourinary: Negative.   Musculoskeletal: Negative.   Skin: Negative.   Neurological: Negative.   Endo/Heme/Allergies: Negative.   Psychiatric/Behavioral: The patient has insomnia.    Individual Medical History/ Review of Systems: Changes? :No    Past medications for mental health diagnoses include: Adderall XR, Seroquel, Abilify, trazodone, Serzone, Elavil, Xanax, Ambien caused vivid nightmares  Allergies: Coconut oil, Sulfa antibiotics, and Trazodone and nefazodone  Current Medications:  Current Outpatient Medications:  .  ADDERALL XR 25 MG 24 hr capsule, TAKE 1 CAPSULE BY MOUTH EVERY MORNING, Disp: 30 capsule, Rfl: 0 .  alendronate (FOSAMAX) 70 MG tablet, Take 70 mg by mouth., Disp: , Rfl:  .  amitriptyline (ELAVIL) 50 MG tablet, TAKE 1 TABLET BY MOUTH EVERY EVENING, Disp: 90 tablet, Rfl: 1 .  [START ON 05/09/2019] amphetamine-dextroamphetamine (ADDERALL XR) 25 MG 24 hr capsule, Take 1 capsule by mouth every morning., Disp: 30 capsule, Rfl: 0 .  [START ON 04/10/2019] amphetamine-dextroamphetamine (ADDERALL XR) 25 MG 24 hr capsule, Take 1  capsule by mouth each morning, Disp: 30 capsule, Rfl: 0 .  [START ON 03/11/2019] amphetamine-dextroamphetamine (ADDERALL XR) 25 MG 24 hr capsule, Take 1 capsule by mouth every morning., Disp: 30 capsule, Rfl: 0 .  ARIPiprazole (ABILIFY) 15 MG tablet, Take 1 tablet (15 mg total) by mouth daily., Disp: 90 tablet, Rfl: 1 .  Ascorbic Acid (VITAMIN C) 100 MG tablet, Take 100 mg by mouth daily., Disp: , Rfl:  .  calcium citrate-vitamin D (CITRACAL+D) 315-200 MG-UNIT tablet, Take 1 tablet by mouth 2 (two) times daily., Disp: , Rfl:  .  cholecalciferol (VITAMIN D) 1000 UNITS tablet, Take 1,000 Units by mouth daily., Disp: , Rfl:  .  levothyroxine (SYNTHROID) 150 MCG tablet, TAKE 1 TABLET BY MOUTH EVERY OTHER DAY (ALTERNATING WITH ), Disp: 90 tablet, Rfl: 3 .  levothyroxine (SYNTHROID) 175 MCG tablet, TAKE 1 TABLET BY MOUTH EVERY OTHER DAY ALTERNATING WITH TABS, Disp: 30 tablet, Rfl: 6 .  lisinopril (ZESTRIL) 10 MG tablet, TAKE 1 TABLET BY MOUTH EVERY DAY, Disp: 90 tablet, Rfl: 1 .  omeprazole (PRILOSEC) 40 MG capsule, TAKE 1 CAPSULE BY MOUTH 2 TIMES DAILY FOR ACID REFLUX, Disp: 180 capsule, Rfl: 3 .  QUEtiapine (SEROQUEL) 50 MG tablet, Take 3 tablets (150 mg total) by mouth at bedtime., Disp: 270 tablet, Rfl: 0 .  rizatriptan (MAXALT-MLT) 10 MG disintegrating tablet, TAKE 1 TABLET BY MOUTH AT ONSET OF HEADACHE MAY REPEAT ONCE IN 2 HOURS IF NEEDED, Disp: 12 tablet, Rfl: 2 .  Specialty Vitamins Products (MAGNESIUM, AMINO ACID CHELATE,) 133 MG tablet, Take 1 tablet by mouth 2 (two) times daily., Disp: , Rfl:  .  Tofacitinib Citrate 5 MG TABS, Take 1 tablet by mouth 2 (two) times daily. , Disp: , Rfl:  .  ALPRAZolam (XANAX) 1 MG tablet, 1 po before flying prn (Patient not taking: Reported on 08/17/2018), Disp: 10 tablet, Rfl: 0 .  amoxicillin-clavulanate (AUGMENTIN) 875-125 MG tablet, Take 1 tablet by mouth 2 (two) times daily., Disp: 20 tablet, Rfl: 0 .  dicyclomine (BENTYL) 10 MG capsule, Take 1  capsule (10 mg total) by mouth daily. (Patient not taking: Reported on 02/19/2019), Disp: 30 capsule, Rfl: 3 Medication Side Effects: none  Family Medical/ Social History: Changes? No  MENTAL HEALTH EXAM:  There were no vitals taken for this visit.There is no height or weight on file to calculate BMI.  General Appearance: Unable to assess  Eye Contact:  Unable to assess  Speech:  Clear and Coherent  Volume:  Normal  Mood:  Euthymic  Affect:  Unable to assess  Thought Process:  Goal Directed and Descriptions of Associations: Intact  Orientation:  Full (Time, Place, and Person)  Thought Content: Logical   Suicidal Thoughts:  No  Homicidal Thoughts:  No  Memory:  WNL  Judgement:  Good  Insight:  Good  Psychomotor Activity:  Unable to assess  Concentration:  Concentration: Good and Attention Span: Good  Recall:  Good  Fund of Knowledge: Good  Language: Good  Assets:  Desire for Improvement  ADL's:  Intact  Cognition: WNL  Prognosis:  Good  10/11/2018 Glucose 82, see other labs on chart.  DIAGNOSES:    ICD-10-CM   1. Bipolar I disorder ([place])  F31.9   2. Encounter for long-term (current) use of medications  Z79.899 Lipid panel  3. Insomnia, unspecified type  G47.00   4. Attention deficit hyperactivity disorder (ADHD), combined type  F90.2     Receiving Psychotherapy: No    RECOMMENDATIONS:  PDMP was reviewed. I spent 30 minutes with her. We discussed different options to help with sleep.  Because she is also having trouble with irritability, I recommend that we increase the Seroquel slightly.  She thinks that is a good idea as well.  If that is not effective for sleep after 2 to 3 days, she can call and I will will prescribe Sonata.  We discussed the benefits, risks, side effects and she accepts those. She understands that being on 2 atypical antipsychotics may increase risks of side effects but prefers to continue the current regimen. Increase Seroquel to 150 mg p.o.  nightly.   Continue Adderall XR 25 mg p.o. every morning. Continue Abilify 15 mg p.o. every morning. Continue Xanax 1 mg daily as needed for flying.  She takes this extremely rarely. Have PCP draw a fasting lipid panel at upcoming physical and send results to me. Return in 4 to 6 weeks.   Donnal Moat, PA-C

## 2019-03-15 ENCOUNTER — Other Ambulatory Visit: Payer: Self-pay | Admitting: Family Medicine

## 2019-03-15 DIAGNOSIS — I10 Essential (primary) hypertension: Secondary | ICD-10-CM

## 2019-03-19 DIAGNOSIS — M0600 Rheumatoid arthritis without rheumatoid factor, unspecified site: Secondary | ICD-10-CM | POA: Diagnosis not present

## 2019-03-19 DIAGNOSIS — Z79899 Other long term (current) drug therapy: Secondary | ICD-10-CM | POA: Diagnosis not present

## 2019-03-19 DIAGNOSIS — M76819 Anterior tibial syndrome, unspecified leg: Secondary | ICD-10-CM | POA: Diagnosis not present

## 2019-03-19 DIAGNOSIS — M7521 Bicipital tendinitis, right shoulder: Secondary | ICD-10-CM | POA: Diagnosis not present

## 2019-03-19 DIAGNOSIS — M7520 Bicipital tendinitis, unspecified shoulder: Secondary | ICD-10-CM | POA: Diagnosis not present

## 2019-03-19 DIAGNOSIS — M79673 Pain in unspecified foot: Secondary | ICD-10-CM | POA: Diagnosis not present

## 2019-03-19 DIAGNOSIS — M25521 Pain in right elbow: Secondary | ICD-10-CM | POA: Diagnosis not present

## 2019-03-19 DIAGNOSIS — E039 Hypothyroidism, unspecified: Secondary | ICD-10-CM | POA: Diagnosis not present

## 2019-03-19 DIAGNOSIS — M810 Age-related osteoporosis without current pathological fracture: Secondary | ICD-10-CM | POA: Diagnosis not present

## 2019-03-19 DIAGNOSIS — E559 Vitamin D deficiency, unspecified: Secondary | ICD-10-CM | POA: Diagnosis not present

## 2019-03-19 DIAGNOSIS — Z6841 Body Mass Index (BMI) 40.0 and over, adult: Secondary | ICD-10-CM | POA: Diagnosis not present

## 2019-03-19 DIAGNOSIS — M069 Rheumatoid arthritis, unspecified: Secondary | ICD-10-CM | POA: Diagnosis not present

## 2019-03-19 DIAGNOSIS — E669 Obesity, unspecified: Secondary | ICD-10-CM | POA: Diagnosis not present

## 2019-04-03 ENCOUNTER — Encounter: Payer: Self-pay | Admitting: Family Medicine

## 2019-04-22 ENCOUNTER — Encounter: Payer: Self-pay | Admitting: Family Medicine

## 2019-04-23 ENCOUNTER — Other Ambulatory Visit: Payer: Self-pay

## 2019-04-23 ENCOUNTER — Emergency Department (HOSPITAL_BASED_OUTPATIENT_CLINIC_OR_DEPARTMENT_OTHER)
Admission: EM | Admit: 2019-04-23 | Discharge: 2019-04-23 | Disposition: A | Discharge status: Home / Self Care | Payer: BC Managed Care – PPO | Attending: Emergency Medicine | Admitting: Emergency Medicine

## 2019-04-23 ENCOUNTER — Encounter (HOSPITAL_BASED_OUTPATIENT_CLINIC_OR_DEPARTMENT_OTHER): Payer: Self-pay | Admitting: Emergency Medicine

## 2019-04-23 ENCOUNTER — Emergency Department (HOSPITAL_BASED_OUTPATIENT_CLINIC_OR_DEPARTMENT_OTHER): Payer: BC Managed Care – PPO

## 2019-04-23 DIAGNOSIS — Z888 Allergy status to other drugs, medicaments and biological substances status: Secondary | ICD-10-CM | POA: Diagnosis not present

## 2019-04-23 DIAGNOSIS — Z79899 Other long term (current) drug therapy: Secondary | ICD-10-CM | POA: Diagnosis not present

## 2019-04-23 DIAGNOSIS — Z882 Allergy status to sulfonamides status: Secondary | ICD-10-CM | POA: Insufficient documentation

## 2019-04-23 DIAGNOSIS — I10 Essential (primary) hypertension: Secondary | ICD-10-CM | POA: Insufficient documentation

## 2019-04-23 DIAGNOSIS — H539 Unspecified visual disturbance: Secondary | ICD-10-CM

## 2019-04-23 DIAGNOSIS — Z91018 Allergy to other foods: Secondary | ICD-10-CM | POA: Diagnosis not present

## 2019-04-23 DIAGNOSIS — E039 Hypothyroidism, unspecified: Secondary | ICD-10-CM | POA: Diagnosis not present

## 2019-04-23 DIAGNOSIS — Z885 Allergy status to narcotic agent status: Secondary | ICD-10-CM | POA: Diagnosis not present

## 2019-04-23 DIAGNOSIS — G43109 Migraine with aura, not intractable, without status migrainosus: Secondary | ICD-10-CM | POA: Diagnosis not present

## 2019-04-23 DIAGNOSIS — M069 Rheumatoid arthritis, unspecified: Secondary | ICD-10-CM | POA: Diagnosis not present

## 2019-04-23 DIAGNOSIS — Z87891 Personal history of nicotine dependence: Secondary | ICD-10-CM | POA: Diagnosis not present

## 2019-04-23 DIAGNOSIS — G43909 Migraine, unspecified, not intractable, without status migrainosus: Secondary | ICD-10-CM | POA: Insufficient documentation

## 2019-04-23 DIAGNOSIS — H538 Other visual disturbances: Secondary | ICD-10-CM | POA: Diagnosis not present

## 2019-04-23 HISTORY — DX: Bipolar disorder, unspecified: F31.9

## 2019-04-23 LAB — COMPREHENSIVE METABOLIC PANEL
ALT: 33 U/L (ref 0–44)
AST: 27 U/L (ref 15–41)
Albumin: 4 g/dL (ref 3.5–5.0)
Alkaline Phosphatase: 71 U/L (ref 38–126)
Anion gap: 9 (ref 5–15)
BUN: 11 mg/dL (ref 6–20)
CO2: 28 mmol/L (ref 22–32)
Calcium: 9.9 mg/dL (ref 8.9–10.3)
Chloride: 104 mmol/L (ref 98–111)
Creatinine, Ser: 0.95 mg/dL (ref 0.44–1.00)
GFR calc Af Amer: >60 mL/min (ref >60)
GFR calc non Af Amer: >60 mL/min (ref >60)
Glucose, Bld: 82 mg/dL (ref 70–99)
Potassium: 4.2 mmol/L (ref 3.5–5.1)
Sodium: 141 mmol/L (ref 135–145)
Total Bilirubin: 0.8 mg/dL (ref 0.3–1.2)
Total Protein: 7.5 g/dL (ref 6.5–8.1)

## 2019-04-23 LAB — ETHANOL: Alcohol, Ethyl (B): <10 mg/dL (ref <10)

## 2019-04-23 LAB — DIFFERENTIAL
Abs Immature Granulocytes: 0.03 10*3/uL (ref 0.00–0.07)
Basophils Absolute: 0 10*3/uL (ref 0.0–0.1)
Basophils Relative: 1 %
Eosinophils Absolute: 0.1 10*3/uL (ref 0.0–0.5)
Eosinophils Relative: 2 %
Immature Granulocytes: 1 %
Lymphocytes Relative: 28 %
Lymphs Abs: 1.6 10*3/uL (ref 0.7–4.0)
Monocytes Absolute: 0.4 10*3/uL (ref 0.1–1.0)
Monocytes Relative: 8 %
Neutro Abs: 3.5 10*3/uL (ref 1.7–7.7)
Neutrophils Relative %: 60 %

## 2019-04-23 LAB — CBC
HCT: 42.6 % (ref 36.0–46.0)
Hemoglobin: 13.7 g/dL (ref 12.0–15.0)
MCH: 31.6 pg (ref 26.0–34.0)
MCHC: 32.2 g/dL (ref 30.0–36.0)
MCV: 98.4 fL (ref 80.0–100.0)
Platelets: 308 10*3/uL (ref 150–400)
RBC: 4.33 MIL/uL (ref 3.87–5.11)
RDW: 12.9 % (ref 11.5–15.5)
WBC: 5.6 10*3/uL (ref 4.0–10.5)
nRBC: 0 % (ref 0.0–0.2)

## 2019-04-23 LAB — URINALYSIS, MICROSCOPIC (REFLEX)

## 2019-04-23 LAB — PROTIME-INR
INR: 1 (ref 0.8–1.2)
Prothrombin Time: 12.9 seconds (ref 11.4–15.2)

## 2019-04-23 LAB — RAPID URINE DRUG SCREEN, HOSP PERFORMED
Amphetamines: NOT DETECTED
Barbiturates: NOT DETECTED
Benzodiazepines: NOT DETECTED
Cocaine: NOT DETECTED
Opiates: NOT DETECTED
Tetrahydrocannabinol: NOT DETECTED

## 2019-04-23 LAB — URINALYSIS, ROUTINE W REFLEX MICROSCOPIC
Bilirubin Urine: NEGATIVE
Glucose, UA: NEGATIVE mg/dL
Hgb urine dipstick: NEGATIVE
Ketones, ur: NEGATIVE mg/dL
Nitrite: NEGATIVE
Protein, ur: NEGATIVE mg/dL
Specific Gravity, Urine: 1.015 (ref 1.005–1.030)
pH: 8.5 — ABNORMAL HIGH (ref 5.0–8.0)

## 2019-04-23 LAB — APTT: aPTT: 25 seconds (ref 24–36)

## 2019-04-23 LAB — PREGNANCY, URINE: Preg Test, Ur: NEGATIVE

## 2019-04-23 MED ORDER — DIPHENHYDRAMINE HCL 25 MG PO CAPS
25.0000 mg | ORAL_CAPSULE | Freq: Once | ORAL | Status: AC
  Administered 2019-04-23: 25 mg via ORAL
  Filled 2019-04-23: qty 1

## 2019-04-23 MED ORDER — KETOROLAC TROMETHAMINE 15 MG/ML IJ SOLN
15.0000 mg | Freq: Once | INTRAMUSCULAR | Status: AC
  Administered 2019-04-23: 15 mg via INTRAVENOUS
  Filled 2019-04-23: qty 1

## 2019-04-23 MED ORDER — DEXAMETHASONE SODIUM PHOSPHATE 10 MG/ML IJ SOLN
10.0000 mg | Freq: Once | INTRAMUSCULAR | Status: AC
  Administered 2019-04-23: 10 mg via INTRAVENOUS
  Filled 2019-04-23: qty 1

## 2019-04-23 MED ORDER — METOCLOPRAMIDE HCL 5 MG/ML IJ SOLN
10.0000 mg | Freq: Once | INTRAMUSCULAR | Status: AC
  Administered 2019-04-23: 10 mg via INTRAVENOUS
  Filled 2019-04-23: qty 2

## 2019-04-23 MED ORDER — SODIUM CHLORIDE 0.9 % IV SOLN
INTRAVENOUS | Status: DC | PRN
  Administered 2019-04-23: 250 mL via INTRAVENOUS

## 2019-04-23 NOTE — ED Provider Notes (Signed)
[facility] EMERGENCY DEPARTMENT Provider Note   CSN: [id number] Arrival date & time: 04/23/19  0258     History Chief Complaint  Patient presents with  . Eye Problem    Grace Butler is a 49 y.o. female presenting for evaluation of visual abnormality.  Patient states at 750 this morning she had acute onset of visual abnormality.  She describes it as seeing a C-shaped area of shiny/distorted/kaleidoscope-like images.  She denies vision loss.  Since 750, the area has enlarged, but otherwise has not changed.  She sees this with both eyes, even when the other is covered.  She denies history of similar.  She denies headache.  She denies trauma or injury to the head of the eyes.  She denies lightheadedness, dizziness, speech changes, numbness, tingling, weakness.  She denies recent fevers, chills, chest pain, shortness breath, cough, nausea, vomiting, abdominal pain, urinary symptoms, normal bowel movements.  She denies recent medication changes.  She has not taken anything for her symptoms.  She sees an optometrist, but not an ophthalmologist.  She reports a history of migraines, and will occasionally see floaters prior to the migraine, but states this is different.  Additional history came for chart review.  Patient with a history of hypertension, obesity, thyroid disease, depression, bipolar, ADHD, RA, migraines.  HPI     Past Medical History:  Diagnosis Date  . Bipolar 1 disorder (HCC)   . Chronic headache   . Depression   . Gallstone   . Hypertension   . IBS (irritable bowel syndrome)   . Obesity   . Thyroid disease   . Ulcer     Patient Active Problem List   Diagnosis Date Noted  . Attention deficit hyperactivity disorder (ADHD) 09/28/2017  . Insomnia, unspecified 09/28/2017  . Plantar fasciitis of left foot 01/17/2017  . Left knee pain 09/01/2016  . Obesity 01/14/2016  . Rheumatoid arthritis (HCC) 01/14/2016  . Hypothyroidism due to acquired atrophy of  thyroid 01/14/2016  . Hormone replacement therapy 01/14/2016  . Osteopenia 09/15/2015  . Bipolar 1 disorder (HCC) 08/21/2015  . Gastroesophageal reflux disease without esophagitis 08/21/2015  . Migraine 08/21/2015    Past Surgical History:  Procedure Laterality Date  . ABDOMINAL HYSTERECTOMY    . APPENDECTOMY    . CHOLECYSTECTOMY    . COLONOSCOPY  09/07/2018  . CYSTECTOMY     dermoid  . ESOPHAGOGASTRODUODENOSCOPY     about 20 years ago to check for ulcers  . MYOMECTOMY    . 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 77  . UPPER GASTROINTESTINAL ENDOSCOPY       OB History   No obstetric history on file.     Family History  Problem Relation Age of Onset  . Colon cancer Neg Hx   . Esophageal cancer Neg Hx   . Colon polyps Neg Hx   . Rectal cancer Neg Hx   . Stomach cancer Neg Hx     Social History   Tobacco Use  . Smoking status: Former Smoker    Packs/day: 0.50    Years: 20.00    Pack years: 10.00    Quit date: 01/16/2014    Years since quitting: 5.2  . Smokeless tobacco: Never Used  Substance Use Topics  . Alcohol use: Not Currently  . Drug use: No    Home Medications Prior to Admission medications   Medication Sig Start Date End Date Taking? Authorizing Provider  ADDERALL XR 25 MG 24 hr capsule TAKE  1 CAPSULE BY MOUTH EVERY MORNING 02/12/19   Melony Overly T, PA-C  alendronate (FOSAMAX) 70 MG tablet Take 70 mg by mouth. 08/31/16   [provider]  ALPRAZolam Prudy Feeler) 1 MG tablet 1 po before flying prn Patient not taking: Reported on 08/17/2018 10/12/17   Melony Overly T, PA-C  amitriptyline (ELAVIL) 50 MG tablet TAKE 1 TABLET BY MOUTH EVERY EVENING 01/28/19   Copland, Gwenlyn Found, MD  amoxicillin-clavulanate (AUGMENTIN) 875-125 MG tablet Take 1 tablet by mouth 2 (two) times daily. 09/24/18   Copland, Gwenlyn Found, MD  amphetamine-dextroamphetamine (ADDERALL XR) 25 MG 24 hr capsule Take 1 capsule by mouth every morning. 05/09/19   Cherie Ouch, PA-C    amphetamine-dextroamphetamine (ADDERALL XR) 25 MG 24 hr capsule Take 1 capsule by mouth each morning 04/10/19   Melony Overly T, PA-C  amphetamine-dextroamphetamine (ADDERALL XR) 25 MG 24 hr capsule Take 1 capsule by mouth every morning. 03/11/19   Melony Overly T, PA-C  ARIPiprazole (ABILIFY) 15 MG tablet Take 1 tablet (15 mg total) by mouth daily. 10/12/18   Cherie Ouch, PA-C  Ascorbic Acid (VITAMIN C) 100 MG tablet Take 100 mg by mouth daily.    [provider]  calcium citrate-vitamin D (CITRACAL+D) 315-200 MG-UNIT tablet Take 1 tablet by mouth 2 (two) times daily.    [provider]  cholecalciferol (VITAMIN D) 1000 UNITS tablet Take 1,000 Units by mouth daily.    [provider]  dicyclomine (BENTYL) 10 MG capsule Take 1 capsule (10 mg total) by mouth daily. Patient not taking: Reported on 02/19/2019 08/17/18   Lynann Bologna, MD  levothyroxine (SYNTHROID) 150 MCG tablet TAKE 1 TABLET BY MOUTH EVERY OTHER DAY (ALTERNATING WITH ) 10/29/18   Copland, Gwenlyn Found, MD  levothyroxine (SYNTHROID) 175 MCG tablet TAKE 1 TABLET BY MOUTH EVERY OTHER DAY ALTERNATING WITH TABS 10/29/18   Copland, Gwenlyn Found, MD  lisinopril (ZESTRIL) 10 MG tablet TAKE 1 TABLET BY MOUTH EVERY DAY 03/15/19   Copland, Gwenlyn Found, MD  omeprazole (PRILOSEC) 40 MG capsule TAKE 1 CAPSULE BY MOUTH 2 TIMES DAILY FOR ACID REFLUX 12/11/18   Copland, Gwenlyn Found, MD  QUEtiapine (SEROQUEL) 50 MG tablet Take 3 tablets (150 mg total) by mouth at bedtime. 02/19/19   Melony Overly T, PA-C  rizatriptan (MAXALT-MLT) 10 MG disintegrating tablet TAKE 1 TABLET BY MOUTH AT ONSET OF HEADACHE MAY REPEAT ONCE IN 2 HOURS IF NEEDED 11/26/18   Copland, Gwenlyn Found, MD  Specialty Vitamins Products (MAGNESIUM, AMINO ACID CHELATE,) 133 MG tablet Take 1 tablet by mouth 2 (two) times daily.    [provider]  Tofacitinib Citrate 5 MG TABS Take 1 tablet by mouth 2 (two) times daily.     [provider]     Allergies    Coconut oil, Codeine, Sulfa antibiotics, and Trazodone and nefazodone  Review of Systems   Review of Systems  Eyes: Positive for visual disturbance.  All other systems reviewed and are negative.   Physical Exam Updated Vital Signs BP 126/82 (BP Location: Left Arm)   Pulse 83   Temp 97.7 F (36.5 C) (Oral)   Resp 15   Ht 5\' 8"  (1.727 m)   Wt (!) 143.4 kg   SpO2 98%   BMI 48.06 kg/m   Physical Exam Vitals and nursing note reviewed.  Constitutional:      General: She is not in acute distress.    Appearance: She is well-developed. She is obese.  Comments: Obese female sitting comfortably in the bed no acute distress  HENT:     Head: Normocephalic and atraumatic.  Eyes:     Extraocular Movements: Extraocular movements intact.     Conjunctiva/sclera: Conjunctivae normal.     Pupils: Pupils are equal, round, and reactive to light.      Comments: Visual abnormality is a crescent shape located towards the left side of both visual fields.  Cardiovascular:     Rate and Rhythm: Normal rate and regular rhythm.     Pulses: Normal pulses.  Pulmonary:     Effort: Pulmonary effort is normal. No respiratory distress.     Breath sounds: Normal breath sounds. No wheezing.  Abdominal:     General: There is no distension.     Palpations: Abdomen is soft. There is no mass.     Tenderness: There is no abdominal tenderness. There is no guarding or rebound.  Musculoskeletal:        General: Normal range of motion.     Cervical back: Normal range of motion and neck supple.     Right lower leg: No edema.     Left lower leg: No edema.  Skin:    General: Skin is warm and dry.     Capillary Refill: Capillary refill takes less than 2 seconds.  Neurological:     General: No focal deficit present.     Mental Status: She is alert and oriented to person, place, and time.     GCS: GCS eye subscore is 4. GCS verbal subscore is 5. GCS motor subscore is 6.     Cranial Nerves:  Cranial nerves are intact.     Sensory: Sensation is intact.     Motor: Motor function is intact.     Coordination: Coordination is intact.     Comments: No focal neurologic deficits.  CN intact.  Nose to finger intact.  Fine movement and coronation intact.  Strength and sensation intact x4.     ED Results / Procedures / Treatments   Labs (all labs ordered are listed, but only abnormal results are displayed) Labs Reviewed  URINALYSIS, ROUTINE W REFLEX MICROSCOPIC - Abnormal; Notable for the following components:      Result Value   pH 8.5 (*)    Leukocytes,Ua TRACE (*)    All other components within normal limits  URINALYSIS, MICROSCOPIC (REFLEX) - Abnormal; Notable for the following components:   Bacteria, UA MANY (*)    All other components within normal limits  ETHANOL  PROTIME-INR  APTT  CBC  DIFFERENTIAL  COMPREHENSIVE METABOLIC PANEL  RAPID URINE DRUG SCREEN, HOSP PERFORMED  PREGNANCY, URINE    EKG EKG Interpretation  Date/Time:  Tuesday April 23 2019 10:03:49 EDT Ventricular Rate:  76 PR Interval:    QRS Duration: 99 QT Interval:  413 QTC Calculation: 465 R Axis:   2 Text Interpretation: Sinus rhythm Low voltage, precordial leads No old tracing to compare Confirmed by Meridee Score [phone]) on 04/23/2019 10:06:51 AM   Radiology CT Head Wo Contrast  Result Date: 04/23/2019 CLINICAL DATA:  Vision changes. EXAM: CT HEAD WITHOUT CONTRAST TECHNIQUE: Contiguous axial images were obtained from the base of the skull through the vertex without intravenous contrast. COMPARISON:  None. FINDINGS: Brain: No evidence of acute infarction, hemorrhage, hydrocephalus, extra-axial collection or mass lesion/mass effect. Vascular: No hyperdense vessel or unexpected calcification. Skull: Normal. Negative for fracture or focal lesion. Sinuses/Orbits: No acute finding. Other: None. IMPRESSION: Normal head CT. Electronically Signed  By: Marijo Conception M.D.   On: 04/23/2019 10:23     Procedures Procedures (including critical care time)  Medications Ordered in ED Medications  0.9 %  sodium chloride infusion ( Intravenous Stopped 04/23/19 1125)  ketorolac (TORADOL) 15 MG/ML injection 15 mg (15 mg Intravenous Given 04/23/19 1055)  metoCLOPramide (REGLAN) injection 10 mg (10 mg Intravenous Given 04/23/19 1120)  dexamethasone (DECADRON) injection 10 mg (10 mg Intravenous Given 04/23/19 1123)  diphenhydrAMINE (BENADRYL) capsule 25 mg (25 mg Oral Given 04/23/19 1050)    ED Course  I have reviewed the triage vital signs and the nursing notes.  Pertinent labs & imaging results that were available during my care of the patient were reviewed by me and considered in my medical decision making (see chart for details).  Clinical Course as of Apr 22 1148  Tue Apr 23, 2039  5047 49 year old female with acute onset of binocular visual disturbance in the lateral left field.  Prior history of migraines but not this phenomenon.  Usually just has floaters.  No associated headache now.  Intact neuro exam.  Symptoms improving here.  Getting labs CT imaging.  Will give migraine cocktail if symptoms persist.   [MB]    Clinical Course User Index [MB] Hayden Rasmussen, MD   MDM Rules/Calculators/A&P                      Presenting for evaluation of visual abnormalities.  On exam, patient appears nontoxic.  No obvious neurologic deficits.  Abnormality of vision is present in both eyes.  No associated headache.  However considering patient's history of migraine, consider complex migraine.  Exam is not consistent with stroke at this time, code stroke was not called.  Case discussed with attending, Dr. Melina Copa agrees to plan.  Will consult with neurology regarding best imaging options, because of patient needs an MRI, she will need to be transferred.  Discussed with Dr. Malen Gauze from neurology who recommends CT head.  If normal, recommends headache cocktail.  If symptoms do not completely resolve,  recommends rule out for pseudotumor cerebri.  On reassessment, patient reports symptoms are improving.  She states the C-shaped abnormality is not as migraine or obvious.  It has not completely resolved.  CT head negative for acute findings, as such, will give headache cocktail and reassess.  Labs interpreted by me, overall reassuring.  No leukocytosis.  Electrolytes stable.  Hemoglobin stable.  On reassessment after headache cocktail, visual symptoms have completely resolved.  Patient has no pain or concerns at this time.  As such, likely complex migraine.  Discussed findings with patient.  Discussed follow-up with neurology as needed.  At this time, patient appears safe for discharge.  Return precautions given.  Patient states she understands and agrees to plan.  Final Clinical Impression(s) / ED Diagnoses Final diagnoses:  Complicated migraine  Vision disturbance    Rx / DC Orders ED Discharge Orders    None       Franchot Heidelberg, PA-C 04/23/19 1151    Hayden Rasmussen, MD 04/23/19 1729

## 2019-04-23 NOTE — ED Notes (Signed)
ED Provider at bedside discussing test results and dispo plan of care. 

## 2019-04-23 NOTE — ED Notes (Signed)
ED Provider at bedside. 

## 2019-04-23 NOTE — ED Triage Notes (Signed)
Sudden onset of distorted vision in both eyes while working on computer this morning.  Has crescent shape prism-like object in the left side of her visual field.  Pt does have migraines but has not had one in a couple weeks.  Is still unable to read due to this visual distortion.

## 2019-04-23 NOTE — ED Notes (Signed)
Pt transported to CT ?

## 2019-04-23 NOTE — Discharge Instructions (Addendum)
Continue taking home medications as prescribed. Follow-up with neurology as needed for further evaluation and management of your symptoms. Return to the emergency room if you develop severe headaches, recurrent and persistent vision changes, numbness/weakness, speech difficulties, chest pain, or any new, worsening, or concerning symptoms.

## 2019-05-10 ENCOUNTER — Telehealth: Payer: Self-pay

## 2019-05-10 ENCOUNTER — Encounter: Payer: Self-pay | Admitting: Family Medicine

## 2019-05-10 NOTE — Telephone Encounter (Signed)
Patient called the front desk requesting for a GI cocktail to be sent in to the pharmacy for her at [facility]. She states she has had bad acid reflux or does not know if its medication related I advised the front to tell her I would need to speak with dr. Patsy Lager and see if appropriate. Advised to check mychart for any changes or messages.

## 2019-05-13 MED ORDER — GI COCKTAIL ~~LOC~~: 30.0000 mL | Freq: Two times a day (BID) | ORAL | 0 refills | Status: DC

## 2019-05-30 ENCOUNTER — Encounter: Payer: Self-pay | Admitting: Family Medicine

## 2019-05-31 ENCOUNTER — Ambulatory Visit: Payer: BC Managed Care – PPO | Admitting: Internal Medicine

## 2019-05-31 ENCOUNTER — Other Ambulatory Visit: Payer: Self-pay

## 2019-05-31 ENCOUNTER — Encounter: Payer: Self-pay | Admitting: Internal Medicine

## 2019-05-31 ENCOUNTER — Ambulatory Visit: Payer: BC Managed Care – PPO | Admitting: Family

## 2019-05-31 VITALS — BP 124/82 | HR 75 | Resp 17 | Ht 68.0 in | Wt 310.0 lb

## 2019-05-31 DIAGNOSIS — K140 Glossitis: Secondary | ICD-10-CM | POA: Diagnosis not present

## 2019-05-31 NOTE — Progress Notes (Signed)
Subjective:    Patient ID: Grace Butler, female    DOB: 08/12/1970, 49 y.o.   MRN: [id number]  DOS:  05/31/2019 Type of visit - description: Acute 3 days ago noted few white bumps on her tongue. The next day her tongue was hurting and burning. The bumps become small ulcers. She is using Orajel that seems to help with the discomfort   Review of Systems No fever chills No runny nose or sore throat No allergy symptoms Not taking any new medicines  Past Medical History:  Diagnosis Date  . Bipolar 1 disorder (HCC)   . Chronic headache   . Depression   . Gallstone   . Hypertension   . IBS (irritable bowel syndrome)   . Obesity   . Thyroid disease   . Ulcer     Past Surgical History:  Procedure Laterality Date  . ABDOMINAL HYSTERECTOMY    . APPENDECTOMY    . CHOLECYSTECTOMY    . COLONOSCOPY  09/07/2018  . CYSTECTOMY     dermoid  . ESOPHAGOGASTRODUODENOSCOPY     about 20 years ago to check for ulcers  . MYOMECTOMY    . 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 76  . UPPER GASTROINTESTINAL ENDOSCOPY      Allergies as of 05/31/2019      Reactions   Coconut Oil Anaphylaxis   Codeine    Sulfa Antibiotics Hives   Trazodone And Nefazodone    Vivid dreams      Medication List       Accurate as of May 31, 2019  4:11 PM. If you have any questions, ask your nurse or doctor.        STOP taking these medications   amoxicillin-clavulanate 875-125 MG tablet Commonly known as: AUGMENTIN Stopped by: Willow Ora, MD     TAKE these medications   Adderall XR 25 MG 24 hr capsule Generic drug: amphetamine-dextroamphetamine TAKE 1 CAPSULE BY MOUTH EVERY MORNING   amphetamine-dextroamphetamine 25 MG 24 hr capsule Commonly known as: Adderall XR Take 1 capsule by mouth every morning.   amphetamine-dextroamphetamine 25 MG 24 hr capsule Commonly known as: Adderall XR Take 1 capsule by mouth each morning   amphetamine-dextroamphetamine 25 MG 24 hr capsule Commonly known as: Adderall  XR Take 1 capsule by mouth every morning.   alendronate 70 MG tablet Commonly known as: FOSAMAX Take 70 mg by mouth.   ALPRAZolam 1 MG tablet Commonly known as: Xanax 1 po before flying prn   amitriptyline 50 MG tablet Commonly known as: ELAVIL TAKE 1 TABLET BY MOUTH EVERY EVENING   ARIPiprazole 15 MG tablet Commonly known as: ABILIFY Take 1 tablet (15 mg total) by mouth daily.   calcium citrate-vitamin D 315-200 MG-UNIT tablet Commonly known as: CITRACAL+D Take 1 tablet by mouth 2 (two) times daily.   cholecalciferol 1000 units tablet Commonly known as: VITAMIN D Take 1,000 Units by mouth daily.   dicyclomine 10 MG capsule Commonly known as: BENTYL Take 1 capsule (10 mg total) by mouth daily.   gi cocktail Susp suspension Take 30 mLs by mouth 2 (two) times daily. Shake well.   levothyroxine 150 MCG tablet Commonly known as: SYNTHROID TAKE 1 TABLET BY MOUTH EVERY OTHER DAY (ALTERNATING WITH )   levothyroxine 175 MCG tablet Commonly known as: SYNTHROID TAKE 1 TABLET BY MOUTH EVERY OTHER DAY ALTERNATING WITH TABS   lisinopril 10 MG tablet Commonly known as: ZESTRIL TAKE 1 TABLET BY MOUTH EVERY DAY  magnesium (amino acid chelate) 133 MG tablet Take 1 tablet by mouth 2 (two) times daily.   omeprazole 40 MG capsule Commonly known as: PRILOSEC TAKE 1 CAPSULE BY MOUTH 2 TIMES DAILY FOR ACID REFLUX   QUEtiapine 50 MG tablet Commonly known as: SEROQUEL Take 3 tablets (150 mg total) by mouth at bedtime.   rizatriptan 10 MG disintegrating tablet Commonly known as: MAXALT-MLT TAKE 1 TABLET BY MOUTH AT ONSET OF HEADACHE MAY REPEAT ONCE IN 2 HOURS IF NEEDED   Tofacitinib Citrate 5 MG Tabs Take 1 tablet by mouth 2 (two) times daily.   vitamin C 100 MG tablet Take 100 mg by mouth daily.          Objective:   Physical Exam BP 124/82 (BP Location: Right Arm, Patient Position: Sitting, Cuff Size: Large)   Pulse 75   Resp 17   Ht 5\' 8"  (1.727 m)    Wt (!) 310 lb (140.6 kg)   SpO2 98%   BMI 47.14 kg/m      General:   Well developed, NAD, BMI noted. HEENT:  Normocephalic . Face symmetric, atraumatic. Oral cavity: Several missing teeth but the ones present seem to be in good health. Gums normal. Oral membranes normal Tongue: No redness, no swelling: She indeed has few, very small (1 to 2 mm) white very shallow ulcers at the tip and the sides of the tongue. Neck: No lymphadenopathies Lips: Normal Skin: Not pale. Not jaundice Neurologic:  alert & oriented X3.  Speech normal, gait appropriate for age and unassisted Psych--  Cognition and judgment appear intact.  Cooperative with normal attention span and concentration.  Behavior appropriate. No anxious or depressed appearing.     Assessment     49 year old female, PMH includes bipolar disorder, HTN, IBS, thyroid disease, migraine headaches, hysterectomy, cholecystectomy, presents with  Glossitis: Subtle glossitis, viral? For now recommend to continue taking Orajel as a symptomatic treatment, this is probably a self-limited issue, if she is not better she is to let us know. We did talk about possibly Valtrex trial  but we agree on above plan.  This visit occurred during the SARS-CoV-2 public health emergency.  Safety protocols were in place, including screening questions prior to the visit, additional usage of staff PPE, and extensive cleaning of exam room while observing appropriate contact time as indicated for disinfecting solutions.

## 2019-05-31 NOTE — Patient Instructions (Signed)
Continue using Orajel  Good hydration  Call if not completely well in few days, call if the symptoms worsen

## 2019-06-20 ENCOUNTER — Other Ambulatory Visit: Payer: Self-pay | Admitting: Physician Assistant

## 2019-06-20 ENCOUNTER — Telehealth: Payer: Self-pay | Admitting: Physician Assistant

## 2019-06-20 MED ORDER — AMPHETAMINE-DEXTROAMPHET ER 25 MG PO CP24: ORAL_CAPSULE | ORAL | 0 refills | Status: DC

## 2019-06-20 NOTE — Telephone Encounter (Signed)
I sent it in already.  I did generic though so I hope that is okay.

## 2019-06-20 NOTE — Telephone Encounter (Signed)
Pt has appt tomorrow and intends on keeping it, but realized that she is out of her Adderall today. Can we send in her RX today? To [facility]

## 2019-06-20 NOTE — Telephone Encounter (Signed)
Can you please refuse, you already sent it

## 2019-06-21 ENCOUNTER — Telehealth (INDEPENDENT_AMBULATORY_CARE_PROVIDER_SITE_OTHER): Payer: BC Managed Care – PPO | Admitting: Physician Assistant

## 2019-06-21 ENCOUNTER — Encounter: Payer: Self-pay | Admitting: Physician Assistant

## 2019-06-21 DIAGNOSIS — Z566 Other physical and mental strain related to work: Secondary | ICD-10-CM

## 2019-06-21 DIAGNOSIS — F411 Generalized anxiety disorder: Secondary | ICD-10-CM | POA: Diagnosis not present

## 2019-06-21 DIAGNOSIS — F319 Bipolar disorder, unspecified: Secondary | ICD-10-CM | POA: Diagnosis not present

## 2019-06-21 DIAGNOSIS — F902 Attention-deficit hyperactivity disorder, combined type: Secondary | ICD-10-CM

## 2019-06-21 DIAGNOSIS — G2581 Restless legs syndrome: Secondary | ICD-10-CM

## 2019-06-21 MED ORDER — QUETIAPINE FUMARATE 50 MG PO TABS: 150.0000 mg | ORAL_TABLET | Freq: Every day | ORAL | 1 refills | Status: DC

## 2019-06-21 MED ORDER — ARIPIPRAZOLE 15 MG PO TABS: 15.0000 mg | ORAL_TABLET | Freq: Every day | ORAL | 1 refills | Status: DC

## 2019-06-21 MED ORDER — GABAPENTIN 300 MG PO CAPS: ORAL_CAPSULE | ORAL | 1 refills | Status: DC

## 2019-06-21 MED ORDER — AMPHETAMINE-DEXTROAMPHET ER 25 MG PO CP24: 25.0000 mg | ORAL_CAPSULE | ORAL | 0 refills | Status: DC

## 2019-06-21 NOTE — Progress Notes (Signed)
[facility]  Patient ID: Grace Butler,  MRN: [id number]  PCP: Darreld Mclean, MD  Date of Evaluation: 06/21/2019 Time spent:30 minutes  Chief Complaint:  Chief Complaint    Anxiety; ADHD     Virtual Visit via Telephone or Video Note  I connected with patient by a video enabled telemedicine application or telephone, with their informed consent, and verified patient privacy and that I am speaking with the correct person using two identifiers.  I am private, in my office and the patient is in her car.  I discussed the limitations, risks, security and privacy concerns of performing an evaluation and management service by video and the availability of in person appointments. I also discussed with the patient that there may be a patient responsible charge related to this service. The patient expressed understanding and agreed to proceed.   I discussed the assessment and treatment plan with the patient. The patient was provided an opportunity to ask questions and all were answered. The patient agreed with the plan and demonstrated an understanding of the instructions.   The patient was advised to call back or seek an in-person evaluation if the symptoms worsen or if the condition fails to improve as anticipated.  I provided 30 minutes of non-face-to-face time during this encounter.  HPI: For routine med check.  She is under a lot of stress right now.  The summer is her busiest season at work and she is working 12 hours almost daily, plus some on weekends. Is very anxious, pushing to get things done. Not really panic attacks but more of feeling of edginess most of the time. Has some old Xanax for emergencies only, but hasn't taken it b/c it's so old.  Also having restlessness in her legs after she lays down every evening.  She takes the Seroquel around 8 and gets in bed by 9.  She has to get up at 5:15 so she goes to bed early.  For the past 3 weeks or so, she has had a  weird feeling in her legs bilaterally and she has to move them to try to settle down that feeling.  It does not help.  She knows she is not going to sleep until after 10 every night.  It is very uncomfortable.  States that attention is good without easy distractibility.  Able to focus on things and finish tasks to completion.   Patient denies loss of interest in usual activities and is able to enjoy things.  Denies decreased energy or motivation.  Appetite has not changed.  No extreme sadness, tearfulness, or feelings of hopelessness.  Denies any changes in concentration, making decisions or remembering things.  Denies suicidal or homicidal thoughts.  Denies dizziness, syncope, seizures, numbness, tingling, tremor, tics, unsteady gait, slurred speech, confusion. Denies muscle or joint pain, stiffness, or dystonia.  Individual Medical History/ Review of Systems: Changes? :No    Past medications for mental health diagnoses include: Adderall XR, Seroquel, Abilify, trazodone, Serzone, Elavil, Xanax, Ambien caused vivid nightmares  Allergies: Coconut oil, Codeine, Sulfa antibiotics, and Trazodone and nefazodone  Current Medications:  Current Outpatient Medications:    alendronate (FOSAMAX) 70 MG tablet, Take 70 mg by mouth., Disp: , Rfl:    amitriptyline (ELAVIL) 50 MG tablet, TAKE 1 TABLET BY MOUTH EVERY EVENING, Disp: 90 tablet, Rfl: 1   amphetamine-dextroamphetamine (ADDERALL XR) 25 MG 24 hr capsule, TAKE 1 CAPSULE BY MOUTH EVERY MORNING, Disp: 30 capsule, Rfl: 0   [START ON 07/20/2019]  amphetamine-dextroamphetamine (ADDERALL XR) 25 MG 24 hr capsule, Take 1 capsule by mouth every morning., Disp: 30 capsule, Rfl: 0   [START ON 08/19/2019] amphetamine-dextroamphetamine (ADDERALL XR) 25 MG 24 hr capsule, Take 1 capsule by mouth every morning., Disp: 30 capsule, Rfl: 0   ARIPiprazole (ABILIFY) 15 MG tablet, Take 1 tablet (15 mg total) by mouth daily., Disp: 90 tablet, Rfl: 1   Ascorbic Acid  (VITAMIN C) 100 MG tablet, Take 100 mg by mouth daily., Disp: , Rfl:    calcium citrate-vitamin D (CITRACAL+D) 315-200 MG-UNIT tablet, Take 1 tablet by mouth 2 (two) times daily., Disp: , Rfl:    cholecalciferol (VITAMIN D) 1000 UNITS tablet, Take 1,000 Units by mouth daily., Disp: , Rfl:    dicyclomine (BENTYL) 10 MG capsule, Take 1 capsule (10 mg total) by mouth daily., Disp: 30 capsule, Rfl: 3   levothyroxine (SYNTHROID) 150 MCG tablet, TAKE 1 TABLET BY MOUTH EVERY OTHER DAY (ALTERNATING WITH ), Disp: 90 tablet, Rfl: 3   levothyroxine (SYNTHROID) 175 MCG tablet, TAKE 1 TABLET BY MOUTH EVERY OTHER DAY ALTERNATING WITH TABS, Disp: 30 tablet, Rfl: 6   lisinopril (ZESTRIL) 10 MG tablet, TAKE 1 TABLET BY MOUTH EVERY DAY, Disp: 90 tablet, Rfl: 1   omeprazole (PRILOSEC) 40 MG capsule, TAKE 1 CAPSULE BY MOUTH 2 TIMES DAILY FOR ACID REFLUX, Disp: 180 capsule, Rfl: 3   QUEtiapine (SEROQUEL) 50 MG tablet, Take 3 tablets (150 mg total) by mouth at bedtime., Disp: 270 tablet, Rfl: 1   rizatriptan (MAXALT-MLT) 10 MG disintegrating tablet, TAKE 1 TABLET BY MOUTH AT ONSET OF HEADACHE MAY REPEAT ONCE IN 2 HOURS IF NEEDED, Disp: 12 tablet, Rfl: 2   Specialty Vitamins Products (MAGNESIUM, AMINO ACID CHELATE,) 133 MG tablet, Take 1 tablet by mouth 2 (two) times daily., Disp: , Rfl:    Tofacitinib Citrate 5 MG TABS, Take 1 tablet by mouth 2 (two) times daily. , Disp: , Rfl:    ALPRAZolam (XANAX) 1 MG tablet, 1 po before flying prn (Patient not taking: Reported on 06/21/2019), Disp: 10 tablet, Rfl: 0   Alum & Mag Hydroxide-Simeth (GI COCKTAIL) SUSP suspension, Take 30 mLs by mouth 2 (two) times daily. Shake well. (Patient not taking: Reported on 06/21/2019), Disp: 300 mL, Rfl: 0   gabapentin (NEURONTIN) 300 MG capsule, 1 po q am, 1 po at noon, 2 po about 3 hours before bed., Disp: 120 capsule, Rfl: 1 Medication Side Effects: none  Family Medical/ Social History: Changes? No  MENTAL HEALTH  EXAM:  There were no vitals taken for this visit.There is no height or weight on file to calculate BMI.  General Appearance: Casual, Neat and Well Groomed  Eye Contact:  Good  Speech:  Clear and Coherent  Volume:  Normal  Mood:  Euthymic  Affect:  Appropriate  Thought Process:  Goal Directed and Descriptions of Associations: Intact  Orientation:  Full (Time, Place, and Person)  Thought Content: Logical   Suicidal Thoughts:  No  Homicidal Thoughts:  No  Memory:  WNL  Judgement:  Good  Insight:  Good  Psychomotor Activity:  Normal  Concentration:  Concentration: Good and Attention Span: Good  Recall:  Good  Fund of Knowledge: Good  Language: Good  Assets:  Desire for Improvement  ADL's:  Intact  Cognition: WNL  Prognosis:  Good    DIAGNOSES:    ICD-10-CM   1. Restless leg syndrome  G25.81   2. Bipolar I disorder (HCC)  F31.9   3. Attention  deficit hyperactivity disorder (ADHD), combined type  F90.2   4. Generalized anxiety disorder  F41.1   5. Work stress  Z56.6     Receiving Psychotherapy: No    RECOMMENDATIONS:  PDMP was reviewed. We discussed the anxiety and the restless leg symptoms.  I recommend gabapentin because that should help both problems.  We also discussed self-care, getting enough sleep, massages or what ever makes her feel better.  Exercise and diet also play a role.  Benefits, risks, side effects were discussed and she accepts. Start gabapentin 300 mg, 1 p.o. nightly today and then increase to 1 p.o. every morning, 1 at noon, and 2 pills around 3 to 4 hours before she goes to bed. Continue amitriptyline 50 mg, 1 p.o. nightly. Continue Adderall X are 25 mg p.o. every morning. Continue Xanax 1 mg, 1 p.o. daily as needed before flying. Continue Abilify 15 mg, 1 p.o. every morning. Continue Seroquel 50 mg, 3 p.o. nightly. Return in 4 weeks.  Melony Overly, PA-C

## 2019-07-27 ENCOUNTER — Encounter: Payer: Self-pay | Admitting: Family Medicine

## 2019-07-29 ENCOUNTER — Telehealth: Payer: Self-pay

## 2019-07-29 MED ORDER — SAXENDA 18 MG/3ML ~~LOC~~ SOPN: PEN_INJECTOR | SUBCUTANEOUS | 3 refills | Status: DC

## 2019-07-29 MED ORDER — PEN NEEDLES 31G X 6 MM MISC: 1.0000 | Freq: Every day | 99 refills | Status: DC

## 2019-07-29 NOTE — Telephone Encounter (Signed)
PA initiated via Covermymeds; KEY: [id number]. Awaiting determination.

## 2019-07-30 NOTE — Telephone Encounter (Signed)
PA approved.   Effective from 07/29/2019 through 12/01/2019.

## 2019-08-03 ENCOUNTER — Other Ambulatory Visit: Payer: Self-pay | Admitting: Family Medicine

## 2019-08-03 DIAGNOSIS — G43909 Migraine, unspecified, not intractable, without status migrainosus: Secondary | ICD-10-CM

## 2019-08-09 ENCOUNTER — Ambulatory Visit (HOSPITAL_BASED_OUTPATIENT_CLINIC_OR_DEPARTMENT_OTHER)
Admission: RE | Admit: 2019-08-09 | Discharge: 2019-08-09 | Disposition: A | Discharge status: Home / Self Care | Payer: BC Managed Care – PPO | Source: Ambulatory Visit | Attending: Medical | Admitting: Medical

## 2019-08-09 ENCOUNTER — Ambulatory Visit: Payer: BC Managed Care – PPO | Admitting: Medical

## 2019-08-09 ENCOUNTER — Other Ambulatory Visit: Payer: Self-pay

## 2019-08-09 ENCOUNTER — Telehealth: Payer: Self-pay | Admitting: Medical

## 2019-08-09 VITALS — BP 135/77 | HR 86 | Resp 18 | Ht 68.0 in | Wt 305.8 lb

## 2019-08-09 DIAGNOSIS — M79671 Pain in right foot: Secondary | ICD-10-CM | POA: Insufficient documentation

## 2019-08-09 DIAGNOSIS — L989 Disorder of the skin and subcutaneous tissue, unspecified: Secondary | ICD-10-CM | POA: Diagnosis not present

## 2019-08-09 DIAGNOSIS — M795 Residual foreign body in soft tissue: Secondary | ICD-10-CM

## 2019-08-09 MED ORDER — AMOXICILLIN-POT CLAVULANATE 875-125 MG PO TABS: 1.0000 | ORAL_TABLET | Freq: Two times a day (BID) | ORAL | 0 refills | Status: DC

## 2019-08-09 NOTE — Progress Notes (Signed)
   Subjective:    Patient ID: Grace Butler, female    DOB: [birth year], 49 y.o.   MRN: [id number]  HPI  Pt in for heel pain. Pt states area started to get tender about 2 weeks ago. Pt remembers around the time that are started she dropped glass on kitchen floor and also walked on deck barefoot.    Review of Systems     Objective:   Physical Exam  General- no acute distress. Right foot-on the right side of heel she has small slightly raised area that is very tender to palpation on light touch.  No obvious redness.  No fluctuance.  On initial inspection almost looks like a plantar wart versus corn.      Assessment & Plan:  For heel pain with possible fb history will rx augmentin as degree of pain you have cause concern for fb with possible infection.  Will get xray to see if FB seen.   Use celebrex and tylenol for pain if needed.  Will go ahead and refer to podiatrist. If fb seen then they can decide if needs removal. But if wart then procedure to remove can vary.  Follow up xray and will update you by my chart later today or tomorrow.  Did see possible foreign body on radiology summary report.  Did go ahead and put the referral and send note to referral staff members.    Time spent with patient today was 25  minutes which consisted of discussing diagnosis, work up, treatment, referal and documentation.

## 2019-08-09 NOTE — Patient Instructions (Addendum)
For heel pain with possible fb history will rx Augmentin as degree of pain you have cause concern for fb with possible infection.  Will get xray to see if FB seen.   Use Celebrex and tylenol for pain if needed.  Will go ahead and refer to podiatrist. If fb seen then they can decide if needs removal. But if wart then procedure to remove can vary. Other differential discussed.  Follow up xray and will update you by my chart later today or tomorrow.

## 2019-08-09 NOTE — Telephone Encounter (Signed)
Patient has possible tiny foreign body right heel area.  I put in referral to see podiatry for possible removal.  Please see referral and see if we can get her in very early next week.  I want to be in the office next week but do want her to be seen quickly.

## 2019-08-12 NOTE — Telephone Encounter (Signed)
Referral has already been sent to [facility] for urgent appt request

## 2019-08-12 NOTE — Telephone Encounter (Signed)
TFA had a cancelation at  845 tomorrow (Tuesday) Grace Butler stated she was not able to make it She will call directly to get an appointment

## 2019-08-13 ENCOUNTER — Ambulatory Visit: Payer: BC Managed Care – PPO | Admitting: Podiatry

## 2019-08-16 ENCOUNTER — Other Ambulatory Visit: Payer: Self-pay

## 2019-08-16 ENCOUNTER — Encounter: Payer: Self-pay | Admitting: Sports Medicine

## 2019-08-16 ENCOUNTER — Ambulatory Visit (INDEPENDENT_AMBULATORY_CARE_PROVIDER_SITE_OTHER): Payer: BC Managed Care – PPO

## 2019-08-16 ENCOUNTER — Ambulatory Visit: Payer: BC Managed Care – PPO | Admitting: Sports Medicine

## 2019-08-16 ENCOUNTER — Other Ambulatory Visit: Payer: Self-pay | Admitting: Sports Medicine

## 2019-08-16 DIAGNOSIS — S90851A Superficial foreign body, right foot, initial encounter: Secondary | ICD-10-CM

## 2019-08-16 DIAGNOSIS — M79671 Pain in right foot: Secondary | ICD-10-CM

## 2019-08-16 DIAGNOSIS — L570 Actinic keratosis: Secondary | ICD-10-CM

## 2019-08-16 NOTE — Patient Instructions (Addendum)
Soak with warm water and Epson salt 1/4 cup for 20 minutes once daily then dress area with antibiotic cream and Band-Aid for 1 week or until symptoms improve  Take erythromycin antibiotic as prescribed

## 2019-08-16 NOTE — Progress Notes (Signed)
Subjective: Grace Butler is a 49 y.o. female patient who presents to office for evaluation of right foot pain. Patient complains of progressive pain especially over the last really gotten worse reports that she had some pain starting last month and does recall breaking a frame and reports that she tends to also walk barefoot out on the back deck and feels like there is something sticking her in the back of the heel.  Patient went to PCP on last week and they did an x-ray and told her that she has a foreign body there and referred her here and gave her antibiotics.  Patient reports that she is up-to-date on her tetanus status.  Patient has tried soaking with salt water, mouthwash, and Dawn dish soap with no relief in symptoms. Patient denies any other pedal complaints.   Review of Systems  All other systems reviewed and are negative.   Patient Active Problem List   Diagnosis Date Noted   Attention deficit hyperactivity disorder (ADHD) 09/28/2017   Insomnia, unspecified 09/28/2017   Plantar fasciitis of left foot 01/17/2017   Left knee pain 09/01/2016   Obesity 01/14/2016   Rheumatoid arthritis (HCC) 01/14/2016   Hypothyroidism due to acquired atrophy of thyroid 01/14/2016   Hormone replacement therapy 01/14/2016   Osteopenia 09/15/2015   Bipolar 1 disorder (HCC) 08/21/2015   Gastroesophageal reflux disease without esophagitis 08/21/2015   Migraine 08/21/2015    Current Outpatient Medications on File Prior to Visit  Medication Sig Dispense Refill   alendronate (FOSAMAX) 70 MG tablet Take 70 mg by mouth.     Alum & Mag Hydroxide-Simeth (GI COCKTAIL) SUSP suspension Take 30 mLs by mouth 2 (two) times daily. Shake well. (Patient not taking: Reported on 06/21/2019) 300 mL 0   amitriptyline (ELAVIL) 50 MG tablet TAKE 1 TABLET BY MOUTH EVERY EVENING 90 tablet 0   amoxicillin-clavulanate (AUGMENTIN) 875-125 MG tablet Take 1 tablet by mouth 2 (two) times daily. 20 tablet 0    amphetamine-dextroamphetamine (ADDERALL XR) 25 MG 24 hr capsule TAKE 1 CAPSULE BY MOUTH EVERY MORNING 30 capsule 0   amphetamine-dextroamphetamine (ADDERALL XR) 25 MG 24 hr capsule Take 1 capsule by mouth every morning. 30 capsule 0   [START ON 08/19/2019] amphetamine-dextroamphetamine (ADDERALL XR) 25 MG 24 hr capsule Take 1 capsule by mouth every morning. 30 capsule 0   ARIPiprazole (ABILIFY) 15 MG tablet Take 1 tablet (15 mg total) by mouth daily. 90 tablet 1   Ascorbic Acid (VITAMIN C) 100 MG tablet Take 100 mg by mouth daily.     calcium citrate-vitamin D (CITRACAL+D) 315-200 MG-UNIT tablet Take 1 tablet by mouth 2 (two) times daily.     cholecalciferol (VITAMIN D) 1000 UNITS tablet Take 1,000 Units by mouth daily.     gabapentin (NEURONTIN) 300 MG capsule 1 po q am, 1 po at noon, 2 po about 3 hours before bed. 120 capsule 1   Insulin Pen Needle (PEN NEEDLES) 31G X 6 MM MISC 1 each by Does not apply route daily. 50 each prn   levothyroxine (SYNTHROID) 150 MCG tablet TAKE 1 TABLET BY MOUTH EVERY OTHER DAY (ALTERNATING WITH ) 90 tablet 3   levothyroxine (SYNTHROID) 175 MCG tablet TAKE 1 TABLET BY MOUTH EVERY OTHER DAY ALTERNATING WITH TABS 30 tablet 6   Liraglutide -Weight Management (SAXENDA) 18 MG/3ML SOPN Start with 0.6 mg subcu daily, increase by 0.6 mg weekly to a goal dose of 3 mg daily 15 mL 3   lisinopril (ZESTRIL) 10  MG tablet TAKE 1 TABLET BY MOUTH EVERY DAY 90 tablet 1   omeprazole (PRILOSEC) 40 MG capsule TAKE 1 CAPSULE BY MOUTH 2 TIMES DAILY FOR ACID REFLUX 180 capsule 3   QUEtiapine (SEROQUEL) 50 MG tablet Take 3 tablets (150 mg total) by mouth at bedtime. 270 tablet 1   rizatriptan (MAXALT-MLT) 10 MG disintegrating tablet TAKE 1 TABLET BY MOUTH AT ONSET OF HEADACHE MAY REPEAT ONCE IN 2 HOURS IF NEEDED 12 tablet 2   Specialty Vitamins Products (MAGNESIUM, AMINO ACID CHELATE,) 133 MG tablet Take 1 tablet by mouth 2 (two) times daily.     Tofacitinib  Citrate 5 MG TABS Take 1 tablet by mouth 2 (two) times daily.      No current facility-administered medications on file prior to visit.    Allergies  Allergen Reactions   Coconut Oil Anaphylaxis   Codeine    Sulfa Antibiotics Hives   Trazodone And Nefazodone     Vivid dreams    Objective:  General: Alert and oriented x3 in no acute distress  Dermatology: No open lesions bilateral lower extremities, no webspace macerations, no ecchymosis bilateral, all nails x 10 are well manicured.  There is a callus over lesion noted at the plantar lateral right heel with no surrounding erythema, minimal swelling, there is a white nucleated core to the center and to do pain with palpation possible site of foreign body versus intractable keratoma.  Vascular: Dorsalis Pedis and Posterior Tibial pedal pulses palpable, Capillary Fill Time 3 seconds,(+) pedal hair growth bilateral, no edema bilateral lower extremities, Temperature gradient within normal limits.  Neurology: Michaell Cowing sensation intact via light touch bilateral.   Musculoskeletal: There is moderate tenderness to palpation at the right plantar lateral heel.  Strength within normal limits in all groups bilateral.   Gait: Antalgic gait  Xrays    Impression: No residual foreign body   Assessment and Plan: Problem List Items Addressed This Visit    None    Visit Diagnoses    Foreign body in right foot, initial encounter    -  Primary   Pain of right heel       Keratosis          -Complete examination performed -Xrays reviewed -Discussed treatement options for foreign body vs painful keratoma -After aseptic prep, using a 15 blade debrided keratosis at right heel and removed a small fragment of glass vs splinter, hemostasis achieved with manuel pressure and applied antibiotic cream and dry sterile dressing. Advised patient to redress daily with the same for 1 week and to soak with epsom salt for any soreness -Continue with erthromycin  as rx by PCP -Patient is up to date on tetanus status -Patient to return to office in 2-3 weeks for wound check or sooner if condition worsens.  Asencion Islam, DPM

## 2019-08-19 ENCOUNTER — Encounter: Payer: Self-pay | Admitting: Family Medicine

## 2019-08-29 DIAGNOSIS — Z1231 Encounter for screening mammogram for malignant neoplasm of breast: Secondary | ICD-10-CM | POA: Diagnosis not present

## 2019-08-29 LAB — HM MAMMOGRAPHY

## 2019-08-30 ENCOUNTER — Ambulatory Visit: Payer: BC Managed Care – PPO | Admitting: Sports Medicine

## 2019-08-30 ENCOUNTER — Other Ambulatory Visit: Payer: Self-pay

## 2019-08-30 ENCOUNTER — Encounter: Payer: Self-pay | Admitting: Sports Medicine

## 2019-08-30 DIAGNOSIS — L570 Actinic keratosis: Secondary | ICD-10-CM | POA: Diagnosis not present

## 2019-08-30 DIAGNOSIS — S90851D Superficial foreign body, right foot, subsequent encounter: Secondary | ICD-10-CM

## 2019-08-30 DIAGNOSIS — M79671 Pain in right foot: Secondary | ICD-10-CM

## 2019-08-30 NOTE — Progress Notes (Signed)
Subjective: Grace Butler is a 49 y.o. female patient who presents to office for follow-up evaluation of right heel reports that it is healing well still a little sore at the area of previous foreign body removal with a redness a few days ago but no longer red now.  Patient denies nausea vomiting fever chills or any other this time.  Patient denies any other pedal complaints.   Patient Active Problem List   Diagnosis Date Noted   Attention deficit hyperactivity disorder (ADHD) 09/28/2017   Insomnia, unspecified 09/28/2017   Plantar fasciitis of left foot 01/17/2017   Left knee pain 09/01/2016   Obesity 01/14/2016   Rheumatoid arthritis (HCC) 01/14/2016   Hypothyroidism due to acquired atrophy of thyroid 01/14/2016   Hormone replacement therapy 01/14/2016   Osteopenia 09/15/2015   Bipolar 1 disorder (HCC) 08/21/2015   Gastroesophageal reflux disease without esophagitis 08/21/2015   Migraine 08/21/2015    Current Outpatient Medications on File Prior to Visit  Medication Sig Dispense Refill   alendronate (FOSAMAX) 70 MG tablet Take 70 mg by mouth.     Alum & Mag Hydroxide-Simeth (GI COCKTAIL) SUSP suspension Take 30 mLs by mouth 2 (two) times daily. Shake well. (Patient not taking: Reported on 06/21/2019) 300 mL 0   amitriptyline (ELAVIL) 50 MG tablet TAKE 1 TABLET BY MOUTH EVERY EVENING 90 tablet 0   amoxicillin-clavulanate (AUGMENTIN) 875-125 MG tablet Take 1 tablet by mouth 2 (two) times daily. 20 tablet 0   amphetamine-dextroamphetamine (ADDERALL XR) 25 MG 24 hr capsule TAKE 1 CAPSULE BY MOUTH EVERY MORNING 30 capsule 0   amphetamine-dextroamphetamine (ADDERALL XR) 25 MG 24 hr capsule Take 1 capsule by mouth every morning. 30 capsule 0   amphetamine-dextroamphetamine (ADDERALL XR) 25 MG 24 hr capsule Take 1 capsule by mouth every morning. 30 capsule 0   ARIPiprazole (ABILIFY) 15 MG tablet Take 1 tablet (15 mg total) by mouth daily. 90 tablet 1   Ascorbic Acid  (VITAMIN C) 100 MG tablet Take 100 mg by mouth daily.     calcium citrate-vitamin D (CITRACAL+D) 315-200 MG-UNIT tablet Take 1 tablet by mouth 2 (two) times daily.     cholecalciferol (VITAMIN D) 1000 UNITS tablet Take 1,000 Units by mouth daily.     gabapentin (NEURONTIN) 300 MG capsule 1 po q am, 1 po at noon, 2 po about 3 hours before bed. 120 capsule 1   Insulin Pen Needle (PEN NEEDLES) 31G X 6 MM MISC 1 each by Does not apply route daily. 50 each prn   levothyroxine (SYNTHROID) 150 MCG tablet TAKE 1 TABLET BY MOUTH EVERY OTHER DAY (ALTERNATING WITH ) 90 tablet 3   levothyroxine (SYNTHROID) 175 MCG tablet TAKE 1 TABLET BY MOUTH EVERY OTHER DAY ALTERNATING WITH TABS 30 tablet 6   Liraglutide -Weight Management (SAXENDA) 18 MG/3ML SOPN Start with 0.6 mg subcu daily, increase by 0.6 mg weekly to a goal dose of 3 mg daily 15 mL 3   lisinopril (ZESTRIL) 10 MG tablet TAKE 1 TABLET BY MOUTH EVERY DAY 90 tablet 1   omeprazole (PRILOSEC) 40 MG capsule TAKE 1 CAPSULE BY MOUTH 2 TIMES DAILY FOR ACID REFLUX 180 capsule 3   QUEtiapine (SEROQUEL) 50 MG tablet Take 3 tablets (150 mg total) by mouth at bedtime. 270 tablet 1   rizatriptan (MAXALT-MLT) 10 MG disintegrating tablet TAKE 1 TABLET BY MOUTH AT ONSET OF HEADACHE MAY REPEAT ONCE IN 2 HOURS IF NEEDED 12 tablet 2   Specialty Vitamins Products (MAGNESIUM, AMINO  ACID CHELATE,) 133 MG tablet Take 1 tablet by mouth 2 (two) times daily.     Tofacitinib Citrate 5 MG TABS Take 1 tablet by mouth 2 (two) times daily.      No current facility-administered medications on file prior to visit.    Allergies  Allergen Reactions   Coconut Oil Anaphylaxis   Codeine    Sulfa Antibiotics Hives   Trazodone And Nefazodone     Vivid dreams    Objective:  General: Alert and oriented x3 in no acute distress  Dermatology: There is minimal surrounding keratotic tissue at the plantar lateral right heel with small specks of dried blood from  previous removal of foreign body, there is mild pain is present with direct pressure to the lesion with a central bloody nucleated core noted, no webspace macerations, no ecchymosis bilateral, all nails x 10 are well manicured.  Vascular: Dorsalis Pedis and Posterior Tibial pedal pulses 2/4, Capillary Fill Time 3 seconds, + pedal hair growth bilateral, no edema bilateral lower extremities, Temperature gradient within normal limits.  Neurology: Michaell Cowing sensation intact via light touch bilateral.  Musculoskeletal: Mild tenderness with palpation at the keratotic lesion site on Right heel, Muscular strength 5/5 in all groups without pain or limitation on range of motion. No lower extremity muscular or boney deformity noted.  Assessment and Plan: Problem List Items Addressed This Visit    None    Visit Diagnoses    Foreign body in right foot, subsequent encounter    -  Primary   Pain of right heel       Keratosis          -Complete examination performed -Parred keratoic lesion in area of dried blood using a chisel/15 blade; treated the area withSalinocaine and a Band-Aid, may remove Band-Aid at end of day -Previous foreign body removal site appears to be well-healed -Encouraged to return to office if symptoms fail to continue to improve -Patient to return to office as needed or sooner if condition worsens.  Asencion Islam, DPM

## 2019-09-02 ENCOUNTER — Other Ambulatory Visit: Payer: Self-pay | Admitting: Sports Medicine

## 2019-09-02 DIAGNOSIS — S90851A Superficial foreign body, right foot, initial encounter: Secondary | ICD-10-CM

## 2019-09-03 ENCOUNTER — Telehealth: Payer: Self-pay | Admitting: Gastroenterology

## 2019-09-03 MED ORDER — DEXLANSOPRAZOLE 30 MG PO CPDR: 30.0000 mg | DELAYED_RELEASE_CAPSULE | Freq: Every day | ORAL | 3 refills | Status: DC

## 2019-09-03 NOTE — Telephone Encounter (Signed)
Pt is requesting a call back from a nurse to discuss her acid reflux, pt is requesting an appt sometime ASAP, no appts available.

## 2019-09-03 NOTE — Addendum Note (Signed)
Addended by: Abbe Amsterdam C on: 09/03/2019 01:18 PM   Modules accepted: Orders

## 2019-09-04 NOTE — Telephone Encounter (Signed)
I returned patients call, she said she spoke to someone yesterday and did not need any further assistance.

## 2019-09-11 ENCOUNTER — Encounter: Payer: Self-pay | Admitting: Family Medicine

## 2019-09-20 ENCOUNTER — Other Ambulatory Visit: Payer: Self-pay | Admitting: Physician Assistant

## 2019-09-20 NOTE — Telephone Encounter (Signed)
Darianna called to request refill of her Adderall.  Made appt for 10/26.  Send to [facility]

## 2019-09-20 NOTE — Telephone Encounter (Signed)
Apt 10/26 

## 2019-09-27 ENCOUNTER — Other Ambulatory Visit: Payer: Self-pay | Admitting: Family Medicine

## 2019-09-27 DIAGNOSIS — G43909 Migraine, unspecified, not intractable, without status migrainosus: Secondary | ICD-10-CM

## 2019-10-21 ENCOUNTER — Other Ambulatory Visit: Payer: Self-pay | Admitting: Family Medicine

## 2019-10-21 ENCOUNTER — Other Ambulatory Visit: Payer: Self-pay | Admitting: Physician Assistant

## 2019-10-21 DIAGNOSIS — I10 Essential (primary) hypertension: Secondary | ICD-10-CM

## 2019-10-22 ENCOUNTER — Other Ambulatory Visit: Payer: Self-pay | Admitting: Physician Assistant

## 2019-10-23 NOTE — Telephone Encounter (Signed)
Next apt 10/29/19

## 2019-10-23 NOTE — Telephone Encounter (Signed)
Grace Butler called to check on status of her Adderall refill.  Pharmacy requested it 10/18 and Myranda is now out and the pharmacy closes at 5pm.  Please send in her prescription.

## 2019-10-23 NOTE — Telephone Encounter (Signed)
Please refuse they sent 2 requests

## 2019-10-29 ENCOUNTER — Telehealth (INDEPENDENT_AMBULATORY_CARE_PROVIDER_SITE_OTHER): Payer: BC Managed Care – PPO | Admitting: Physician Assistant

## 2019-10-29 ENCOUNTER — Encounter: Payer: Self-pay | Admitting: Physician Assistant

## 2019-10-29 DIAGNOSIS — G43909 Migraine, unspecified, not intractable, without status migrainosus: Secondary | ICD-10-CM

## 2019-10-29 DIAGNOSIS — Z79899 Other long term (current) drug therapy: Secondary | ICD-10-CM

## 2019-10-29 DIAGNOSIS — G2581 Restless legs syndrome: Secondary | ICD-10-CM | POA: Diagnosis not present

## 2019-10-29 DIAGNOSIS — F411 Generalized anxiety disorder: Secondary | ICD-10-CM

## 2019-10-29 DIAGNOSIS — F902 Attention-deficit hyperactivity disorder, combined type: Secondary | ICD-10-CM | POA: Diagnosis not present

## 2019-10-29 DIAGNOSIS — F319 Bipolar disorder, unspecified: Secondary | ICD-10-CM

## 2019-10-29 DIAGNOSIS — G4700 Insomnia, unspecified: Secondary | ICD-10-CM

## 2019-10-29 MED ORDER — AMITRIPTYLINE HCL 50 MG PO TABS: 50.0000 mg | ORAL_TABLET | Freq: Every evening | ORAL | 1 refills | Status: DC

## 2019-10-29 NOTE — Addendum Note (Signed)
Addended by: Melony Overly T on: 10/29/2019 05:38 PM   Modules accepted: Orders, Level of Service

## 2019-10-29 NOTE — Progress Notes (Addendum)
[facility]  Patient ID: Grace Butler,  MRN: [id number]  PCP: Pearline Cables, MD  Date of Evaluation: 10/29/2019 Time spent:20 minutes  Chief Complaint:  Chief Complaint    Medication Refill     Virtual Visit via Telehealth  I connected with patient by telephone, with their informed consent, and verified patient privacy and that I am speaking with the correct person using two identifiers.  I am private, in my office and the patient is at work.  I discussed the limitations, risks, security and privacy concerns of performing an evaluation and management service by telephone and the availability of in person appointments. I also discussed with the patient that there may be a patient responsible charge related to this service. The patient expressed understanding and agreed to proceed.   I discussed the assessment and treatment plan with the patient. The patient was provided an opportunity to ask questions and all were answered. The patient agreed with the plan and demonstrated an understanding of the instructions.   The patient was advised to call back or seek an in-person evaluation if the symptoms worsen or if the condition fails to improve as anticipated.  I provided 20 minutes of non-face-to-face time during this encounter.  HISTORY/CURRENT STATUS: HPI for routine med check.  Patient presents for routine 3 month med check.  States she is doing really well.  Her medications are continuing to be effective.  Patient denies loss of interest in usual activities and is able to enjoy things.  Denies decreased energy or motivation.  Appetite has not changed.  No extreme sadness, tearfulness, or feelings of hopelessness.  Denies any changes in concentration, making decisions or remembering things.  Denies suicidal or homicidal thoughts.  Patient denies increased energy with decreased need for sleep, no increased talkativeness, no racing thoughts, no impulsivity or risky  behaviors, no increased spending, no increased libido, no grandiosity, no increased irritability or anger, and no hallucinations.  Anxiety is well controlled.  She sleeps well most of the time.  She is trying to lose weight and has lost around 20 pounds.  Denies dizziness, syncope, seizures, numbness, tingling, tremor, tics, unsteady gait, slurred speech, confusion. Denies muscle or joint pain, stiffness, or dystonia.  Individual Medical History/ Review of Systems: Changes? :No    Past medications for mental health diagnoses include: Adderall XR, Seroquel, Abilify, trazodone, Serzone, Elavil, Xanax, Ambien caused vivid nightmares  Allergies: Coconut oil, Codeine, Sulfa antibiotics, and Trazodone and nefazodone  Current Medications:  Current Outpatient Medications:  .  ADDERALL XR 25 MG 24 hr capsule, TAKE 1 CAPSULE BY MOUTH EACH MORNING, Disp: 30 capsule, Rfl: 0 .  ADDERALL XR 25 MG 24 hr capsule, TAKE 1 CAPSULE BY MOUTH EACH MORNING, Disp: 30 capsule, Rfl: 0 .  alendronate (FOSAMAX) 70 MG tablet, Take 70 mg by mouth., Disp: , Rfl:  .  amitriptyline (ELAVIL) 50 MG tablet, Take 1 tablet (50 mg total) by mouth every evening., Disp: 90 tablet, Rfl: 1 .  amphetamine-dextroamphetamine (ADDERALL XR) 25 MG 24 hr capsule, TAKE 1 CAPSULE BY MOUTH EVERY MORNING, Disp: 30 capsule, Rfl: 0 .  ARIPiprazole (ABILIFY) 15 MG tablet, Take 1 tablet (15 mg total) by mouth daily., Disp: 90 tablet, Rfl: 1 .  Ascorbic Acid (VITAMIN C) 100 MG tablet, Take 100 mg by mouth daily., Disp: , Rfl:  .  calcium citrate-vitamin D (CITRACAL+D) 315-200 MG-UNIT tablet, Take 1 tablet by mouth 2 (two) times daily., Disp: , Rfl:  .  cholecalciferol (  VITAMIN D) 1000 UNITS tablet, Take 1,000 Units by mouth daily., Disp: , Rfl:  .  Dexlansoprazole 30 MG capsule, Take 1 capsule (30 mg total) by mouth daily., Disp: 30 capsule, Rfl: 3 .  gabapentin (NEURONTIN) 300 MG capsule, 1 po q am, 1 po at noon, 2 po about 3 hours before bed.  (Patient taking differently: Take 600 mg by mouth at bedtime. ), Disp: 120 capsule, Rfl: 1 .  Insulin Pen Needle (PEN NEEDLES) 31G X 6 MM MISC, 1 each by Does not apply route daily., Disp: 50 each, Rfl: prn .  levothyroxine (SYNTHROID) 150 MCG tablet, TAKE 1 TABLET BY MOUTH EVERY OTHER DAY (ALTERNATING WITH ), Disp: 90 tablet, Rfl: 3 .  levothyroxine (SYNTHROID) 175 MCG tablet, TAKE 1 TABLET BY MOUTH EVERY OTHER DAY ALTERNATING WITH TABS, Disp: 30 tablet, Rfl: 6 .  Liraglutide -Weight Management (SAXENDA) 18 MG/3ML SOPN, Start with 0.6 mg subcu daily, increase by 0.6 mg weekly to a goal dose of 3 mg daily, Disp: 15 mL, Rfl: 3 .  lisinopril (ZESTRIL) 10 MG tablet, Take 1 tablet (10 mg total) by mouth daily., Disp: 90 tablet, Rfl: 1 .  QUEtiapine (SEROQUEL) 50 MG tablet, Take 3 tablets (150 mg total) by mouth at bedtime., Disp: 270 tablet, Rfl: 1 .  rizatriptan (MAXALT-MLT) 10 MG disintegrating tablet, TAKE 1 TABLET BY MOUTH AT ONSET OF HEADACHE. MAY REPEAT ONCE IN 2 HOURS IF NEEDED, Disp: 12 tablet, Rfl: 2 .  Tofacitinib Citrate 5 MG TABS, Take 1 tablet by mouth 2 (two) times daily. , Disp: , Rfl:  .  Alum & Mag Hydroxide-Simeth (GI COCKTAIL) SUSP suspension, Take 30 mLs by mouth 2 (two) times daily. Shake well. (Patient not taking: Reported on 06/21/2019), Disp: 300 mL, Rfl: 0 .  amoxicillin-clavulanate (AUGMENTIN) 875-125 MG tablet, Take 1 tablet by mouth 2 (two) times daily., Disp: 20 tablet, Rfl: 0 .  omeprazole (PRILOSEC) 40 MG capsule, TAKE 1 CAPSULE BY MOUTH 2 TIMES DAILY FOR ACID REFLUX (Patient not taking: Reported on 10/29/2019), Disp: 180 capsule, Rfl: 3 .  Specialty Vitamins Products (MAGNESIUM, AMINO ACID CHELATE,) 133 MG tablet, Take 1 tablet by mouth 2 (two) times daily. (Patient not taking: Reported on 10/29/2019), Disp: , Rfl:  Medication Side Effects: none  Family Medical/ Social History: Changes? Hoping to change jobs  MENTAL HEALTH EXAM:  There were no vitals taken  for this visit.There is no height or weight on file to calculate BMI.  General Appearance: unable to assess  Eye Contact:  unable to assess  Speech:  Clear and Coherent and Normal Rate  Volume:  Normal  Mood:  Euthymic  Affect:  unable to assess  Thought Process:  Goal Directed and Descriptions of Associations: Intact  Orientation:  Full (Time, Place, and Person)  Thought Content: Logical   Suicidal Thoughts:  No  Homicidal Thoughts:  No  Memory:  WNL  Judgement:  Good  Insight:  Good  Psychomotor Activity:  unable to assess  Concentration:  Concentration: Good and Attention Span: Good  Recall:  Good  Fund of Knowledge: Good  Language: Good  Assets:  Desire for Improvement  ADL's:  Intact  Cognition: WNL  Prognosis:  Good     DIAGNOSES:    ICD-10-CM   1. Attention deficit hyperactivity disorder (ADHD), combined type  F90.2   2. Bipolar I disorder (HCC)  F31.9   3. Restless leg syndrome  G25.81   4. Generalized anxiety disorder  F41.1   5.  Insomnia, unspecified type  G47.00   6. Migraine without status migrainosus, not intractable, unspecified migraine type  G43.909 amitriptyline (ELAVIL) 50 MG tablet  7. Encounter for long-term (current) use of medications  Z79.899 Lipid panel    Comprehensive metabolic panel    Hemoglobin A1c    Receiving Psychotherapy: No    RECOMMENDATIONS:  PDMP was reviewed. I provided 20 minutes of nonface-to-face time during this encounter. I am glad to hear that she is doing well. Continue Adderall XR 25 mg, 1 p.o. every morning. Continue amitriptyline 50 mg, 1 p.o. nightly. Continue Abilify 15 mg, 1 p.o. every morning. Continue gabapentin 300 mg, 2 p.o. nightly. Continue Seroquel 50 mg, 3 p.o. nightly. She has an appointment with the rheumatologist later this week and will be having labs drawn then.  I am ordering a CMP, hemoglobin A1c and fasting lipid profile to be added to those labs. Return in 6 months.  Melony Overly, PA-C

## 2019-11-01 DIAGNOSIS — M810 Age-related osteoporosis without current pathological fracture: Secondary | ICD-10-CM | POA: Diagnosis not present

## 2019-11-01 DIAGNOSIS — M7731 Calcaneal spur, right foot: Secondary | ICD-10-CM | POA: Diagnosis not present

## 2019-11-01 DIAGNOSIS — R5383 Other fatigue: Secondary | ICD-10-CM | POA: Diagnosis not present

## 2019-11-01 DIAGNOSIS — M791 Myalgia, unspecified site: Secondary | ICD-10-CM | POA: Diagnosis not present

## 2019-11-01 DIAGNOSIS — M79641 Pain in right hand: Secondary | ICD-10-CM | POA: Diagnosis not present

## 2019-11-01 DIAGNOSIS — M19072 Primary osteoarthritis, left ankle and foot: Secondary | ICD-10-CM | POA: Diagnosis not present

## 2019-11-01 DIAGNOSIS — M79642 Pain in left hand: Secondary | ICD-10-CM | POA: Diagnosis not present

## 2019-11-01 DIAGNOSIS — M0600 Rheumatoid arthritis without rheumatoid factor, unspecified site: Secondary | ICD-10-CM | POA: Diagnosis not present

## 2019-11-01 DIAGNOSIS — M7732 Calcaneal spur, left foot: Secondary | ICD-10-CM | POA: Diagnosis not present

## 2019-11-01 DIAGNOSIS — H04123 Dry eye syndrome of bilateral lacrimal glands: Secondary | ICD-10-CM | POA: Diagnosis not present

## 2019-11-14 ENCOUNTER — Other Ambulatory Visit: Payer: Self-pay | Admitting: Family Medicine

## 2019-11-14 DIAGNOSIS — E034 Atrophy of thyroid (acquired): Secondary | ICD-10-CM

## 2019-11-19 ENCOUNTER — Telehealth: Payer: Self-pay

## 2019-11-19 NOTE — Telephone Encounter (Signed)
PA initiated via Covermymeds; KEY: [id number]. Awaiting determination.

## 2019-11-20 NOTE — Telephone Encounter (Signed)
PA denied. Awaiting denial information.  °

## 2019-11-21 NOTE — Telephone Encounter (Signed)
Just an FYI. PA denied for continued coverage of Saxenda. Member has not lost 4% or more of their starting body weight.

## 2019-12-02 ENCOUNTER — Encounter: Payer: Self-pay | Admitting: Family Medicine

## 2019-12-23 ENCOUNTER — Other Ambulatory Visit: Payer: Self-pay | Admitting: Physician Assistant

## 2020-01-02 ENCOUNTER — Other Ambulatory Visit: Payer: Self-pay | Admitting: Family Medicine

## 2020-01-02 DIAGNOSIS — E034 Atrophy of thyroid (acquired): Secondary | ICD-10-CM

## 2020-01-24 ENCOUNTER — Other Ambulatory Visit: Payer: Self-pay | Admitting: Physician Assistant

## 2020-01-24 ENCOUNTER — Encounter: Payer: Self-pay | Admitting: Family Medicine

## 2020-01-27 ENCOUNTER — Telehealth: Payer: BC Managed Care – PPO | Admitting: Family Medicine

## 2020-01-27 ENCOUNTER — Other Ambulatory Visit: Payer: Self-pay

## 2020-01-27 DIAGNOSIS — R4189 Other symptoms and signs involving cognitive functions and awareness: Secondary | ICD-10-CM

## 2020-01-27 NOTE — Progress Notes (Signed)
[facility] [address] [place], [place] [address] [phone]  Date:  01/27/2020   Name:  Grace Butler   DOB:  09/27/1970   MRN:  [id number]  PCP:  Pearline Cables, MD    Chief Complaint: No chief complaint on file.   History of Present Illness:  Grace Butler is a 50 y.o. very pleasant female patient who presents with the following:  virtual visit today- pt is at home and I am at office.   Patient identified with 2 factors, she gives consent for virtual visit today.  The patient myself are present on the call  Today Grace Butler notes that she has had some unusual sensations recently-not syncope or presyncope, but a sensation of feeling like she is out of her body This has occurred for a week or so -perhaps 6 times total It may seem to last 10-15 secondly Others have noticed it occuring as well; for example, she was standing at the sink washing dishes when her partner noticed that she seemed to have zoned out of the conversation It may occur when she is sitting or standing  Never had this in the past  Never had this sort of issue as a child No falls or accidents  No HA, no CP or SOB She may notice some palpitations- this is also somewhat new The palp may last a few seconds  No headaches since she started on amitriptyline  She is not aware of any changes in her routine or medications that could be triggering this issue  She is due for thyroid level    Lab Results  Component Value Date   HGBA1C 5.5 01/14/2016   Lab Results  Component Value Date   TSH 0.28 (L) 09/08/2017      Patient Active Problem List   Diagnosis Date Noted  . Attention deficit hyperactivity disorder (ADHD) 09/28/2017  . Insomnia, unspecified 09/28/2017  . Plantar fasciitis of left foot 01/17/2017  . Left knee pain 09/01/2016  . Obesity 01/14/2016  . Rheumatoid arthritis (HCC) 01/14/2016  . Hypothyroidism due to acquired  atrophy of thyroid 01/14/2016  . Hormone replacement therapy 01/14/2016  . Osteopenia 09/15/2015  . Bipolar 1 disorder (HCC) 08/21/2015  . Gastroesophageal reflux disease without esophagitis 08/21/2015  . Migraine 08/21/2015    Past Medical History:  Diagnosis Date  . Bipolar 1 disorder (HCC)   . Chronic headache   . Depression   . Gallstone   . Hypertension   . IBS (irritable bowel syndrome)   . Obesity   . Thyroid disease   . Ulcer     Past Surgical History:  Procedure Laterality Date  . ABDOMINAL HYSTERECTOMY    . APPENDECTOMY    . CHOLECYSTECTOMY    . COLONOSCOPY  09/07/2018  . CYSTECTOMY     dermoid  . ESOPHAGOGASTRODUODENOSCOPY     about 20 years ago to check for ulcers  . MYOMECTOMY    . 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 46  . UPPER GASTROINTESTINAL ENDOSCOPY      Social History   Tobacco Use  . Smoking status: Former Smoker    Packs/day: 0.50    Years: 20.00    Pack years: 10.00    Quit date: 01/16/2014    Years since quitting: 6.0  . Smokeless tobacco: Never Used  Vaping Use  . Vaping Use: Every day  . Substances: Nicotine  Substance Use Topics  .  Alcohol use: Not Currently  . Drug use: No    Family History  Problem Relation Age of Onset  . Colon cancer Neg Hx   . Esophageal cancer Neg Hx   . Colon polyps Neg Hx   . Rectal cancer Neg Hx   . Stomach cancer Neg Hx     Allergies  Allergen Reactions  . Coconut Oil Anaphylaxis  . Codeine   . Sulfa Antibiotics Hives  . Trazodone And Nefazodone     Vivid dreams    Medication list has been reviewed and updated.  Current Outpatient Medications on File Prior to Visit  Medication Sig Dispense Refill  . ADDERALL XR 25 MG 24 hr capsule TAKE 1 CAPSULE BY MOUTH EACH MORNING 30 capsule 0  . ADDERALL XR 25 MG 24 hr capsule TAKE 1 CAPSULE BY MOUTH EACH MORNING 30 capsule 0  . ADDERALL XR 25 MG 24 hr capsule TAKE 1 CAPSULE BY MOUTH EVERY MORNING 30 capsule 0  . ADDERALL XR 25 MG 24 hr capsule TAKE 1  CAPSULE BY MOUTH EVERY MORNING 30 capsule 0  . alendronate (FOSAMAX) 70 MG tablet Take 70 mg by mouth.    . Alum & Mag Hydroxide-Simeth (GI COCKTAIL) SUSP suspension Take 30 mLs by mouth 2 (two) times daily. Shake well. (Patient not taking: Reported on 06/21/2019) 300 mL 0  . amitriptyline (ELAVIL) 50 MG tablet Take 1 tablet (50 mg total) by mouth every evening. 90 tablet 1  . amoxicillin-clavulanate (AUGMENTIN) 875-125 MG tablet Take 1 tablet by mouth 2 (two) times daily. 20 tablet 0  . amphetamine-dextroamphetamine (ADDERALL XR) 25 MG 24 hr capsule TAKE 1 CAPSULE BY MOUTH EVERY MORNING 30 capsule 0  . ARIPiprazole (ABILIFY) 15 MG tablet TAKE 1 TABLET BY MOUTH EACH DAY 90 tablet 1  . Ascorbic Acid (VITAMIN C) 100 MG tablet Take 100 mg by mouth daily.    . calcium citrate-vitamin D (CITRACAL+D) 315-200 MG-UNIT tablet Take 1 tablet by mouth 2 (two) times daily.    . cholecalciferol (VITAMIN D) 1000 UNITS tablet Take 1,000 Units by mouth daily.    Marland Kitchen Dexlansoprazole 30 MG capsule Take 1 capsule (30 mg total) by mouth daily. 30 capsule 3  . gabapentin (NEURONTIN) 300 MG capsule 1 po q am, 1 po at noon, 2 po about 3 hours before bed. (Patient taking differently: Take 600 mg by mouth at bedtime. ) 120 capsule 1  . Insulin Pen Needle (PEN NEEDLES) 31G X 6 MM MISC 1 each by Does not apply route daily. 50 each prn  . levothyroxine (SYNTHROID) 150 MCG tablet TAKE 1 TABLET BY MOUTH EVERY OTHER DAY (ALTERNATING WITH ) 90 tablet 3  . levothyroxine (SYNTHROID) 175 MCG tablet TAKE 1 TABLET BY MOUTH EVERY OTHER DAY ALTERNATING WITH TABS 30 tablet 0  . Liraglutide -Weight Management (SAXENDA) 18 MG/3ML SOPN Start with 0.6 mg subcu daily, increase by 0.6 mg weekly to a goal dose of 3 mg daily 15 mL 3  . lisinopril (ZESTRIL) 10 MG tablet Take 1 tablet (10 mg total) by mouth daily. 90 tablet 1  . omeprazole (PRILOSEC) 40 MG capsule TAKE 1 CAPSULE BY MOUTH 2 TIMES DAILY FOR ACID REFLUX (Patient not taking:  Reported on 10/29/2019) 180 capsule 3  . QUEtiapine (SEROQUEL) 50 MG tablet Take 3 tablets (150 mg total) by mouth at bedtime. 270 tablet 1  . rizatriptan (MAXALT-MLT) 10 MG disintegrating tablet TAKE 1 TABLET BY MOUTH AT ONSET OF HEADACHE. MAY REPEAT ONCE IN  2 HOURS IF NEEDED 12 tablet 2  . Specialty Vitamins Products (MAGNESIUM, AMINO ACID CHELATE,) 133 MG tablet Take 1 tablet by mouth 2 (two) times daily. (Patient not taking: Reported on 10/29/2019)    . Tofacitinib Citrate 5 MG TABS Take 1 tablet by mouth 2 (two) times daily.      No current facility-administered medications on file prior to visit.    Review of Systems:  As per HPI- otherwise negative.   Physical Examination: There were no vitals filed for this visit. There were no vitals filed for this visit. There is no height or weight on file to calculate BMI. Ideal Body Weight:    Pt observed via video monitor.  She looks well, no change in mentation or consciousness is noted.  No wheezing, shortness of breath noted  She is not checking vital signs at home  Assessment and Plan: Changes in consciousness - Plan: Ambulatory referral to Neurology  Virtual visit today to discuss possible change in consciousness, patient has noted what may be absence seizure's.  She has never had a sort of issue in the past  Discussed with patient in detail.  She is also noticing palpitations, otherwise no unusual symptoms.  I would not expect arrhythmia to cause an absence type appearance, we would more expect syncope or presyncope.  However, certainly we will do labs and EKG for further evaluation.  I have also placed an urgent neurology referral for patient, counseled her not to drive until the situation is resolved.  I have made patient an in person appointment to see me on Wednesday, assuming my Covid test is negative I will see her then.  If I am not able to see her in person we will have her come in anyway for labs and EKG  No charge for  today since we plan a follow-up visit later this week  Signed Abbe Amsterdam, MD

## 2020-01-28 NOTE — Progress Notes (Deleted)
Healthcare at [facility] [address] [place], [place] [address] [phone]  Date:  01/29/2020   Name:  Grace Butler   DOB:  Oct 27, 1970   MRN:  [id number]  PCP:  Pearline Cables, MD    Chief Complaint: No chief complaint on file.   History of Present Illness:  Grace Butler is a 50 y.o. very pleasant female patient who presents with the following:  Pt is here today for follow-up; we had a virtual visit earlier this week as follows: Today Grace Butler notes that she has had some unusual sensations recently-not syncope or presyncope, but a sensation of feeling like she is out of her body This has occurred for a week or so -perhaps 6 times total It may seem to last 10-15 secondly Others have noticed it occuring as well; for example, she was standing at the sink washing dishes when her partner noticed that she seemed to have zoned out of the conversation It may occur when she is sitting or standing  Never had this in the past  Never had this sort of issue as a child No falls or accidents  No HA, no CP or SOB She may notice some palpitations- this is also somewhat new The palp may last a few seconds  No headaches since she started on amitriptyline  She is not aware of any changes in her routine or medications that could be triggering this issue  Patient Active Problem List   Diagnosis Date Noted  . Attention deficit hyperactivity disorder (ADHD) 09/28/2017  . Insomnia, unspecified 09/28/2017  . Plantar fasciitis of left foot 01/17/2017  . Left knee pain 09/01/2016  . Obesity 01/14/2016  . Rheumatoid arthritis (HCC) 01/14/2016  . Hypothyroidism due to acquired atrophy of thyroid 01/14/2016  . Hormone replacement therapy 01/14/2016  . Osteopenia 09/15/2015  . Bipolar 1 disorder (HCC) 08/21/2015  . Gastroesophageal reflux disease without esophagitis 08/21/2015  . Migraine 08/21/2015    Past Medical History:  Diagnosis  Date  . Bipolar 1 disorder (HCC)   . Chronic headache   . Depression   . Gallstone   . Hypertension   . IBS (irritable bowel syndrome)   . Obesity   . Thyroid disease   . Ulcer     Past Surgical History:  Procedure Laterality Date  . ABDOMINAL HYSTERECTOMY    . APPENDECTOMY    . CHOLECYSTECTOMY    . COLONOSCOPY  09/07/2018  . CYSTECTOMY     dermoid  . ESOPHAGOGASTRODUODENOSCOPY     about 20 years ago to check for ulcers  . MYOMECTOMY    . 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 29  . UPPER GASTROINTESTINAL ENDOSCOPY      Social History   Tobacco Use  . Smoking status: Former Smoker    Packs/day: 0.50    Years: 20.00    Pack years: 10.00    Quit date: 01/16/2014    Years since quitting: 6.0  . Smokeless tobacco: Never Used  Vaping Use  . Vaping Use: Every day  . Substances: Nicotine  Substance Use Topics  . Alcohol use: Not Currently  . Drug use: No    Family History  Problem Relation Age of Onset  . Colon cancer Neg Hx   . Esophageal cancer Neg Hx   . Colon polyps Neg Hx   . Rectal cancer Neg Hx   . Stomach cancer Neg Hx  Allergies  Allergen Reactions  . Coconut Oil Anaphylaxis  . Codeine   . Sulfa Antibiotics Hives  . Trazodone And Nefazodone     Vivid dreams    Medication list has been reviewed and updated.  Current Outpatient Medications on File Prior to Visit  Medication Sig Dispense Refill  . ADDERALL XR 25 MG 24 hr capsule TAKE 1 CAPSULE BY MOUTH EACH MORNING 30 capsule 0  . ADDERALL XR 25 MG 24 hr capsule TAKE 1 CAPSULE BY MOUTH EACH MORNING 30 capsule 0  . ADDERALL XR 25 MG 24 hr capsule TAKE 1 CAPSULE BY MOUTH EVERY MORNING 30 capsule 0  . ADDERALL XR 25 MG 24 hr capsule TAKE 1 CAPSULE BY MOUTH EVERY MORNING 30 capsule 0  . alendronate (FOSAMAX) 70 MG tablet Take 70 mg by mouth.    . Alum & Mag Hydroxide-Simeth (GI COCKTAIL) SUSP suspension Take 30 mLs by mouth 2 (two) times daily. Shake well. (Patient not taking: Reported on 06/21/2019) 300 mL 0   . amitriptyline (ELAVIL) 50 MG tablet Take 1 tablet (50 mg total) by mouth every evening. 90 tablet 1  . amoxicillin-clavulanate (AUGMENTIN) 875-125 MG tablet Take 1 tablet by mouth 2 (two) times daily. 20 tablet 0  . amphetamine-dextroamphetamine (ADDERALL XR) 25 MG 24 hr capsule TAKE 1 CAPSULE BY MOUTH EVERY MORNING 30 capsule 0  . ARIPiprazole (ABILIFY) 15 MG tablet TAKE 1 TABLET BY MOUTH EACH DAY 90 tablet 1  . Ascorbic Acid (VITAMIN C) 100 MG tablet Take 100 mg by mouth daily.    . calcium citrate-vitamin D (CITRACAL+D) 315-200 MG-UNIT tablet Take 1 tablet by mouth 2 (two) times daily.    . cholecalciferol (VITAMIN D) 1000 UNITS tablet Take 1,000 Units by mouth daily.    Marland Kitchen Dexlansoprazole 30 MG capsule Take 1 capsule (30 mg total) by mouth daily. 30 capsule 3  . gabapentin (NEURONTIN) 300 MG capsule 1 po q am, 1 po at noon, 2 po about 3 hours before bed. (Patient taking differently: Take 600 mg by mouth at bedtime. ) 120 capsule 1  . Insulin Pen Needle (PEN NEEDLES) 31G X 6 MM MISC 1 each by Does not apply route daily. 50 each prn  . levothyroxine (SYNTHROID) 150 MCG tablet TAKE 1 TABLET BY MOUTH EVERY OTHER DAY (ALTERNATING WITH ) 90 tablet 3  . levothyroxine (SYNTHROID) 175 MCG tablet TAKE 1 TABLET BY MOUTH EVERY OTHER DAY ALTERNATING WITH TABS 30 tablet 0  . Liraglutide -Weight Management (SAXENDA) 18 MG/3ML SOPN Start with 0.6 mg subcu daily, increase by 0.6 mg weekly to a goal dose of 3 mg daily 15 mL 3  . lisinopril (ZESTRIL) 10 MG tablet Take 1 tablet (10 mg total) by mouth daily. 90 tablet 1  . omeprazole (PRILOSEC) 40 MG capsule TAKE 1 CAPSULE BY MOUTH 2 TIMES DAILY FOR ACID REFLUX (Patient not taking: Reported on 10/29/2019) 180 capsule 3  . QUEtiapine (SEROQUEL) 50 MG tablet Take 3 tablets (150 mg total) by mouth at bedtime. 270 tablet 1  . rizatriptan (MAXALT-MLT) 10 MG disintegrating tablet TAKE 1 TABLET BY MOUTH AT ONSET OF HEADACHE. MAY REPEAT ONCE IN 2 HOURS IF  NEEDED 12 tablet 2  . Specialty Vitamins Products (MAGNESIUM, AMINO ACID CHELATE,) 133 MG tablet Take 1 tablet by mouth 2 (two) times daily. (Patient not taking: Reported on 10/29/2019)    . Tofacitinib Citrate 5 MG TABS Take 1 tablet by mouth 2 (two) times daily.      No  current facility-administered medications on file prior to visit.    Review of Systems:  As per HPI- otherwise negative.   Physical Examination: There were no vitals filed for this visit. There were no vitals filed for this visit. There is no height or weight on file to calculate BMI. Ideal Body Weight:    GEN: no acute distress. HEENT: Atraumatic, Normocephalic.  Ears and Nose: No external deformity. CV: RRR, No M/G/R. No JVD. No thrill. No extra heart sounds. PULM: CTA B, no wheezes, crackles, rhonchi. No retractions. No resp. distress. No accessory muscle use. ABD: S, NT, ND, +BS. No rebound. No HSM. EXTR: No c/c/e PSYCH: Normally interactive. Conversant.    Assessment and Plan: *** This visit occurred during the SARS-CoV-2 public health emergency.  Safety protocols were in place, including screening questions prior to the visit, additional usage of staff PPE, and extensive cleaning of exam room while observing appropriate contact time as indicated for disinfecting solutions.    Signed Abbe Amsterdam, MD

## 2020-01-28 NOTE — Patient Instructions (Incomplete)
Good to see you again today- I will be in touch with your results asap  

## 2020-01-29 ENCOUNTER — Ambulatory Visit: Payer: BC Managed Care – PPO | Admitting: Family Medicine

## 2020-01-29 DIAGNOSIS — R404 Transient alteration of awareness: Secondary | ICD-10-CM

## 2020-01-30 ENCOUNTER — Other Ambulatory Visit: Payer: Self-pay

## 2020-01-30 ENCOUNTER — Ambulatory Visit (INDEPENDENT_AMBULATORY_CARE_PROVIDER_SITE_OTHER): Payer: BC Managed Care – PPO

## 2020-01-30 ENCOUNTER — Other Ambulatory Visit: Payer: Self-pay | Admitting: Family Medicine

## 2020-01-30 ENCOUNTER — Encounter: Payer: Self-pay | Admitting: Family Medicine

## 2020-01-30 ENCOUNTER — Ambulatory Visit: Payer: BC Managed Care – PPO | Admitting: Medical

## 2020-01-30 ENCOUNTER — Other Ambulatory Visit (INDEPENDENT_AMBULATORY_CARE_PROVIDER_SITE_OTHER): Payer: BC Managed Care – PPO

## 2020-01-30 DIAGNOSIS — R4189 Other symptoms and signs involving cognitive functions and awareness: Secondary | ICD-10-CM

## 2020-01-30 LAB — COMPREHENSIVE METABOLIC PANEL
ALT: 31 U/L (ref 0–35)
AST: 27 U/L (ref 0–37)
Albumin: 4.2 g/dL (ref 3.5–5.2)
Alkaline Phosphatase: 73 U/L (ref 39–117)
BUN: 10 mg/dL (ref 6–23)
CO2: 31 mEq/L (ref 19–32)
Calcium: 10 mg/dL (ref 8.4–10.5)
Chloride: 104 mEq/L (ref 96–112)
Creatinine, Ser: 0.88 mg/dL (ref 0.40–1.20)
GFR: 77.71 mL/min (ref >60.00)
Glucose, Bld: 80 mg/dL (ref 70–99)
Potassium: 4.2 mEq/L (ref 3.5–5.1)
Sodium: 141 mEq/L (ref 135–145)
Total Bilirubin: 0.4 mg/dL (ref 0.2–1.2)
Total Protein: 7.1 g/dL (ref 6.0–8.3)

## 2020-01-30 LAB — CBC
HCT: 41.1 % (ref 36.0–46.0)
Hemoglobin: 13.5 g/dL (ref 12.0–15.0)
MCHC: 32.9 g/dL (ref 30.0–36.0)
MCV: 95.2 fl (ref 78.0–100.0)
Platelets: 264 10*3/uL (ref 150.0–400.0)
RBC: 4.32 Mil/uL (ref 3.87–5.11)
RDW: 13.4 % (ref 11.5–15.5)
WBC: 5.6 10*3/uL (ref 4.0–10.5)

## 2020-01-30 LAB — TSH: TSH: 10.44 u[IU]/mL — ABNORMAL HIGH (ref 0.35–4.50)

## 2020-02-03 DIAGNOSIS — Z6841 Body Mass Index (BMI) 40.0 and over, adult: Secondary | ICD-10-CM | POA: Diagnosis not present

## 2020-02-03 DIAGNOSIS — M0600 Rheumatoid arthritis without rheumatoid factor, unspecified site: Secondary | ICD-10-CM | POA: Diagnosis not present

## 2020-02-03 DIAGNOSIS — H04123 Dry eye syndrome of bilateral lacrimal glands: Secondary | ICD-10-CM | POA: Diagnosis not present

## 2020-02-03 DIAGNOSIS — M8589 Other specified disorders of bone density and structure, multiple sites: Secondary | ICD-10-CM | POA: Diagnosis not present

## 2020-02-03 DIAGNOSIS — E038 Other specified hypothyroidism: Secondary | ICD-10-CM | POA: Diagnosis not present

## 2020-02-03 DIAGNOSIS — E669 Obesity, unspecified: Secondary | ICD-10-CM | POA: Diagnosis not present

## 2020-02-03 DIAGNOSIS — Z79899 Other long term (current) drug therapy: Secondary | ICD-10-CM | POA: Diagnosis not present

## 2020-02-04 ENCOUNTER — Encounter: Payer: Self-pay | Admitting: Family Medicine

## 2020-02-06 ENCOUNTER — Ambulatory Visit: Payer: BC Managed Care – PPO | Admitting: Family Medicine

## 2020-02-07 DIAGNOSIS — M0689 Other specified rheumatoid arthritis, multiple sites: Secondary | ICD-10-CM | POA: Diagnosis not present

## 2020-02-07 DIAGNOSIS — Z79899 Other long term (current) drug therapy: Secondary | ICD-10-CM | POA: Diagnosis not present

## 2020-02-10 ENCOUNTER — Other Ambulatory Visit: Payer: Self-pay | Admitting: Physician Assistant

## 2020-02-10 NOTE — Telephone Encounter (Signed)
Next appt 04/09/20. Requesting refill on Seroquel 50 mg called to [facility], Phone # is [phone].

## 2020-02-11 NOTE — Telephone Encounter (Signed)
Pt called 3 days about refill for Seroquel @ [facility]. Message never routed to provider. Please send asap

## 2020-02-11 NOTE — Telephone Encounter (Signed)
Patient called following up on this refill. She is totally out of Seroquel.

## 2020-02-17 ENCOUNTER — Encounter: Payer: Self-pay | Admitting: Family Medicine

## 2020-02-19 NOTE — Progress Notes (Deleted)
[facility] [address], [address] [place], [place] [address] [phone]  Date:  02/20/2020   Name:  Grace Butler   DOB:  01/31/1970   MRN:  [id number]  PCP:  Pearline Cables, MD    Chief Complaint: No chief complaint on file.   History of Present Illness:  Grace Butler is a 50 y.o. very pleasant female patient who presents with the following:  Virtual visit today for concern of illness Patient location is home, provider location is office.  Patient identity confirmed with 2 factors, she gives consent for virtual visit today.  The patient and myself are present on the call  I last saw Grace Butler about 4 weeks ago when she was having some "out of body" sensations-possibly indicative of an absence seizure. We had her come in for EKG and lab work-her TSH was high, I adjusted her levothyroxine She want to see what this did for her prior to further evaluation with me, neurology appointment is pending  Patient Active Problem List   Diagnosis Date Noted  . Attention deficit hyperactivity disorder (ADHD) 09/28/2017  . Insomnia, unspecified 09/28/2017  . Plantar fasciitis of left foot 01/17/2017  . Left knee pain 09/01/2016  . Obesity 01/14/2016  . Rheumatoid arthritis (HCC) 01/14/2016  . Hypothyroidism due to acquired atrophy of thyroid 01/14/2016  . Hormone replacement therapy 01/14/2016  . Osteopenia 09/15/2015  . Bipolar 1 disorder (HCC) 08/21/2015  . Gastroesophageal reflux disease without esophagitis 08/21/2015  . Migraine 08/21/2015    Past Medical History:  Diagnosis Date  . Bipolar 1 disorder (HCC)   . Chronic headache   . Depression   . Gallstone   . Hypertension   . IBS (irritable bowel syndrome)   . Obesity   . Thyroid disease   . Ulcer     Past Surgical History:  Procedure Laterality Date  . ABDOMINAL HYSTERECTOMY    . APPENDECTOMY    . CHOLECYSTECTOMY    . COLONOSCOPY  09/07/2018  . CYSTECTOMY      dermoid  . ESOPHAGOGASTRODUODENOSCOPY     about 20 years ago to check for ulcers  . MYOMECTOMY    . 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 41  . UPPER GASTROINTESTINAL ENDOSCOPY      Social History   Tobacco Use  . Smoking status: Former Smoker    Packs/day: 0.50    Years: 20.00    Pack years: 10.00    Quit date: 01/16/2014    Years since quitting: 6.0  . Smokeless tobacco: Never Used  Vaping Use  . Vaping Use: Every day  . Substances: Nicotine  Substance Use Topics  . Alcohol use: Not Currently  . Drug use: No    Family History  Problem Relation Age of Onset  . Colon cancer Neg Hx   . Esophageal cancer Neg Hx   . Colon polyps Neg Hx   . Rectal cancer Neg Hx   . Stomach cancer Neg Hx     Allergies  Allergen Reactions  . Coconut Oil Anaphylaxis  . Codeine   . Sulfa Antibiotics Hives  . Trazodone And Nefazodone     Vivid dreams    Medication list has been reviewed and updated.  Current Outpatient Medications on File Prior to Visit  Medication Sig Dispense Refill  . ADDERALL XR 25 MG 24 hr capsule TAKE 1 CAPSULE BY MOUTH EACH MORNING 30 capsule 0  . ADDERALL  XR 25 MG 24 hr capsule TAKE 1 CAPSULE BY MOUTH EACH MORNING 30 capsule 0  . ADDERALL XR 25 MG 24 hr capsule TAKE 1 CAPSULE BY MOUTH EVERY MORNING 30 capsule 0  . ADDERALL XR 25 MG 24 hr capsule TAKE 1 CAPSULE BY MOUTH EVERY MORNING 30 capsule 0  . alendronate (FOSAMAX) 70 MG tablet Take 70 mg by mouth.    . Alum & Mag Hydroxide-Simeth (GI COCKTAIL) SUSP suspension Take 30 mLs by mouth 2 (two) times daily. Shake well. (Patient not taking: Reported on 06/21/2019) 300 mL 0  . amitriptyline (ELAVIL) 50 MG tablet Take 1 tablet (50 mg total) by mouth every evening. 90 tablet 1  . amoxicillin-clavulanate (AUGMENTIN) 875-125 MG tablet Take 1 tablet by mouth 2 (two) times daily. 20 tablet 0  . amphetamine-dextroamphetamine (ADDERALL XR) 25 MG 24 hr capsule TAKE 1 CAPSULE BY MOUTH EVERY MORNING 30 capsule 0  . ARIPiprazole  (ABILIFY) 15 MG tablet TAKE 1 TABLET BY MOUTH EACH DAY 90 tablet 1  . Ascorbic Acid (VITAMIN C) 100 MG tablet Take 100 mg by mouth daily.    . calcium citrate-vitamin D (CITRACAL+D) 315-200 MG-UNIT tablet Take 1 tablet by mouth 2 (two) times daily.    . cholecalciferol (VITAMIN D) 1000 UNITS tablet Take 1,000 Units by mouth daily.    Marland Kitchen Dexlansoprazole 30 MG capsule Take 1 capsule (30 mg total) by mouth daily. 30 capsule 3  . gabapentin (NEURONTIN) 300 MG capsule 1 po q am, 1 po at noon, 2 po about 3 hours before bed. (Patient taking differently: Take 600 mg by mouth at bedtime. ) 120 capsule 1  . Insulin Pen Needle (PEN NEEDLES) 31G X 6 MM MISC 1 each by Does not apply route daily. 50 each prn  . levothyroxine (SYNTHROID) 150 MCG tablet TAKE 1 TABLET BY MOUTH EVERY OTHER DAY (ALTERNATING WITH ) 90 tablet 3  . levothyroxine (SYNTHROID) 175 MCG tablet TAKE 1 TABLET BY MOUTH EVERY OTHER DAY ALTERNATING WITH TABS 30 tablet 0  . Liraglutide -Weight Management (SAXENDA) 18 MG/3ML SOPN Start with 0.6 mg subcu daily, increase by 0.6 mg weekly to a goal dose of 3 mg daily 15 mL 3  . lisinopril (ZESTRIL) 10 MG tablet Take 1 tablet (10 mg total) by mouth daily. 90 tablet 1  . omeprazole (PRILOSEC) 40 MG capsule TAKE 1 CAPSULE BY MOUTH 2 TIMES DAILY FOR ACID REFLUX (Patient not taking: Reported on 10/29/2019) 180 capsule 3  . QUEtiapine (SEROQUEL) 50 MG tablet TAKE 3 TABLETS BY MOUTH AT BEDTIME 270 tablet 0  . rizatriptan (MAXALT-MLT) 10 MG disintegrating tablet TAKE 1 TABLET BY MOUTH AT ONSET OF HEADACHE. MAY REPEAT ONCE IN 2 HOURS IF NEEDED 12 tablet 2  . Specialty Vitamins Products (MAGNESIUM, AMINO ACID CHELATE,) 133 MG tablet Take 1 tablet by mouth 2 (two) times daily. (Patient not taking: Reported on 10/29/2019)    . Tofacitinib Citrate 5 MG TABS Take 1 tablet by mouth 2 (two) times daily.      No current facility-administered medications on file prior to visit.    Review of Systems:  As  per HPI- otherwise negative.   Physical Examination: There were no vitals filed for this visit. There were no vitals filed for this visit. There is no height or weight on file to calculate BMI. Ideal Body Weight:    ***  Assessment and Plan: ***  Signed Abbe Amsterdam, MD

## 2020-02-20 ENCOUNTER — Telehealth: Payer: BC Managed Care – PPO | Admitting: Family Medicine

## 2020-02-20 ENCOUNTER — Other Ambulatory Visit: Payer: Self-pay

## 2020-02-20 DIAGNOSIS — Z91199 Patient's noncompliance with other medical treatment and regimen due to unspecified reason: Secondary | ICD-10-CM

## 2020-02-20 DIAGNOSIS — Z5329 Procedure and treatment not carried out because of patient's decision for other reasons: Secondary | ICD-10-CM

## 2020-02-20 NOTE — Progress Notes (Signed)
No-show for virtual visit. We called patient several times and left a message

## 2020-02-22 ENCOUNTER — Other Ambulatory Visit: Payer: Self-pay | Admitting: Physician Assistant

## 2020-02-24 ENCOUNTER — Other Ambulatory Visit: Payer: Self-pay | Admitting: Physician Assistant

## 2020-03-04 ENCOUNTER — Other Ambulatory Visit: Payer: Self-pay | Admitting: Family Medicine

## 2020-03-04 DIAGNOSIS — E034 Atrophy of thyroid (acquired): Secondary | ICD-10-CM

## 2020-03-13 ENCOUNTER — Encounter: Payer: Self-pay | Admitting: Family Medicine

## 2020-03-13 DIAGNOSIS — E034 Atrophy of thyroid (acquired): Secondary | ICD-10-CM

## 2020-03-13 MED ORDER — LEVOTHYROXINE SODIUM 175 MCG PO TABS
175.0000 ug | ORAL_TABLET | Freq: Every day | ORAL | 3 refills | Status: DC
Start: 2020-03-13 — End: 2020-06-09

## 2020-03-24 ENCOUNTER — Telehealth: Payer: Self-pay | Admitting: Physician Assistant

## 2020-03-24 ENCOUNTER — Other Ambulatory Visit: Payer: Self-pay | Admitting: Physician Assistant

## 2020-03-24 MED ORDER — ADDERALL XR 25 MG PO CP24: ORAL_CAPSULE | ORAL | 0 refills | Status: DC

## 2020-03-24 NOTE — Telephone Encounter (Signed)
Prescription was sent

## 2020-03-24 NOTE — Telephone Encounter (Signed)
Grace Butler called to request refill of her Adderall.  Has appt 04/09/20.  Send to [facility] in [place], [place]

## 2020-03-25 ENCOUNTER — Other Ambulatory Visit: Payer: Self-pay | Admitting: Physician Assistant

## 2020-03-25 ENCOUNTER — Other Ambulatory Visit: Payer: Self-pay | Admitting: Family Medicine

## 2020-04-09 ENCOUNTER — Encounter: Payer: Self-pay | Admitting: Physician Assistant

## 2020-04-09 ENCOUNTER — Telehealth (INDEPENDENT_AMBULATORY_CARE_PROVIDER_SITE_OTHER): Payer: BC Managed Care – PPO | Admitting: Physician Assistant

## 2020-04-09 DIAGNOSIS — G2581 Restless legs syndrome: Secondary | ICD-10-CM

## 2020-04-09 DIAGNOSIS — F319 Bipolar disorder, unspecified: Secondary | ICD-10-CM

## 2020-04-09 DIAGNOSIS — G4700 Insomnia, unspecified: Secondary | ICD-10-CM | POA: Diagnosis not present

## 2020-04-09 DIAGNOSIS — F902 Attention-deficit hyperactivity disorder, combined type: Secondary | ICD-10-CM

## 2020-04-09 DIAGNOSIS — F411 Generalized anxiety disorder: Secondary | ICD-10-CM

## 2020-04-09 MED ORDER — AMPHETAMINE-DEXTROAMPHET ER 30 MG PO CP24: 30.0000 mg | ORAL_CAPSULE | Freq: Every day | ORAL | 0 refills | Status: DC

## 2020-04-09 NOTE — Progress Notes (Signed)
[facility] Check  Patient ID: Grace Butler,  MRN: [id number]  PCP: Pearline Cables, MD  Date of Evaluation: 04/09/2020 Time spent:25 minutes  Chief Complaint:  Chief Complaint    ADD; Depression; Follow-up     Virtual Visit via Telehealth  I connected with patient by telephone, with their informed consent, and verified patient privacy and that I am speaking with the correct person using two identifiers.  I am private, in my office and the patient is at work.  I discussed the limitations, risks, security and privacy concerns of performing an evaluation and management service by telephone and the availability of in person appointments. I also discussed with the patient that there may be a patient responsible charge related to this service. The patient expressed understanding and agreed to proceed.  Attempt was made for video call.  We were able to see each other but not here each other.  Had to resort to phone visit.   I discussed the assessment and treatment plan with the patient. The patient was provided an opportunity to ask questions and all were answered. The patient agreed with the plan and demonstrated an understanding of the instructions.   The patient was advised to call back or seek an in-person evaluation if the symptoms worsen or if the condition fails to improve as anticipated.  I provided 25 minutes of non-face-to-face time during this encounter.   HISTORY/CURRENT STATUS: HPI for 66-month med check.  Patient states she is doing really well with her medications except for not being able to focus as well lately.  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  Things got a little worse in the past several months.  No known reason.  Does get distracted easily.  Patient denies loss of interest in usual activities and is able to enjoy things.  Denies decreased energy or motivation.  Appetite has not  changed.  No extreme sadness, tearfulness, or feelings of hopelessness.  Sleeps well most of the time.  Denies suicidal or homicidal thoughts.  Patient denies increased energy with decreased need for sleep, no increased talkativeness, no racing thoughts, no impulsivity or risky behaviors, no increased spending, no increased libido, no grandiosity, no increased irritability or anger, and no hallucinations.  Not having a lot of anxiety at all, only when she feels like the Adderall is not working and she needs to stay on task better.  Denies dizziness, syncope, seizures, numbness, tingling, tremor, tics, unsteady gait, slurred speech, confusion. Denies muscle or joint pain, stiffness, or dystonia.  Individual Medical History/ Review of Systems: Changes? :No   Allergies: Coconut oil, Codeine, Sulfa antibiotics, and Trazodone and nefazodone  Current Medications:  Current Outpatient Medications:  .  alendronate (FOSAMAX) 70 MG tablet, Take 70 mg by mouth., Disp: , Rfl:  .  amitriptyline (ELAVIL) 50 MG tablet, Take 1 tablet (50 mg total) by mouth every evening., Disp: 90 tablet, Rfl: 1 .  [START ON 04/24/2020] amphetamine-dextroamphetamine (ADDERALL XR) 30 MG 24 hr capsule, Take 1 capsule (30 mg total) by mouth daily with breakfast., Disp: 30 capsule, Rfl: 0 .  ARIPiprazole (ABILIFY) 15 MG tablet, TAKE 1 TABLET BY MOUTH EACH DAY, Disp: 90 tablet, Rfl: 1 .  Ascorbic Acid (VITAMIN C) 100 MG tablet, Take 100 mg by mouth daily., Disp: , Rfl:  .  calcium citrate-vitamin D (CITRACAL+D) 315-200 MG-UNIT tablet, Take 1 tablet by mouth 2 (two) times  daily., Disp: , Rfl:  .  cholecalciferol (VITAMIN D) 1000 UNITS tablet, Take 1,000 Units by mouth daily., Disp: , Rfl:  .  Dexlansoprazole 30 MG capsule, Take 1 capsule (30 mg total) by mouth daily., Disp: 30 capsule, Rfl: 3 .  gabapentin (NEURONTIN) 300 MG capsule, TAKE 1 CAPSULE BY MOUTH EVERY MORNING, 1CAPSULE AT NOON, AND 2 CAPSULES ABOUT THREE HOURS PRIOR TO  BEDTIME, Disp: 120 capsule, Rfl: 1 .  Insulin Pen Needle (PEN NEEDLES) 31G X 6 MM MISC, 1 each by Does not apply route daily., Disp: 50 each, Rfl: prn .  levothyroxine (SYNTHROID) 175 MCG tablet, Take 1 tablet (175 mcg total) by mouth daily before breakfast., Disp: 30 tablet, Rfl: 3 .  Liraglutide -Weight Management (SAXENDA) 18 MG/3ML SOPN, Start with 0.6 mg subcu daily, increase by 0.6 mg weekly to a goal dose of 3 mg daily, Disp: 15 mL, Rfl: 3 .  lisinopril (ZESTRIL) 10 MG tablet, Take 1 tablet (10 mg total) by mouth daily., Disp: 90 tablet, Rfl: 1 .  omeprazole (PRILOSEC) 40 MG capsule, Take 1 capsule (40 mg total) by mouth 2 (two) times daily as needed., Disp: 180 capsule, Rfl: 3 .  QUEtiapine (SEROQUEL) 50 MG tablet, TAKE 3 TABLETS BY MOUTH AT BEDTIME, Disp: 270 tablet, Rfl: 0 .  rizatriptan (MAXALT-MLT) 10 MG disintegrating tablet, TAKE 1 TABLET BY MOUTH AT ONSET OF HEADACHE. MAY REPEAT ONCE IN 2 HOURS IF NEEDED, Disp: 12 tablet, Rfl: 2 .  Tofacitinib Citrate 5 MG TABS, Take 1 tablet by mouth 2 (two) times daily. , Disp: , Rfl:  .  Alum & Mag Hydroxide-Simeth (GI COCKTAIL) SUSP suspension, Take 30 mLs by mouth 2 (two) times daily. Shake well. (Patient not taking: No sig reported), Disp: 300 mL, Rfl: 0 .  amoxicillin-clavulanate (AUGMENTIN) 875-125 MG tablet, Take 1 tablet by mouth 2 (two) times daily. (Patient not taking: Reported on 04/09/2020), Disp: 20 tablet, Rfl: 0 .  Specialty Vitamins Products (MAGNESIUM, AMINO ACID CHELATE,) 133 MG tablet, Take 1 tablet by mouth 2 (two) times daily. (Patient not taking: No sig reported), Disp: , Rfl:  Medication Side Effects: none  Family Medical/ Social History: Changes?  New job    MENTAL HEALTH EXAM:  There were no vitals taken for this visit.There is no height or weight on file to calculate BMI.  General Appearance: Unable to assess  Eye Contact:  Unable to assess  Speech:  Clear and Coherent and Normal Rate  Volume:  Normal  Mood:  Euthymic   Affect:  Unable to assess  Thought Process:  Goal Directed and Descriptions of Associations: Intact  Orientation:  Full (Time, Place, and Person)  Thought Content: Logical   Suicidal Thoughts:  No  Homicidal Thoughts:  No  Memory:  WNL  Judgement:  Good  Insight:  Good  Psychomotor Activity:  Unable to assess  Concentration:  Concentration: Fair and Attention Span: Fair  Recall:  Good  Fund of Knowledge: Good  Language: Good  Assets:  Desire for Improvement  ADL's:  Intact  Cognition: WNL  Prognosis:  Good   Labs 01/30/2020 Glucose was 80.  Other CMP values normal as well. Uncertain when last lipid panel was drawn.  DIAGNOSES:    ICD-10-CM   1. Attention deficit hyperactivity disorder (ADHD), combined type  F90.2   2. Bipolar I disorder (HCC)  F31.9   3. Restless leg syndrome  G25.81   4. Insomnia, unspecified type  G47.00   5. Generalized anxiety disorder  F41.1     Receiving Psychotherapy: No    RECOMMENDATIONS:  PDMP was reviewed. I provided 25 minutes of nonface-to-face time during this encounter, including time spent in records review. She is doing well for the most part but the ADHD is not as well-controlled as it had been.  Increase Adderall XR to 30 mg p.o. every morning.  She should call in approximately 3 weeks after being on it to let me know how it is working.  If working well then I will send in 3 more prescriptions.  If not she will let me know her symptoms and I will treat appropriately. Continue amitriptyline 50 mg, 1 p.o. nightly. Continue Abilify 15 mg, 1 p.o. every morning. Continue gabapentin 300 mg 1 p.o. every morning, 1 around noon and 2 p.o. approximately 3 hours total bedtime. Continue Seroquel 50 mg, 3 p.o. nightly, for sleep. Need to order lipid panel at next visit if she has not had drawn through PCP yet. Return in 6 months.   Melony Overly, PA-C

## 2020-04-17 ENCOUNTER — Other Ambulatory Visit: Payer: Self-pay | Admitting: Physician Assistant

## 2020-04-17 DIAGNOSIS — G43909 Migraine, unspecified, not intractable, without status migrainosus: Secondary | ICD-10-CM

## 2020-04-28 ENCOUNTER — Other Ambulatory Visit: Payer: Self-pay | Admitting: Family Medicine

## 2020-04-28 DIAGNOSIS — I10 Essential (primary) hypertension: Secondary | ICD-10-CM

## 2020-05-01 ENCOUNTER — Telehealth: Payer: Self-pay

## 2020-05-01 NOTE — Telephone Encounter (Signed)
Prior Authorization submitted for AMPHETAMINE-DEXTROAMPHETAMINE XR 30 MG with [email or web address] ID# [id number]  [phone]

## 2020-05-19 ENCOUNTER — Telehealth: Payer: Self-pay | Admitting: Physician Assistant

## 2020-05-19 ENCOUNTER — Other Ambulatory Visit: Payer: Self-pay | Admitting: Physician Assistant

## 2020-05-19 MED ORDER — AMPHETAMINE-DEXTROAMPHET ER 30 MG PO CP24: 30.0000 mg | ORAL_CAPSULE | Freq: Every day | ORAL | 0 refills | Status: DC

## 2020-05-19 NOTE — Telephone Encounter (Signed)
Please review

## 2020-05-19 NOTE — Telephone Encounter (Signed)
3 separate prescriptions were sent.

## 2020-05-19 NOTE — Telephone Encounter (Signed)
Pt called and said that the adderall 30 mg xr is working and she needs a refill sent to [facility]

## 2020-05-21 ENCOUNTER — Other Ambulatory Visit: Payer: Self-pay | Admitting: Physician Assistant

## 2020-05-22 ENCOUNTER — Encounter: Payer: Self-pay | Admitting: Family Medicine

## 2020-05-26 NOTE — Telephone Encounter (Signed)
Patient Seroquel has not been refilled per pharmacy stating need a appointment . Patient was just seen 4/7. Rx to be filled at [facility].

## 2020-06-05 NOTE — Progress Notes (Addendum)
[facility] [address], [address] [place], [place] [address] [phone]  Date:  06/08/2020   Name:  Grace Butler   DOB:  December 19, 1970   MRN:  [id number]  PCP:  Pearline Cables, MD    Chief Complaint: HRT (Concerns/questions; Check labs- pt feels frustrated and is getting some depressed about her weight. She has been to [facility] bariatrics but she says she was told that the provider told her she was not a good candidate d/t her reflux issues. )   History of Present Illness:  Grace Butler is a 50 y.o. very pleasant female patient who presents with the following:  Pt here today with concern of obesity, she is frustrated by difficulty with weight loss and notes that she continues to gain She had a consult for bariatric surgery, but was told that she was likely not an ideal candidate due to her reflux. History of RA, mood disorder, ADD, hypothyroisidm  Last visit with myself in January of this year   Lab Results  Component Value Date   TSH 10.44 (H) 01/30/2020   Need to recheck TSH today - she is on 175 mcg now   Wt Readings from Last 3 Encounters:  06/08/20 (!) 319 lb 3.2 oz (144.8 kg)  08/09/19 (!) 305 lb 12.8 oz (138.7 kg)  05/31/19 (!) 310 lb (140.6 kg)   We used saxenda in the past but it was made her GERD worse  She still has her right ovary - She had a hysterectomy at age 66 due to fibroids.  She has therefore not had periods in decades, we are not sure if she might be menopausal Her left ovary was removed at age 41 due to a dermoid cyst  Patient Active Problem List   Diagnosis Date Noted  . Attention deficit hyperactivity disorder (ADHD) 09/28/2017  . Insomnia, unspecified 09/28/2017  . Plantar fasciitis of left foot 01/17/2017  . Left knee pain 09/01/2016  . Obesity 01/14/2016  . Rheumatoid arthritis (HCC) 01/14/2016  . Hypothyroidism due to acquired atrophy of thyroid 01/14/2016  . Hormone replacement therapy  01/14/2016  . Osteopenia 09/15/2015  . Bipolar 1 disorder (HCC) 08/21/2015  . Gastroesophageal reflux disease without esophagitis 08/21/2015  . Migraine 08/21/2015    Past Medical History:  Diagnosis Date  . Bipolar 1 disorder (HCC)   . Chronic headache   . Depression   . Gallstone   . Hypertension   . IBS (irritable bowel syndrome)   . Obesity   . Thyroid disease   . Ulcer     Past Surgical History:  Procedure Laterality Date  . ABDOMINAL HYSTERECTOMY    . APPENDECTOMY    . CHOLECYSTECTOMY    . COLONOSCOPY  09/07/2018  . CYSTECTOMY     dermoid  . ESOPHAGOGASTRODUODENOSCOPY     about 20 years ago to check for ulcers  . MYOMECTOMY    . 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 84  . UPPER GASTROINTESTINAL ENDOSCOPY      Social History   Tobacco Use  . Smoking status: Former Smoker    Packs/day: 0.50    Years: 20.00    Pack years: 10.00    Quit date: 01/16/2014    Years since quitting: 6.3  . Smokeless tobacco: Never Used  Vaping Use  . Vaping Use: Every day  . Substances: Nicotine  Substance Use Topics  . Alcohol use: Not Currently  . Drug  use: No    Family History  Problem Relation Age of Onset  . Colon cancer Neg Hx   . Esophageal cancer Neg Hx   . Colon polyps Neg Hx   . Rectal cancer Neg Hx   . Stomach cancer Neg Hx     Allergies  Allergen Reactions  . Coconut Oil Anaphylaxis  . Codeine   . Sulfa Antibiotics Hives  . Trazodone And Nefazodone     Vivid dreams    Medication list has been reviewed and updated.  Current Outpatient Medications on File Prior to Visit  Medication Sig Dispense Refill  . alendronate (FOSAMAX) 70 MG tablet Take 70 mg by mouth.    . Alum & Mag Hydroxide-Simeth (GI COCKTAIL) SUSP suspension Take 30 mLs by mouth 2 (two) times daily. Shake well. 300 mL 0  . amitriptyline (ELAVIL) 50 MG tablet TAKE 1 TABLET BY MOUTH EVERY EVENING 90 tablet 1  . [START ON 07/24/2020] amphetamine-dextroamphetamine (ADDERALL XR) 30 MG 24 hr capsule Take  1 capsule (30 mg total) by mouth daily with breakfast. 30 capsule 0  . [START ON 06/25/2020] amphetamine-dextroamphetamine (ADDERALL XR) 30 MG 24 hr capsule Take 1 capsule (30 mg total) by mouth daily. 30 capsule 0  . amphetamine-dextroamphetamine (ADDERALL XR) 30 MG 24 hr capsule Take 1 capsule (30 mg total) by mouth daily. 30 capsule 0  . ARIPiprazole (ABILIFY) 15 MG tablet TAKE 1 TABLET BY MOUTH EACH DAY 90 tablet 1  . Ascorbic Acid (VITAMIN C) 100 MG tablet Take 100 mg by mouth daily.    . calcium citrate-vitamin D (CITRACAL+D) 315-200 MG-UNIT tablet Take 1 tablet by mouth 2 (two) times daily.    . cholecalciferol (VITAMIN D) 1000 UNITS tablet Take 1,000 Units by mouth daily.    Marland Kitchen Dexlansoprazole 30 MG capsule Take 1 capsule (30 mg total) by mouth daily. 30 capsule 3  . gabapentin (NEURONTIN) 300 MG capsule TAKE 1 CAPSULE BY MOUTH EVERY MORNING, 1CAPSULE AT NOON, AND 2 CAPSULES ABOUT THREE HOURS PRIOR TO BEDTIME 120 capsule 1  . Insulin Pen Needle (PEN NEEDLES) 31G X 6 MM MISC 1 each by Does not apply route daily. 50 each prn  . levothyroxine (SYNTHROID) 175 MCG tablet Take 1 tablet (175 mcg total) by mouth daily before breakfast. 30 tablet 3  . lisinopril (ZESTRIL) 10 MG tablet TAKE 1 TABLET BY MOUTH EVERY DAY 90 tablet 1  . omeprazole (PRILOSEC) 40 MG capsule Take 1 capsule (40 mg total) by mouth 2 (two) times daily as needed. 180 capsule 3  . QUEtiapine (SEROQUEL) 50 MG tablet TAKE 3 TABLETS BY MOUTH AT BEDTIME 270 tablet 0  . rizatriptan (MAXALT-MLT) 10 MG disintegrating tablet TAKE 1 TABLET BY MOUTH AT ONSET OF HEADACHE. MAY REPEAT ONCE IN 2 HOURS IF NEEDED 12 tablet 2  . Specialty Vitamins Products (MAGNESIUM, AMINO ACID CHELATE,) 133 MG tablet Take 1 tablet by mouth 2 (two) times daily.    . Tofacitinib Citrate 5 MG TABS Take 1 tablet by mouth 2 (two) times daily.      No current facility-administered medications on file prior to visit.    Review of Systems:  As per HPI- otherwise  negative.   Physical Examination: Vitals:   06/08/20 1553  BP: 124/60  Pulse: 91  Resp: 18  SpO2: 98%   Vitals:   06/08/20 1553  Weight: (!) 319 lb 3.2 oz (144.8 kg)   Body mass index is 48.53 kg/m. Ideal Body Weight:    GEN: no  acute distress.  Obese, otherwise looks well HEENT: Atraumatic, Normocephalic.  Ears and Nose: No external deformity. CV: RRR, No M/G/R. No JVD. No thrill. No extra heart sounds. PULM: CTA B, no wheezes, crackles, rhonchi. No retractions. No resp. distress. No accessory muscle use. EXTR: No c/c/e PSYCH: Normally interactive. Conversant.    Assessment and Plan: Hypothyroidism due to acquired atrophy of thyroid - Plan: TSH  Menopause - Plan: FSH  Class 3 severe obesity with body mass index (BMI) of 45.0 to 49.9 in adult, unspecified obesity type, unspecified whether serious comorbidity present [facility])   Patient today with concern of obesity.  She had hoped to undergo bariatric surgery, but may not be an ideal candidate.  We will check her thyroid today and make any adjustments that may be necessary We will also check FSH to determine if she is in menopause If her thyroid appears recently well controlled, we might experiment with a different GLP-1-could try [facility] and see if she can tolerate this better than she did [place] Will plan further follow- up pending labs.  This visit occurred during the SARS-CoV-2 public health emergency.  Safety protocols were in place, including screening questions prior to the visit, additional usage of staff PPE, and extensive cleaning of exam room while observing appropriate contact time as indicated for disinfecting solutions.    Signed Abbe Amsterdam, MD  Received her labs 6/7- message to pt  Results for orders placed or performed in visit on 06/08/20  TSH  Result Value Ref Range   TSH 4.76 (H) 0.35 - 4.50 uIU/mL  FSH  Result Value Ref Range   FSH 59.8 mIU/ML   Will increase her synthroid from 175 to 200  mcg. FSH shows that she is menopausal We will offered to start her on Wegovy

## 2020-06-08 ENCOUNTER — Other Ambulatory Visit: Payer: Self-pay

## 2020-06-08 ENCOUNTER — Ambulatory Visit: Payer: BC Managed Care – PPO | Admitting: Family Medicine

## 2020-06-08 ENCOUNTER — Encounter: Payer: Self-pay | Admitting: Family Medicine

## 2020-06-08 VITALS — BP 124/60 | HR 91 | Resp 18 | Wt 319.2 lb

## 2020-06-08 DIAGNOSIS — Z6841 Body Mass Index (BMI) 40.0 and over, adult: Secondary | ICD-10-CM | POA: Diagnosis not present

## 2020-06-08 DIAGNOSIS — Z780 Asymptomatic menopausal state: Secondary | ICD-10-CM

## 2020-06-08 DIAGNOSIS — E034 Atrophy of thyroid (acquired): Secondary | ICD-10-CM | POA: Diagnosis not present

## 2020-06-08 NOTE — Patient Instructions (Signed)
It was good to see you today- I will be in touch with your labs asap We will adjust your thyroid if need be FSH is pending to see if you have much ovarian function If your thyroid is reaonable I would favor trying [facility] for you for weight loss I will be in touch asap

## 2020-06-09 ENCOUNTER — Encounter: Payer: Self-pay | Admitting: Family Medicine

## 2020-06-09 DIAGNOSIS — M069 Rheumatoid arthritis, unspecified: Secondary | ICD-10-CM

## 2020-06-09 LAB — FOLLICLE STIMULATING HORMONE: FSH: 59.8 m[IU]/mL

## 2020-06-09 LAB — TSH: TSH: 4.76 u[IU]/mL — ABNORMAL HIGH (ref 0.35–4.50)

## 2020-06-09 MED ORDER — LEVOTHYROXINE SODIUM 200 MCG PO TABS: 200.0000 ug | ORAL_TABLET | Freq: Every day | ORAL | 6 refills | Status: DC

## 2020-06-09 NOTE — Addendum Note (Signed)
Addended by: Abbe Amsterdam C on: 06/09/2020 01:31 PM   Modules accepted: Orders

## 2020-06-17 MED ORDER — WEGOVY 0.5 MG/0.5ML ~~LOC~~ SOAJ: 0.5000 mg | SUBCUTANEOUS | 0 refills | Status: DC

## 2020-06-17 MED ORDER — SEMAGLUTIDE-WEIGHT MANAGEMENT 0.25 MG/0.5ML ~~LOC~~ SOAJ: 0.2500 mg | SUBCUTANEOUS | 0 refills | Status: AC

## 2020-06-26 ENCOUNTER — Other Ambulatory Visit: Payer: Self-pay | Admitting: Physician Assistant

## 2020-07-02 ENCOUNTER — Telehealth: Payer: Self-pay

## 2020-07-02 ENCOUNTER — Encounter: Payer: Self-pay | Admitting: Family Medicine

## 2020-07-02 NOTE — Telephone Encounter (Signed)
Initiated PA via [facility]ove my meds for [employer].   CHY:[id number]  Waiting on determination.

## 2020-07-02 NOTE — Telephone Encounter (Signed)
Prior Authorization approved via cover my meds.   Approved 07/02/2020-12/17/2020. Patient and pharmacy made aware.

## 2020-07-13 ENCOUNTER — Telehealth: Payer: Self-pay | Admitting: *Deleted

## 2020-07-13 NOTE — Telephone Encounter (Signed)
Prior auth started via cover my meds.  Awaiting determination.  approved from 07/13/2020 through 07/13/2021.

## 2020-07-14 DIAGNOSIS — Z79899 Other long term (current) drug therapy: Secondary | ICD-10-CM | POA: Diagnosis not present

## 2020-07-14 DIAGNOSIS — M0600 Rheumatoid arthritis without rheumatoid factor, unspecified site: Secondary | ICD-10-CM | POA: Diagnosis not present

## 2020-07-14 DIAGNOSIS — R5383 Other fatigue: Secondary | ICD-10-CM | POA: Diagnosis not present

## 2020-07-15 NOTE — Telephone Encounter (Signed)
Medication did not need authorization.

## 2020-07-27 ENCOUNTER — Ambulatory Visit: Payer: BC Managed Care – PPO | Admitting: Internal Medicine

## 2020-07-29 ENCOUNTER — Encounter: Payer: Self-pay | Admitting: Internal Medicine

## 2020-07-29 ENCOUNTER — Ambulatory Visit: Payer: BC Managed Care – PPO | Admitting: Internal Medicine

## 2020-07-29 ENCOUNTER — Ambulatory Visit: Payer: Self-pay

## 2020-07-29 ENCOUNTER — Other Ambulatory Visit: Payer: Self-pay

## 2020-07-29 ENCOUNTER — Telehealth: Payer: Self-pay

## 2020-07-29 VITALS — BP 101/68 | HR 74 | Ht 67.0 in | Wt 321.0 lb

## 2020-07-29 DIAGNOSIS — G8929 Other chronic pain: Secondary | ICD-10-CM

## 2020-07-29 DIAGNOSIS — M0600 Rheumatoid arthritis without rheumatoid factor, unspecified site: Secondary | ICD-10-CM | POA: Diagnosis not present

## 2020-07-29 DIAGNOSIS — M25561 Pain in right knee: Secondary | ICD-10-CM | POA: Diagnosis not present

## 2020-07-29 DIAGNOSIS — K219 Gastro-esophageal reflux disease without esophagitis: Secondary | ICD-10-CM

## 2020-07-29 DIAGNOSIS — M25562 Pain in left knee: Secondary | ICD-10-CM

## 2020-07-29 DIAGNOSIS — Z79899 Other long term (current) drug therapy: Secondary | ICD-10-CM | POA: Diagnosis not present

## 2020-07-29 DIAGNOSIS — Z111 Encounter for screening for respiratory tuberculosis: Secondary | ICD-10-CM | POA: Diagnosis not present

## 2020-07-29 NOTE — Telephone Encounter (Signed)
Prior Authorization for AMPHETAMINE-DEXTROAMPHETAMINE XR 30 MG #30 completed over the phone, unable to identify electronically. Contacted [employer] [phone] received APPROVAL effective 07/29/2020-07/29/2021, REF # [id number]    ID# [id number]

## 2020-07-29 NOTE — Progress Notes (Signed)
Office Visit Note  Patient: Grace Butler             Date of Birth: 05-Dec-1970           MRN: [id number]             PCP: Darreld Mclean, MD Referring: Darreld Mclean, MD Visit Date: 07/29/2020 Occupation: Human resources work  Subjective:  New Patient (Initial Visit) (Patient complains of bilateral knee and foot pain and lower leg tenderness. Patient is currently on PLQ for RA and does not feel as if her symptoms are well controlled. Patient had PLQ eye exam within the last 3 months. Patient was previously taking MTX which affected LFTs, then Humira, Enbrel, and Xeljanz- with injectable medications patient had issues with administration and site reaction but overall felt the medication was controlling her symptoms. )   History of Present Illness: Grace Butler is a 50 y.o. female here for transition of care for seronegative rheumatoid arthritis previously managed with [facility] rheumatology.  Rheumatoid arthritis was diagnosed in 2013 with development of bilateral hand and feet swelling and morning stiffness.  Previous treatments include methotrexate stopped due to elevated transaminases.  She was treated with Humira for about 6 months but with increasing injection site reaction and pain did not tolerate this well.  She was switched to Enbrel continued this for several months but also stopped due to the same problem of local injection site reactions and pain.  She is not sure whether the medicine was ever effective due to early discontinuation.  She started [place] in 2016 she felt greatly improved her symptoms continue this medicine until 2021 she does question whether there was loss of efficacy.  After [name] she started hydroxychloroquine has been on this since October 2021 does not feel this medication has been highly effective for her joint pain.  Currently she complains of daily pain worst in her bilateral knees and feet.  She describes the pain as sharp but states her  foot pain feels more like a pressure sensation.  The knee pain is exacerbated climbing stairs.  She notices stiffness pain and redness over her MCP joints does not describe discrete swelling in this area lately.  She is not sure whether there is knee or ankle swelling related to her arthritis body habitus and swelling and redness she gets in the feet from standing during the day.  Currently she takes Celebrex once daily in the mornings and takes Tylenol usually twice a day for her ongoing joint pain.  She previously took oral diclofenac that she found helpful but was switched due to concern of long-term NSAID exposure.  She takes hydroxychloroquine 400 mg/day.  DMARD Hx MTX d/c LFTs Humira d/c injection site reactions Enbrel d/c injection site reactions [name] ?loss of efficacy Hydroxychloroquine current, incomplete response  Labs reviewed 07/2020 ESR 33 CBC wnl CMP wnl  10/2019 ANA neg  03/2019 Quantiferon neg  02/2014 HBV/HCV neg RF neg CCP neg Vit D 32  Imaging reviewed 10/2019 Xray bilateral hands and feet report No interval RA changes  2016 Xray cervical spine report Approximately 2 mm retrolisthesis of C5 on C6 in neutral positioning and extension, which reduces with flexion. DJD  2016 Xray bilateral knees report mild bilateral medial and patellofemoral compartment oa 2016 Xray bilateral hands and feet report no erosions, +periarticular osteopenia, calcaneal enthesophyte   Activities of Daily Living:  Patient reports morning stiffness for 24 hours.   Patient Denies nocturnal pain.  Difficulty dressing/grooming: Reports Difficulty climbing stairs: Reports Difficulty getting out of chair: Reports Difficulty using hands for taps, buttons, cutlery, and/or writing: Denies  Review of Systems  Constitutional:  Positive for fatigue.  HENT:  Negative for mouth sores, mouth dryness and nose dryness.   Eyes:  Negative for pain, itching, visual disturbance and dryness.   Respiratory:  Negative for cough, hemoptysis, shortness of breath and difficulty breathing.   Cardiovascular:  Negative for chest pain, palpitations and swelling in legs/feet.  Gastrointestinal:  Negative for abdominal pain, blood in stool, constipation and diarrhea.  Endocrine: Negative for increased urination.  Genitourinary:  Negative for painful urination.  Musculoskeletal:  Positive for joint pain, joint pain, myalgias, muscle weakness, morning stiffness, muscle tenderness and myalgias. Negative for joint swelling.  Skin:  Negative for color change, rash and redness.  Allergic/Immunologic: Negative for susceptible to infections.  Neurological:  Negative for dizziness, numbness, headaches, memory loss and weakness.  Hematological:  Negative for swollen glands.  Psychiatric/Behavioral:  Negative for confusion and sleep disturbance.    PMFS History:  Patient Active Problem List   Diagnosis Date Noted   High risk medication use 07/29/2020   Bilateral knee pain 07/29/2020   Attention deficit hyperactivity disorder (ADHD) 09/28/2017   Insomnia, unspecified 09/28/2017   Plantar fasciitis of left foot 01/17/2017   Left knee pain 09/01/2016   Obesity 01/14/2016   Rheumatoid arthritis ([place]) 01/14/2016   Hypothyroidism due to acquired atrophy of thyroid 01/14/2016   Hormone replacement therapy 01/14/2016   Osteopenia 09/15/2015   Bipolar 1 disorder ([place]) 08/21/2015   Gastroesophageal reflux disease without esophagitis 08/21/2015   Migraine 08/21/2015    Past Medical History:  Diagnosis Date   Bipolar 1 disorder (HCC)    Chronic headache    Depression    Gallstone    Hypertension    IBS (irritable bowel syndrome)    Obesity    Thyroid disease    Ulcer     Family History  Problem Relation Age of Onset   Hypertension Mother    Heart attack Father    Healthy Sister    Healthy Sister    Healthy Sister    Healthy Brother    Healthy Son    Rheum arthritis Paternal Uncle     Colon cancer Neg Hx    Esophageal cancer Neg Hx    Colon polyps Neg Hx    Rectal cancer Neg Hx    Stomach cancer Neg Hx    Past Surgical History:  Procedure Laterality Date   ABDOMINAL HYSTERECTOMY     APPENDECTOMY     CHOLECYSTECTOMY     COLONOSCOPY  09/07/2018   CYSTECTOMY     dermoid   ESOPHAGOGASTRODUODENOSCOPY     about 20 years ago to check for ulcers   KNEE ARTHROSCOPY Left 2019   MYOMECTOMY     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 76   UPPER GASTROINTESTINAL ENDOSCOPY     Social History   Social History Narrative   Not on file   Immunization History  Administered Date(s) Administered   Influenza,inj,Quad PF,6+ Mos 01/14/2016, 09/13/2017   Influenza-Unspecified 10/31/2016, 08/06/2018   Pneumococcal Conjugate-13 03/24/2014   Pneumococcal Polysaccharide-23 05/26/2014   Pneumococcal-Unspecified 05/26/2014   Tdap 11/10/2017     Objective: Vital Signs: BP 101/68 (BP Location: Right Arm, Patient Position: Sitting, Cuff Size: Large)   Pulse 74   Ht '5\' 7"'  (1.702 m)   Wt (!) 321 lb (145.6 kg)   BMI 50.28 kg/m  Physical Exam Constitutional:      Appearance: She is obese.  HENT:     Right Ear: External ear normal.     Left Ear: External ear normal.     Mouth/Throat:     Mouth: Mucous membranes are moist.     Pharynx: Oropharynx is clear.  Eyes:     Conjunctiva/sclera: Conjunctivae normal.  Cardiovascular:     Rate and Rhythm: Normal rate and regular rhythm.  Pulmonary:     Effort: Pulmonary effort is normal.     Breath sounds: Normal breath sounds.  Skin:    General: Skin is warm and dry.     Findings: No rash.  Neurological:     General: No focal deficit present.     Mental Status: She is alert.     Deep Tendon Reflexes: Reflexes normal.  Psychiatric:        Mood and Affect: Mood normal.     Musculoskeletal Exam:  Neck full ROM no tenderness Shoulders full ROM no tenderness or swelling Elbows full ROM no tenderness or swelling Wrists full ROM no  tenderness or swelling Fingers full ROM, right second third MCP tenderness to pressure with normal range of motion no palpable synovitis of either hand, mild erythematous skin discoloration distal to MCP joints on bilateral hands with no swelling Knees full ROM pain with full extension and bilateral joint line tenderness to palpation medial and lateral of both knees no palpable effusions, knees feel mildly warm relative to surrounding skin over anterior surface Ankles full ROM no tenderness or swelling MTPs no swelling or squeeze tenderness   CDAI Exam: CDAI Score: 11  Patient Global: 50 mm; Provider Global: 20 mm Swollen: 0 ; Tender: 4  Joint Exam 07/29/2020      Right  Left  MCP 2   Tender     MCP 3   Tender     Knee   Tender   Tender     Investigation: No additional findings.  Imaging: XR KNEE 3 VIEW LEFT  Result Date: 07/29/2020 X-ray left knee 3 views Lateral worse than medial femorotibial joint space narrowing.  There is increased sclerosis also lateral osteophyte formation on medial and lateral borders.  Patella positioning slightly laterally displaced though patellofemoral space is patent.  No large joint effusion is visible. Impression Moderate left knee osteoarthritis changes no particular inflammatory changes are seen  XR KNEE 3 VIEW RIGHT  Result Date: 07/29/2020 X-ray right knee 3 views Medial and lateral femorotibial joint spaces appear well-preserved.  No significant joint space narrowing increased subchondral sclerosis or osteophyte formation is seen.  Patellofemoral joint space is patent with normal positioning.  No large joint effusion seen. Impression No significant arthritis changes appreciated   Recent Labs: Lab Results  Component Value Date   WBC 5.6 01/30/2020   HGB 13.5 01/30/2020   PLT 264.0 01/30/2020   NA 141 01/30/2020   K 4.2 01/30/2020   CL 104 01/30/2020   CO2 31 01/30/2020   GLUCOSE 80 01/30/2020   BUN 10 01/30/2020   CREATININE 0.88  01/30/2020   BILITOT 0.4 01/30/2020   ALKPHOS 73 01/30/2020   AST 27 01/30/2020   ALT 31 01/30/2020   PROT 7.1 01/30/2020   ALBUMIN 4.2 01/30/2020   CALCIUM 10.0 01/30/2020   GFRAA >60 04/23/2019    Speciality Comments: No specialty comments available.  Procedures:  No procedures performed Allergies: Coconut oil, Codeine, Sulfa antibiotics, and Trazodone and nefazodone   Assessment / Plan:  Visit Diagnoses: Rheumatoid arthritis with negative rheumatoid factor, involving unspecified site ([place]) - Plan: XR KNEE 3 VIEW RIGHT, XR KNEE 3 VIEW LEFT  She describes a lot of joint pain interfering with her mobility and activity there is not much obvious synovitis on exam there is some warmth and tenderness to the knees but do not appreciate large effusions.  We will continue hydroxychloroquine 400 mg daily at this time she plans to have ophthalmology records sent over.  Anticipate resuming biologic DMARD treatment would recommend trial of citrate free etanercept formulation as her described injection site reaction sounds like it may be related to this carrier agent.   High risk medication use - Plan: HIV Antibody (routine testing w rflx), QuantiFERON-TB Gold Plus  Outside labs reviewed but will obtain HIV and tuberculosis screening anticipating restarting a biologic DMARD treatment.  Chronic pain of both knees - Plan: XR KNEE 3 VIEW RIGHT, XR KNEE 3 VIEW LEFT  We will check bilateral knee x-rays suspect osteoarthritis as a significant component of symptoms potentially.    Gastroesophageal reflux disease without esophagitis  For control of current pain complaints we will switch from the Celebrex back to diclofenac for oral NSAID at this time.  Counseled importance of taking with food monitoring for symptoms such as epigastric pain increased acid reflux nausea or appetite change.  Orders: Orders Placed This Encounter  Procedures   XR KNEE 3 VIEW RIGHT   XR KNEE 3 VIEW LEFT   HIV  Antibody (routine testing w rflx)   QuantiFERON-TB Gold Plus   No orders of the defined types were placed in this encounter.    Follow-Up Instructions: No follow-ups on file.   Collier Salina, MD  Note - This record has been created using [employer].  Chart creation errors have been sought, but may not always  have been located. Such creation errors do not reflect on  the standard of medical care.

## 2020-07-29 NOTE — Patient Instructions (Signed)
Etanercept Injection What is this medication? ETANERCEPT (et a NER sept) is used for the treatment of rheumatoid arthritis. The medicine is also used to treat psoriatic arthritis, ankylosing spondylitis,and psoriasis. This medicine may be used for other purposes; ask your health care provider orpharmacist if you have questions. COMMON BRAND NAME(S): Enbrel What should I tell my care team before I take this medication? They need to know if you have any of these conditions: bleeding disorder cancer diabetes granulomatosis with polyangiitis heart failure HIV or AIDs immune system problems infection such as tuberculosis (TB) or other bacterial, fungal or viral infections liver disease nervous system problems such as Guillain-Barre syndrome, multiple sclerosis or seizures recent or upcoming vaccine an unusual or allergic reaction to etanercept, other medicines, latex, rubber, food, dyes, or preservatives pregnant or trying to get pregnant breast-feeding How should I use this medication? The medicine is injected under the skin. You will be taught how to prepare and give it. Take it as directed on the prescription label. Keep taking it unlessyour health care provider tells you stop. This medicine comes with INSTRUCTIONS FOR USE. Ask your pharmacist for directions on how to use this medicine. Read the information carefully. Talk toyour pharmacist or health care provider if you have questions. If you use a pen, be sure to take off the outer needle cover before using thedose. It is important that you put your used needles and syringes in a special sharps container. Do not put them in a trash can. If you do not have a sharpscontainer, call your pharmacist or health care provider to get one. A special MedGuide will be given to you by the pharmacist with eachprescription and refill. Be sure to read this information carefully each time. Talk to your health care provider about the use of this medicine in  children. While it may be prescribed for children as young as 2 years of age for selectedconditions, precautions do apply. Overdosage: If you think you have taken too much of this medicine contact apoison control center or emergency room at once. NOTE: This medicine is only for you. Do not share this medicine with others. What if I miss a dose? If you miss a dose, take it as soon as you can. If it is almost time for yournext dose, take only that dose. Do not take double or extra doses. What may interact with this medication? Do not take this medicine with any of the following medications: biologic medicines such as adalimumab, certolizumab, golimumab, infliximab live vaccines rilonacept This medicine may also interact with the following medications: abatacept anakinra biologic medicines such as anifrolumab, baricitinib, belimumab, canakinumab, natalizumab, rituximab, sarilumab, tocilizumab, tofacitinib, upadacitinib, vedolizumab cyclophosphamide sulfasalazine This list may not describe all possible interactions. Give your health care provider a list of all the medicines, herbs, non-prescription drugs, or dietary supplements you use. Also tell them if you smoke, drink alcohol, or use illegaldrugs. Some items may interact with your medicine. What should I watch for while using this medication? Visit your health care provider for regular checks on your progress. Tell your health care provider if your symptoms do not start to get better or if they getworse. This medicine may increase your risk of getting an infection. Call your health care provider for advice if you get a fever, chills, sore throat, or other symptoms of a cold or flu. Do not treat yourself. Try to avoid being around people who are sick. If you have not had the measles or chickenpox vaccines, tell   your health care provider right away if you are around someone with theseviruses. You will be tested for tuberculosis (TB) before you start  this medicine. If your doctor prescribes any medicine for TB, you should start taking the TB medicine before starting this medicine. Make sure to finish the full course ofTB medicine. Avoid taking medicines that contain aspirin, acetaminophen, ibuprofen, naproxen, or ketoprofen unless instructed by your health care provider. Thesemedicines may hide fever. Talk to your health care provider about your risk of cancer. You may be more atrisk for certain types of cancer if you take this medicine. This medicine can decrease the response to a vaccine. If you need to get vaccinated, tell your health care provider if you have received this medicine. Extra booster doses may be needed. Talk to your health care provider to see ifa different vaccination schedule is needed. What side effects may I notice from receiving this medication? Side effects that you should report to your health care provider as soon aspossible: allergic reactions (skin rash, itching or hives; swelling of the face, lips, or tongue) changes in vision dizziness heart failure (trouble breathing; fast, irregular heartbeat; sudden weight gain; swelling of the ankles, feet, hands; unusually weak or tired) infection (fever, chills, cough, sore throat, pain or trouble passing urine) light-colored stools liver injury (dark yellow or brown urine; general ill feeling or flu-like symptoms; loss of appetite, right upper belly pain; unusually weak or tired, yellowing of the eyes or skin) low red blood cell counts (trouble breathing; feeling faint; lightheaded, falls; unusually weak or tired) pain, tingling, numbness in the hands or feet red, scaly patches or raised bumps on the skin seizures unusual bruising or bleeding weakness in the arms or legs Side effects that usually do not require medical attention (report to yourhealth care provider if they continue or are bothersome): headache nausea pain, redness, or irritation at site where  injected vomiting This list may not describe all possible side effects. Call your doctor for medical advice about side effects. You may report side effects to FDA at1-800-FDA-1088. Where should I keep my medication? Keep out of the reach of children and pets. See product for storage information. Each product may have differentinstructions. Get rid of any unused medicine after the expiration date. To get rid of medicines that are no longer needed or have expired: Take the medicine to a medicine take-back program. Check with your pharmacy or law enforcement to find a location. If you cannot return the medicine, ask your pharmacist or health care provider how to get rid of this medicine safely. NOTE: This sheet is a summary. It may not cover all possible information. If you have questions about this medicine, talk to your doctor, pharmacist, orhealth care provider.  2022 Elsevier/Gold Standard (2019-10-04 12:59:29)  

## 2020-07-30 ENCOUNTER — Encounter: Payer: Self-pay | Admitting: Internal Medicine

## 2020-07-30 NOTE — Telephone Encounter (Signed)
Updated insurance.

## 2020-07-31 ENCOUNTER — Other Ambulatory Visit (HOSPITAL_COMMUNITY): Payer: Self-pay

## 2020-07-31 ENCOUNTER — Telehealth: Payer: Self-pay | Admitting: Pharmacist

## 2020-07-31 NOTE — Telephone Encounter (Signed)
Received notification from [facility] regarding a prior authorization for ENBREL. Authorization has been APPROVED from 07/31/20 to 07/31/21.   Patient must fill through [facility]: [phone]   Authorization # [id number]  Copay is $150 per 4 week supply. Patient eligible for copay card - enrolled into savings program. Patient will receive debit card in mail on 2-3 business days  Sent MyChart message to advise to expect debit card in her mail in the next 3-5 business days  Still awaiting TB gold to result  Chesley Mires, PharmD, MPH, BCPS Clinical Pharmacist (Rheumatology and Pulmonology)

## 2020-07-31 NOTE — Telephone Encounter (Signed)
Submitted a Prior Authorization request to [employer] for [employer] via [employer]. Will update once we receive a response.  Key: [id number]  Chesley Mires, PharmD, MPH, BCPS Clinical Pharmacist (Rheumatology and Pulmonology)

## 2020-07-31 NOTE — Telephone Encounter (Signed)
Please start Enbrel Sureclick auto-injector BIV.  Dose: 50mg  every 7 days  Dx: M06.00 (RA)  Previously tried therapies: MTX -caused elevated LFTs Humira - site reaction Enbrel - site reaction Xeljanz - waning clinical response   Current regimen: celecoxib, acetaminophen twice daily, hydroxychloroquine 200mg  twice daily  , PharmD, MPH, BCPS Clinical Pharmacist (Rheumatology and Pulmonology)

## 2020-07-31 NOTE — Telephone Encounter (Signed)
Enbrel BIV started. TB gold result still pending  Per OV note, plan is to resume Enbrel pending updated baseline immunosuppressive med labs  Chesley Mires, PharmD, MPH, BCPS Clinical Pharmacist (Rheumatology and Pulmonology)

## 2020-08-01 LAB — QUANTIFERON-TB GOLD PLUS
Mitogen-NIL: >10 IU/mL
NIL: 0.06 IU/mL
QuantiFERON-TB Gold Plus: NEGATIVE
TB1-NIL: <0 IU/mL
TB2-NIL: <0 IU/mL

## 2020-08-01 LAB — HIV ANTIBODY (ROUTINE TESTING W REFLEX): HIV 1&2 Ab, 4th Generation: NONREACTIVE

## 2020-08-04 ENCOUNTER — Other Ambulatory Visit (HOSPITAL_COMMUNITY): Payer: Self-pay

## 2020-08-04 ENCOUNTER — Other Ambulatory Visit: Payer: Self-pay

## 2020-08-04 DIAGNOSIS — M0600 Rheumatoid arthritis without rheumatoid factor, unspecified site: Secondary | ICD-10-CM

## 2020-08-04 MED ORDER — ENBREL SURECLICK 50 MG/ML ~~LOC~~ SOAJ
50.0000 mg | SUBCUTANEOUS | 2 refills | Status: DC
Start: 2020-08-04 — End: 2020-08-11
  Filled 2020-08-04 – 2020-08-05 (×2): qty 4, 28d supply, fill #0

## 2020-08-04 NOTE — Telephone Encounter (Signed)
Patient called again this afternoon because she spoke with [facility] and they told her Rx would be ready in 15 minutes, so she drove to [facility] and is upset medication was not ready. Explained to patient Rx has been sent and [facility] staff was made aware and are working on things on their end. Patient requested a call from pharmacy staff for an update.

## 2020-08-04 NOTE — Telephone Encounter (Signed)
Patient called stating she sent a MyChart message to [facility] to see if her Enbrel medication could be sent to [facility].  Patient states they were waiting on her TB test which was clear.  Patient states she would like to start the medication ASAP if possible.  Patient requested a return call.

## 2020-08-04 NOTE — Telephone Encounter (Signed)
[facility] is out of office until Thursday, if someone else is able to send the Rx in to the pharmacy then I can take it from there.

## 2020-08-05 ENCOUNTER — Other Ambulatory Visit (HOSPITAL_COMMUNITY): Payer: Self-pay

## 2020-08-05 NOTE — Telephone Encounter (Signed)
Rx is being coordinated with specialty pharmacy. Pt is allowed a one-time fill at [facility], and will need to obtain future refills from insurance preferred pharmacy. Will provide further updates in separate encounter.

## 2020-08-05 NOTE — Telephone Encounter (Signed)
Completed onboarding tasks in [place] and pushed Rx through in [facility]. Verified Rx was filled and reached out to patient. Verified that she had received copay/debit card in the mail. Patient stated she would pick up medication around 4pm today.  Will investigate as to preferred pharmacy going forward and coordinate with [facility] to prevent any interruption of therapy.

## 2020-08-06 ENCOUNTER — Emergency Department (HOSPITAL_BASED_OUTPATIENT_CLINIC_OR_DEPARTMENT_OTHER)
Admission: EM | Admit: 2020-08-06 | Discharge: 2020-08-06 | Disposition: A | Discharge status: Home / Self Care | Payer: BC Managed Care – PPO | Attending: Emergency Medicine | Admitting: Emergency Medicine

## 2020-08-06 ENCOUNTER — Encounter (HOSPITAL_BASED_OUTPATIENT_CLINIC_OR_DEPARTMENT_OTHER): Payer: Self-pay | Admitting: *Deleted

## 2020-08-06 ENCOUNTER — Emergency Department (HOSPITAL_BASED_OUTPATIENT_CLINIC_OR_DEPARTMENT_OTHER): Payer: BC Managed Care – PPO

## 2020-08-06 ENCOUNTER — Other Ambulatory Visit: Payer: Self-pay

## 2020-08-06 DIAGNOSIS — E039 Hypothyroidism, unspecified: Secondary | ICD-10-CM | POA: Diagnosis not present

## 2020-08-06 DIAGNOSIS — I10 Essential (primary) hypertension: Secondary | ICD-10-CM | POA: Insufficient documentation

## 2020-08-06 DIAGNOSIS — Z87891 Personal history of nicotine dependence: Secondary | ICD-10-CM | POA: Insufficient documentation

## 2020-08-06 DIAGNOSIS — R002 Palpitations: Secondary | ICD-10-CM | POA: Insufficient documentation

## 2020-08-06 DIAGNOSIS — Z79899 Other long term (current) drug therapy: Secondary | ICD-10-CM | POA: Insufficient documentation

## 2020-08-06 LAB — BASIC METABOLIC PANEL
Anion gap: 9 (ref 5–15)
BUN: 9 mg/dL (ref 6–20)
CO2: 27 mmol/L (ref 22–32)
Calcium: 9.7 mg/dL (ref 8.9–10.3)
Chloride: 103 mmol/L (ref 98–111)
Creatinine, Ser: 0.71 mg/dL (ref 0.44–1.00)
GFR, Estimated: >60 mL/min (ref >60)
Glucose, Bld: 99 mg/dL (ref 70–99)
Potassium: 4 mmol/L (ref 3.5–5.1)
Sodium: 139 mmol/L (ref 135–145)

## 2020-08-06 LAB — CBC WITH DIFFERENTIAL/PLATELET
Abs Immature Granulocytes: 0.01 10*3/uL (ref 0.00–0.07)
Basophils Absolute: 0 10*3/uL (ref 0.0–0.1)
Basophils Relative: 1 %
Eosinophils Absolute: 0.2 10*3/uL (ref 0.0–0.5)
Eosinophils Relative: 4 %
HCT: 42.8 % (ref 36.0–46.0)
Hemoglobin: 14.2 g/dL (ref 12.0–15.0)
Immature Granulocytes: 0 %
Lymphocytes Relative: 30 %
Lymphs Abs: 1.9 10*3/uL (ref 0.7–4.0)
MCH: 31.6 pg (ref 26.0–34.0)
MCHC: 33.2 g/dL (ref 30.0–36.0)
MCV: 95.3 fL (ref 80.0–100.0)
Monocytes Absolute: 0.4 10*3/uL (ref 0.1–1.0)
Monocytes Relative: 6 %
Neutro Abs: 3.8 10*3/uL (ref 1.7–7.7)
Neutrophils Relative %: 59 %
Platelets: 268 10*3/uL (ref 150–400)
RBC: 4.49 MIL/uL (ref 3.87–5.11)
RDW: 12.6 % (ref 11.5–15.5)
WBC: 6.4 10*3/uL (ref 4.0–10.5)
nRBC: 0 % (ref 0.0–0.2)

## 2020-08-06 LAB — TROPONIN I (HIGH SENSITIVITY)
Troponin I (High Sensitivity): 2 ng/L (ref <18)
Troponin I (High Sensitivity): 2 ng/L (ref <18)

## 2020-08-06 LAB — MAGNESIUM: Magnesium: 2.1 mg/dL (ref 1.7–2.4)

## 2020-08-06 LAB — TSH: TSH: 1.005 u[IU]/mL (ref 0.350–4.500)

## 2020-08-06 NOTE — Discharge Instructions (Addendum)
Your work-up here in the emergency department was reassuring.  This is likely a slight anxiety attack.  I would recommend rest.  I have written you a work note you may use as needed a return to work tomorrow.  Follow-up close with your primary care provider within the next 1 to 2 days return here for new or worsening symptoms.

## 2020-08-06 NOTE — ED Notes (Signed)
Blood drawn via 23 gauge butterfly from [facility]. Pt tolerated well

## 2020-08-06 NOTE — ED Notes (Signed)
Pt states stress at new job, states after meeting today left work, pt states started hyperventilating and tearful, states felt heart palpitations and pulled over.  Pt denies any history of anxiety or panic attacks.

## 2020-08-06 NOTE — ED Provider Notes (Signed)
[facility] EMERGENCY DEPARTMENT Provider Note   CSN: [id number] Arrival date & time: 08/06/20  1318     History Chief Complaint  Patient presents with   Palpitations    Grace Butler is a 50 y.o. female with past medical history significant for bipolar, depression, hypertension, thyroid disorder, RA who presents for evaluation of palpitations.  Patient recently started a new job which is stressful.  She had a work meeting today.  Patient states she became upset during a work meeting and left.  When she got in the car she was very tearful, hyperventilating, had heart palpitations.  Patient states she feels significantly better currently.  She was upset and crying when she was being triaged by the nurse.  No prior history of PE or DVT.  She does not get any exertional chest pain at baseline.  No pleuritic chest pain or shortness of breath no DOE.  No PND orthopnea.  No swelling to lower extremities.  No recent mobilizations, surgery, malignancy.  Denies additional aggravating or alleviating factors.  No prior history of panic attacks  History obtained from patient and past medical records.  No interpreter used  HPI     Past Medical History:  Diagnosis Date   Bipolar 1 disorder (HCC)    Chronic headache    Depression    Gallstone    Hypertension    IBS (irritable bowel syndrome)    Obesity    Thyroid disease    Ulcer     Patient Active Problem List   Diagnosis Date Noted   High risk medication use 07/29/2020   Bilateral knee pain 07/29/2020   Attention deficit hyperactivity disorder (ADHD) 09/28/2017   Insomnia, unspecified 09/28/2017   Plantar fasciitis of left foot 01/17/2017   Left knee pain 09/01/2016   Obesity 01/14/2016   Rheumatoid arthritis (HCC) 01/14/2016   Hypothyroidism due to acquired atrophy of thyroid 01/14/2016   Hormone replacement therapy 01/14/2016   Osteopenia 09/15/2015   Bipolar 1 disorder (HCC) 08/21/2015   Gastroesophageal reflux  disease without esophagitis 08/21/2015   Migraine 08/21/2015    Past Surgical History:  Procedure Laterality Date   ABDOMINAL HYSTERECTOMY     APPENDECTOMY     CHOLECYSTECTOMY     COLONOSCOPY  09/07/2018   CYSTECTOMY     dermoid   ESOPHAGOGASTRODUODENOSCOPY     about 20 years ago to check for ulcers   KNEE ARTHROSCOPY Left 2019   MYOMECTOMY     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 41   UPPER GASTROINTESTINAL ENDOSCOPY       OB History   No obstetric history on file.     Family History  Problem Relation Age of Onset   Hypertension Mother    Heart attack Father    Healthy Sister    Healthy Sister    Healthy Sister    Healthy Brother    Healthy Son    Rheum arthritis Paternal Uncle    Colon cancer Neg Hx    Esophageal cancer Neg Hx    Colon polyps Neg Hx    Rectal cancer Neg Hx    Stomach cancer Neg Hx     Social History   Tobacco Use   Smoking status: Former    Packs/day: 0.50    Years: 20.00    Pack years: 10.00    Types: Cigarettes    Quit date: 01/16/2014    Years since quitting: 6.5   Smokeless tobacco: Never  Vaping Use  Vaping Use: Some days   Substances: Nicotine  Substance Use Topics   Alcohol use: Yes    Comment: Rarely   Drug use: No    Home Medications Prior to Admission medications   Medication Sig Start Date End Date Taking? Authorizing Provider  alendronate (FOSAMAX) 70 MG tablet Take 70 mg by mouth. Patient not taking: Reported on 07/29/2020 08/31/16   [provider]  Alum & Mag Hydroxide-Simeth (GI COCKTAIL) SUSP suspension Take 30 mLs by mouth 2 (two) times daily. Shake well. Patient taking differently: Take 30 mLs by mouth as needed. Shake well. 05/13/19   Copland, Gwenlyn Found, MD  amitriptyline (ELAVIL) 50 MG tablet TAKE 1 TABLET BY MOUTH EVERY EVENING 04/20/20   Cherie Ouch, PA-C  amphetamine-dextroamphetamine (ADDERALL XR) 30 MG 24 hr capsule Take 1 capsule (30 mg total) by mouth daily with breakfast. 07/24/20   Cherie Ouch,  PA-C  amphetamine-dextroamphetamine (ADDERALL XR) 30 MG 24 hr capsule Take 1 capsule (30 mg total) by mouth daily. Patient not taking: Reported on 07/29/2020 06/25/20   Cherie Ouch, PA-C  amphetamine-dextroamphetamine (ADDERALL XR) 30 MG 24 hr capsule Take 1 capsule (30 mg total) by mouth daily. Patient not taking: Reported on 07/29/2020 05/26/20   Melony Overly T, PA-C  ARIPiprazole (ABILIFY) 15 MG tablet TAKE 1 TABLET BY MOUTH EACH DAY 06/30/20   Melony Overly T, PA-C  Ascorbic Acid (VITAMIN C) 100 MG tablet Take 100 mg by mouth daily.    [provider]  calcium citrate-vitamin D (CITRACAL+D) 315-200 MG-UNIT tablet Take 1 tablet by mouth 2 (two) times daily.    [provider]  celecoxib (CELEBREX) 100 MG capsule Take 100 mg by mouth 2 (two) times daily as needed. 06/18/20   [provider]  cholecalciferol (VITAMIN D) 1000 UNITS tablet Take 1,000 Units by mouth daily.    [provider]  Dexlansoprazole 30 MG capsule Take 1 capsule (30 mg total) by mouth daily. Patient not taking: Reported on 07/29/2020 09/03/19   Copland, Gwenlyn Found, MD  etanercept (ENBREL SURECLICK) 50 MG/ML injection Inject 50 mg into the skin once a week. 08/04/20   Rice, Jamesetta Orleans, MD  gabapentin (NEURONTIN) 300 MG capsule TAKE 1 CAPSULE BY MOUTH EVERY MORNING, 1CAPSULE AT NOON, AND 2 CAPSULES ABOUT THREE HOURS PRIOR TO BEDTIME Patient taking differently: 300 mg at bedtime. 06/30/20   Cherie Ouch, PA-C  hydroxychloroquine (PLAQUENIL) 200 MG tablet Take 200 mg by mouth 2 (two) times daily. 06/18/20   [provider]  Insulin Pen Needle (PEN NEEDLES) 31G X 6 MM MISC 1 each by Does not apply route daily. 07/29/19   Copland, Gwenlyn Found, MD  levothyroxine (SYNTHROID) 200 MCG tablet Take 1 tablet (200 mcg total) by mouth daily before breakfast. 06/09/20   Copland, Gwenlyn Found, MD  lisinopril (ZESTRIL) 10 MG tablet TAKE 1 TABLET BY MOUTH EVERY DAY 04/28/20   Copland, Gwenlyn Found, MD  omeprazole  (PRILOSEC) 40 MG capsule Take 1 capsule (40 mg total) by mouth 2 (two) times daily as needed. 03/25/20   Copland, Gwenlyn Found, MD  QUEtiapine (SEROQUEL) 50 MG tablet TAKE 3 TABLETS BY MOUTH AT BEDTIME 05/26/20   Hurst, Rosey Bath T, PA-C  rizatriptan (MAXALT-MLT) 10 MG disintegrating tablet TAKE 1 TABLET BY MOUTH AT ONSET OF HEADACHE. MAY REPEAT ONCE IN 2 HOURS IF NEEDED 09/27/19   Copland, Gwenlyn Found, MD  Semaglutide-Weight Management (WEGOVY) 0.5 MG/0.5ML SOAJ Inject 0.5 mg into the skin once a week. Patient  not taking: Reported on 07/29/2020 06/17/20   Copland, Gwenlyn Found, MD  Specialty Vitamins Products (MAGNESIUM, AMINO ACID CHELATE,) 133 MG tablet Take 1 tablet by mouth 2 (two) times daily.    [provider]    Allergies    Coconut oil, Codeine, Sulfa antibiotics, and Trazodone and nefazodone  Review of Systems   Review of Systems  Constitutional: Negative.   HENT: Negative.    Respiratory: Negative.    Cardiovascular:  Positive for palpitations. Negative for leg swelling.  Gastrointestinal: Negative.   Genitourinary: Negative.   Musculoskeletal: Negative.   Neurological: Negative.   All other systems reviewed and are negative.  Physical Exam Updated Vital Signs BP (!) 150/115 (BP Location: Right Arm)   Pulse 68   Temp 97.9 F (36.6 C) (Oral)   Resp 18   Ht 5\' 7"  (1.702 m)   Wt (!) 145.6 kg   SpO2 99%   BMI 50.27 kg/m   Physical Exam Vitals and nursing note reviewed.  Constitutional:      General: She is not in acute distress.    Appearance: She is well-developed. She is not ill-appearing, toxic-appearing or diaphoretic.  HENT:     Head: Normocephalic and atraumatic.     Nose: Nose normal.     Mouth/Throat:     Mouth: Mucous membranes are moist.  Eyes:     Pupils: Pupils are equal, round, and reactive to light.  Cardiovascular:     Rate and Rhythm: Normal rate.     Pulses: Normal pulses.          Radial pulses are 2+ on the right side and 2+ on the left side.        Dorsalis pedis pulses are 2+ on the right side and 2+ on the left side.     Heart sounds: Normal heart sounds.  Pulmonary:     Effort: Pulmonary effort is normal. No respiratory distress.     Breath sounds: Normal breath sounds and air entry.     Comments: Clear bilaterally.  Speaks in full sentences without difficulty Chest:     Comments: Equal rise and fall to chest with Abdominal:     General: Bowel sounds are normal. There is no distension.     Tenderness: There is no abdominal tenderness.     Comments: Abdomen soft, nontender  Musculoskeletal:        General: No swelling, tenderness, deformity or signs of injury. Normal range of motion.     Cervical back: Normal range of motion.     Right lower leg: No edema.     Left lower leg: No edema.     Comments: No bony tenderness.  Compartments soft.  Moves all 4 extremities without difficulty  Skin:    General: Skin is warm and dry.     Capillary Refill: Capillary refill takes less than 2 seconds.     Comments: No obvious rash or lesions  Neurological:     General: No focal deficit present.     Mental Status: She is alert.     Comments: Ambulatory without ataxic gait  Psychiatric:        Mood and Affect: Mood normal.    ED Results / Procedures / Treatments   Labs (all labs ordered are listed, but only abnormal results are displayed) Labs Reviewed  CBC WITH DIFFERENTIAL/PLATELET  BASIC METABOLIC PANEL  MAGNESIUM  TSH  TROPONIN I (HIGH SENSITIVITY)  TROPONIN I (HIGH SENSITIVITY)    EKG EKG Interpretation  Date/Time:  Thursday August 06 2020 13:30:31 EDT Ventricular Rate:  74 PR Interval:  154 QRS Duration: 92 QT Interval:  422 QTC Calculation: 468 R Axis:   3 Text Interpretation: Normal sinus rhythm Normal ECG Confirmed by Alona Bene [phone]) on 08/06/2020 5:17:14 PM  Radiology DG Chest 2 View  Result Date: 08/06/2020 CLINICAL DATA:  Palpitations. EXAM: CHEST - 2 VIEW COMPARISON:  None. FINDINGS: The heart size  and mediastinal contours are within normal limits. Aortic atherosclerotic calcification noted. Both lungs are clear. Mild thoracic dextroscoliosis noted. IMPRESSION: No active cardiopulmonary disease. Electronically Signed   By: Danae Orleans M.D.   On: 08/06/2020 15:06    Procedures Procedures   Medications Ordered in ED Medications - No data to display  ED Course  I have reviewed the triage vital signs and the nursing notes.  Pertinent labs & imaging results that were available during my care of the patient were reviewed by me and considered in my medical decision making (see chart for details).  50 year old here for evaluation.  Afebrile, nonseptic, not ill-appearing.  Apparently got upset while at work.  Became very tearful, stated hyperventilating had some heart palpitations.  Upon arrival here to emergency department she was tearful and anxious.  She currently has no palpitations, chest pain, shortness of breath.  No upper respiratory complaints.  She is without tachycardia, tachypnea or hypoxia.  She is PERC negative, Wells criteria low risk.  No clinical evidence of DVT on exam.  Heart and lungs clear.  Abdomen soft, nontender.  She does get tearful when talking about her situation with work.  She has no exertional chest pain at baseline or any dyspnea on exertion.  No prior history of PE or DVT, recent surgery, immobilization or malignancy.  Labs and Imaging personally reviewed and interpreted:  CBC without leukocytosis Metabolic panel without electrolyte, renal abnormality Magnesium 2.1 Delta troponin flat, negative TSH send out test in process  Patient requesting discharge home.  Delta troponin pending.  She continues to be asymptomatic  Patient reassessed.  Labs and imaging unremarkable.  Suspect symptoms likely related to anxiety.  Low suspicion for acute ACS, PE, dissection, infectious process.  No arrhythmia or tachycardia while here in the emergency department over 4 hours  discussed rest at home, return for new or worsening symptoms and close follow-up with primary care provider.  The patient has been appropriately medically screened and/or stabilized in the ED. I have low suspicion for any other emergent medical condition which would require further screening, evaluation or treatment in the ED or require inpatient management.  Patient is hemodynamically stable and in no acute distress.  Patient able to ambulate in department prior to ED.  Evaluation does not show acute pathology that would require ongoing or additional emergent interventions while in the emergency department or further inpatient treatment.  I have discussed the diagnosis with the patient and answered all questions.  Pain is been managed while in the emergency department and patient has no further complaints prior to discharge.  Patient is comfortable with plan discussed in room and is stable for discharge at this time.  I have discussed strict return precautions for returning to the emergency department.  Patient was encouraged to follow-up with PCP/specialist refer to at discharge.      MDM Rules/Calculators/A&P                            Final Clinical Impression(s) / ED Diagnoses  Final diagnoses:  Palpitations    Rx / DC Orders ED Discharge Orders     None        Ely Spragg A, PA-C 08/06/20 Foye Clock, MD 08/07/20 1529

## 2020-08-06 NOTE — ED Triage Notes (Addendum)
States she had a meeting with her boss. Afterward she was upset and felt like she was going to pass out. She is crying and upset while at triage. EKG done for palpitations.

## 2020-08-07 ENCOUNTER — Other Ambulatory Visit: Payer: Self-pay | Admitting: Family Medicine

## 2020-08-07 DIAGNOSIS — I10 Essential (primary) hypertension: Secondary | ICD-10-CM

## 2020-08-11 ENCOUNTER — Other Ambulatory Visit: Payer: Self-pay | Admitting: Pharmacist

## 2020-08-11 ENCOUNTER — Other Ambulatory Visit (HOSPITAL_COMMUNITY): Payer: Self-pay

## 2020-08-11 DIAGNOSIS — M0600 Rheumatoid arthritis without rheumatoid factor, unspecified site: Secondary | ICD-10-CM

## 2020-08-11 MED ORDER — ENBREL SURECLICK 50 MG/ML ~~LOC~~ SOAJ: 50.0000 mg | SUBCUTANEOUS | 1 refills | Status: DC

## 2020-08-11 NOTE — Telephone Encounter (Signed)
Patient must fill Enbrel at [facility]. Rx sent [facility] for remaining fills. Notified [facility] that patient will be deactivated from their pharmacy.  Chesley Mires, PharmD, MPH, BCPS Clinical Pharmacist (Rheumatology and Pulmonology)

## 2020-08-11 NOTE — Telephone Encounter (Signed)
Patient must fill Enbrel at [facility]. Rx sent [facility] for remaining fills. Notified [facility] that patient will be deactivated from their pharmacy. Sent MyChart message to patient to advise and provide pharmacy phone number.  Chesley Mires, PharmD, MPH, BCPS Clinical Pharmacist (Rheumatology and Pulmonology)

## 2020-08-13 ENCOUNTER — Encounter: Payer: Self-pay | Admitting: Internal Medicine

## 2020-08-17 ENCOUNTER — Telehealth: Payer: Self-pay | Admitting: Physician Assistant

## 2020-08-17 NOTE — Telephone Encounter (Signed)
Pt requesting refills for Adderall, Abilify, Seroquel & Amitriplyline @ [facility]. Apt 10/5

## 2020-08-18 ENCOUNTER — Other Ambulatory Visit: Payer: Self-pay | Admitting: Physician Assistant

## 2020-08-18 ENCOUNTER — Other Ambulatory Visit: Payer: Self-pay

## 2020-08-18 DIAGNOSIS — G43909 Migraine, unspecified, not intractable, without status migrainosus: Secondary | ICD-10-CM

## 2020-08-18 MED ORDER — AMITRIPTYLINE HCL 50 MG PO TABS: 50.0000 mg | ORAL_TABLET | Freq: Every evening | ORAL | 0 refills | Status: DC

## 2020-08-18 MED ORDER — QUETIAPINE FUMARATE 50 MG PO TABS: 150.0000 mg | ORAL_TABLET | Freq: Every day | ORAL | 0 refills | Status: DC

## 2020-08-18 NOTE — Telephone Encounter (Signed)
Last refill 7/23 for Adderall. Will pend with appropriate date.

## 2020-08-18 NOTE — Telephone Encounter (Signed)
Abilify should already be on file at [facility]  Rx for Amitriptyline and Seroquel submitted for 90 day refill.

## 2020-08-20 NOTE — Telephone Encounter (Signed)
Last filled 7/22

## 2020-08-21 ENCOUNTER — Encounter: Payer: Self-pay | Admitting: Family Medicine

## 2020-08-21 ENCOUNTER — Ambulatory Visit: Payer: BC Managed Care – PPO | Admitting: Internal Medicine

## 2020-08-21 DIAGNOSIS — M25561 Pain in right knee: Secondary | ICD-10-CM

## 2020-08-21 NOTE — Addendum Note (Signed)
Addended by: Steve Rattler A on: 08/21/2020 11:24 AM   Modules accepted: Orders

## 2020-08-28 DIAGNOSIS — M25561 Pain in right knee: Secondary | ICD-10-CM | POA: Diagnosis not present

## 2020-08-31 ENCOUNTER — Encounter: Payer: Self-pay | Admitting: Internal Medicine

## 2020-08-31 ENCOUNTER — Other Ambulatory Visit: Payer: Self-pay | Admitting: Radiology

## 2020-08-31 DIAGNOSIS — M0600 Rheumatoid arthritis without rheumatoid factor, unspecified site: Secondary | ICD-10-CM

## 2020-08-31 MED ORDER — HYDROXYCHLOROQUINE SULFATE 200 MG PO TABS: 200.0000 mg | ORAL_TABLET | Freq: Two times a day (BID) | ORAL | 2 refills | Status: DC

## 2020-08-31 MED ORDER — DICLOFENAC SODIUM 25 MG PO TBEC: 25.0000 mg | DELAYED_RELEASE_TABLET | Freq: Two times a day (BID) | ORAL | 2 refills | Status: DC | PRN

## 2020-08-31 NOTE — Telephone Encounter (Signed)
Next Visit: Nothing scheduled Last Visit: 07/29/2020  Last Fill: 06/18/2020  DX: Rheumatoid arthritis with negative rheumatoid factor, involving unspecified site  Current Dose per office note 07/29/2020: continue hydroxychloroquine 400 mg daily   Labs: 08/06/2020 CBC, BMP  Okay to refill PLQ and Diclofenac?  *Patient also asked for Diclofenac 25 mg twice daily, I do not see this medication on patient's list though.

## 2020-08-31 NOTE — Telephone Encounter (Signed)
And she needs to discontinue celebrex when on diclofenac-it was documented on medication list not sure if accurate.

## 2020-08-31 NOTE — Telephone Encounter (Signed)
Okay to refill medications, but she also needs a clinic follow up since restarting the Enbrel recently ideally sometime in September.

## 2020-09-08 ENCOUNTER — Other Ambulatory Visit (HOSPITAL_COMMUNITY): Payer: Self-pay

## 2020-09-18 DIAGNOSIS — Z79899 Other long term (current) drug therapy: Secondary | ICD-10-CM | POA: Diagnosis not present

## 2020-09-21 ENCOUNTER — Other Ambulatory Visit: Payer: Self-pay | Admitting: Physician Assistant

## 2020-09-21 NOTE — Telephone Encounter (Signed)
Pt called requesting Adderall XR 30 mg 1/d @ [facility]. Apt 10/5

## 2020-09-22 NOTE — Telephone Encounter (Signed)
Last filled 8/18

## 2020-09-29 ENCOUNTER — Other Ambulatory Visit: Payer: Self-pay | Admitting: Internal Medicine

## 2020-09-29 DIAGNOSIS — M0600 Rheumatoid arthritis without rheumatoid factor, unspecified site: Secondary | ICD-10-CM

## 2020-09-29 NOTE — Telephone Encounter (Signed)
Refills okay, patient needs a follow up appointment scheduled around 2nd half October/beginning of november

## 2020-09-29 NOTE — Telephone Encounter (Signed)
[facility] for patient to call and schedule follow-up appointment in October.

## 2020-10-07 ENCOUNTER — Ambulatory Visit (INDEPENDENT_AMBULATORY_CARE_PROVIDER_SITE_OTHER): Payer: BC Managed Care – PPO | Admitting: Physician Assistant

## 2020-10-07 ENCOUNTER — Encounter: Payer: Self-pay | Admitting: Physician Assistant

## 2020-10-07 DIAGNOSIS — G43909 Migraine, unspecified, not intractable, without status migrainosus: Secondary | ICD-10-CM | POA: Diagnosis not present

## 2020-10-07 DIAGNOSIS — F411 Generalized anxiety disorder: Secondary | ICD-10-CM

## 2020-10-07 DIAGNOSIS — F902 Attention-deficit hyperactivity disorder, combined type: Secondary | ICD-10-CM

## 2020-10-07 DIAGNOSIS — G2581 Restless legs syndrome: Secondary | ICD-10-CM | POA: Diagnosis not present

## 2020-10-07 DIAGNOSIS — G4700 Insomnia, unspecified: Secondary | ICD-10-CM

## 2020-10-07 DIAGNOSIS — F319 Bipolar disorder, unspecified: Secondary | ICD-10-CM

## 2020-10-07 MED ORDER — ARIPIPRAZOLE 15 MG PO TABS: ORAL_TABLET | ORAL | 1 refills | Status: DC

## 2020-10-07 MED ORDER — AMPHETAMINE-DEXTROAMPHET ER 30 MG PO CP24: 30.0000 mg | ORAL_CAPSULE | Freq: Every day | ORAL | 0 refills | Status: DC

## 2020-10-07 MED ORDER — QUETIAPINE FUMARATE 50 MG PO TABS: 150.0000 mg | ORAL_TABLET | Freq: Every day | ORAL | 1 refills | Status: DC

## 2020-10-07 MED ORDER — AMITRIPTYLINE HCL 50 MG PO TABS: 50.0000 mg | ORAL_TABLET | Freq: Every evening | ORAL | 1 refills | Status: DC

## 2020-10-07 MED ORDER — AMPHETAMINE-DEXTROAMPHET ER 30 MG PO CP24: ORAL_CAPSULE | ORAL | 0 refills | Status: DC

## 2020-10-07 NOTE — Progress Notes (Signed)
[facility] Med Check  Patient ID: AKSHARA BLUMENTHAL,  MRN: [id number]  PCP: Pearline Cables, MD  Date of Evaluation: 10/07/2020 Time spent:25 minutes  Chief Complaint:  Chief Complaint   ADD; Depression; Insomnia     Virtual Visit via Telehealth  I connected with patient by telephone, with their informed consent, and verified patient privacy and that I am speaking with the correct person using two identifiers.  I am private, in my office and the patient is at work.  I discussed the limitations, risks, security and privacy concerns of performing an evaluation and management service by telephone and the availability of in person appointments. I also discussed with the patient that there may be a patient responsible charge related to this service. The patient expressed understanding and agreed to proceed.  Unable to get on caregility after several tries.    I discussed the assessment and treatment plan with the patient. The patient was provided an opportunity to ask questions and all were answered. The patient agreed with the plan and demonstrated an understanding of the instructions.   The patient was advised to call back or seek an in-person evaluation if the symptoms worsen or if the condition fails to improve as anticipated.  I provided 25 minutes of non-face-to-face time during this encounter.   HISTORY/CURRENT STATUS: HPI for 34-month med check.  She is doing well.  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  Her father-in-law has terminal cancer, that is "the only thing in life that sucks right now."  Patient denies loss of interest in usual activities and is able to enjoy things.  Denies decreased energy or motivation.  Appetite has not changed.  No extreme sadness, tearfulness, or feelings of hopelessness.  Denies any changes in concentration, making decisions or remembering things.  Adderall is still working well.  She is sleeping fine.  The  amitriptyline helps prevent migraines as well as helps her sleep.  Denies suicidal or homicidal thoughts.  Patient denies increased energy with decreased need for sleep, no increased talkativeness, no racing thoughts, no impulsivity or risky behaviors, no increased spending, no increased libido, no grandiosity, no increased irritability or anger, and no hallucinations.  Denies dizziness, syncope, seizures, numbness, tingling, tremor, tics, unsteady gait, slurred speech, confusion. Denies muscle or joint pain, stiffness, or dystonia.  Individual Medical History/ Review of Systems: Changes? :No   RA got really bad several months ago. New Rheumatologist, and new treatment, doing great.   Allergies: Coconut oil, Codeine, Sulfa antibiotics, and Trazodone and nefazodone  Current Medications:  Current Outpatient Medications:    amphetamine-dextroamphetamine (ADDERALL XR) 30 MG 24 hr capsule, TAKE 1 CAPSULE BY MOUTH EVERY DAY WITH BREAKFAST, Disp: 30 capsule, Rfl: 0   Ascorbic Acid (VITAMIN C) 100 MG tablet, Take 100 mg by mouth daily., Disp: , Rfl:    calcium citrate-vitamin D (CITRACAL+D) 315-200 MG-UNIT tablet, Take 1 tablet by mouth 2 (two) times daily., Disp: , Rfl:    cholecalciferol (VITAMIN D) 1000 UNITS tablet, Take 1,000 Units by mouth daily., Disp: , Rfl:    diclofenac (VOLTAREN) 25 MG EC tablet, Take 1 tablet (25 mg total) by mouth 2 (two) times daily as needed., Disp: 60 tablet, Rfl: 2   ENBREL SURECLICK 50 MG/ML injection, INJECT 1 PEN UNDER THE SKIN EVERY 7 DAYS., Disp: 4 mL, Rfl: 0   gabapentin (NEURONTIN) 300 MG capsule, TAKE 1 CAPSULE BY MOUTH EVERY MORNING, 1CAPSULE AT  NOON, AND 2 CAPSULES ABOUT THREE HOURS PRIOR TO BEDTIME (Patient taking differently: 300 mg at bedtime.), Disp: 120 capsule, Rfl: 1   hydroxychloroquine (PLAQUENIL) 200 MG tablet, Take 1 tablet (200 mg total) by mouth 2 (two) times daily., Disp: 60 tablet, Rfl: 2   levothyroxine (SYNTHROID) 200 MCG tablet, Take 1 tablet  (200 mcg total) by mouth daily before breakfast., Disp: 30 tablet, Rfl: 6   lisinopril (ZESTRIL) 10 MG tablet, TAKE 1 TABLET BY MOUTH EVERY DAY, Disp: 90 tablet, Rfl: 0   omeprazole (PRILOSEC) 40 MG capsule, Take 1 capsule (40 mg total) by mouth 2 (two) times daily as needed., Disp: 180 capsule, Rfl: 3   rizatriptan (MAXALT-MLT) 10 MG disintegrating tablet, TAKE 1 TABLET BY MOUTH AT ONSET OF HEADACHE. MAY REPEAT ONCE IN 2 HOURS IF NEEDED, Disp: 12 tablet, Rfl: 2   Specialty Vitamins Products (MAGNESIUM, AMINO ACID CHELATE,) 133 MG tablet, Take 1 tablet by mouth 2 (two) times daily., Disp: , Rfl:    alendronate (FOSAMAX) 70 MG tablet, Take 70 mg by mouth. (Patient not taking: No sig reported), Disp: , Rfl:    Alum & Mag Hydroxide-Simeth (GI COCKTAIL) SUSP suspension, Take 30 mLs by mouth 2 (two) times daily. Shake well. (Patient not taking: Reported on 10/07/2020), Disp: 300 mL, Rfl: 0   amitriptyline (ELAVIL) 50 MG tablet, Take 1 tablet (50 mg total) by mouth every evening., Disp: 90 tablet, Rfl: 1   [START ON 12/19/2020] amphetamine-dextroamphetamine (ADDERALL XR) 30 MG 24 hr capsule, Take 1 capsule (30 mg total) by mouth daily., Disp: 30 capsule, Rfl: 0   [START ON 11/21/2020] amphetamine-dextroamphetamine (ADDERALL XR) 30 MG 24 hr capsule, Take 1 capsule (30 mg total) by mouth daily., Disp: 30 capsule, Rfl: 0   [START ON 10/22/2020] amphetamine-dextroamphetamine (ADDERALL XR) 30 MG 24 hr capsule, TAKE 1 CAPSULE BY MOUTH EVERY DAY WITH BREAKFAST, Disp: 30 capsule, Rfl: 0   ARIPiprazole (ABILIFY) 15 MG tablet, TAKE 1 TABLET BY MOUTH EACH DAY, Disp: 90 tablet, Rfl: 1   Dexlansoprazole 30 MG capsule, Take 1 capsule (30 mg total) by mouth daily. (Patient not taking: Reported on 07/29/2020), Disp: 30 capsule, Rfl: 3   Insulin Pen Needle (PEN NEEDLES) 31G X 6 MM MISC, 1 each by Does not apply route daily. (Patient not taking: Reported on 10/07/2020), Disp: 50 each, Rfl: prn   QUEtiapine (SEROQUEL) 50 MG  tablet, Take 3 tablets (150 mg total) by mouth at bedtime., Disp: 270 tablet, Rfl: 1   Semaglutide-Weight Management (WEGOVY) 0.5 MG/0.5ML SOAJ, Inject 0.5 mg into the skin once a week. (Patient not taking: No sig reported), Disp: 2 mL, Rfl: 0 Medication Side Effects: none  Family Medical/ Social History: Changes?  New job    MENTAL HEALTH EXAM:  There were no vitals taken for this visit.There is no height or weight on file to calculate BMI.  General Appearance:  Unable to assess  Eye Contact:   Unable to assess  Speech:  Clear and Coherent and Normal Rate  Volume:  Normal  Mood:  Euthymic  Affect:   Unable to assess  Thought Process:  Goal Directed and Descriptions of Associations: Intact  Orientation:  Full (Time, Place, and Person)  Thought Content: Logical   Suicidal Thoughts:  No  Homicidal Thoughts:  No  Memory:  WNL  Judgement:  Good  Insight:  Good  Psychomotor Activity:   Unable to assess  Concentration:  Concentration: Good and Attention Span: Good  Recall:  Good  Fund  of Knowledge: Good  Language: Good  Assets:  Desire for Improvement  ADL's:  Intact  Cognition: WNL  Prognosis:  Good    DIAGNOSES:    ICD-10-CM   1. Attention deficit hyperactivity disorder (ADHD), combined type  F90.2     2. Migraine without status migrainosus, not intractable, unspecified migraine type  G43.909 amitriptyline (ELAVIL) 50 MG tablet    3. Bipolar I disorder (HCC)  F31.9     4. Restless leg syndrome  G25.81     5. Insomnia, unspecified type  G47.00     6. Generalized anxiety disorder  F41.1        Receiving Psychotherapy: No    RECOMMENDATIONS:  PDMP was reviewed. Last Adderall 09/23/2020. I provided 25 minutes of non-face-to-face time during this encounter, including time spent before and after the visit in records review, medical decision making, and charting.  I am sorry to hear about her father-in-law. She is doing well with her medications so no changes are  necessary. Continue Adderall XR 30 mg, 1 p.o. every morning. Continue amitriptyline 50 mg, 1 p.o. nightly. Continue Abilify 15 mg, 1 p.o. every morning. Continue gabapentin 300 mg 1 p.o. qhs. Continue Seroquel 50 mg, 3 p.o. nightly, for sleep. Return in 6 months.   Melony Overly, PA-C

## 2020-10-19 NOTE — Progress Notes (Signed)
Office Visit Note  Patient: Grace Butler             Date of Birth: [birth year]           MRN: [id number]             PCP: Darreld Mclean, MD Referring: Darreld Mclean, MD Visit Date: 10/20/2020   Subjective:  Follow-up (Having a flare, patient has not had COVID vaccines)  History of Present Illness: Grace Butler is a 50 y.o. female here for follow up for seronegative rheumatoid arthritis after restarting treatment with Enbrel 50 mg [place] weekly with continued HCQ 400 mg PO daily. Workup at that visit with negative inflammatory serology, xray of left knee does show osteoarthritis changes. She had been feeling pretty well after starting Enbrel although continuing to take diclofenac 25 mg BID regularly. However in the past few weeks she is feeling worse, with several major life and family stressors going on. 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 Her joint pain is worse especially knees and ankles. Not especially stiff though. Recent fall onto right elbow and right knee without major injury. Had right knee injection with orthopedic surgery for pain and swelling there, with an improvement.  Previous HPI 07/29/20 [name] is a 50 y.o. female here for transition of care for seronegative rheumatoid arthritis previously managed with [facility] rheumatology.  Rheumatoid arthritis was diagnosed in 2013 with development of bilateral hand and feet swelling and morning stiffness.  Previous treatments include methotrexate stopped due to elevated transaminases.  She was treated with Humira for about 6 months but with increasing injection site reaction and pain did not tolerate this well.  She was switched to Enbrel continued this for several months but also stopped due to the same problem of local injection site reactions and  pain.  She is not sure whether the medicine was ever effective due to early discontinuation.  She started [place] in 2016 she felt greatly improved her symptoms continue this medicine until 2021 she does question whether there was loss of efficacy.  After Grace Butler she started hydroxychloroquine has been on this since October 2021 does not feel this medication has been highly effective for her joint pain.  Currently she complains of daily pain worst in her bilateral knees and feet.  She describes the pain as sharp but states her foot pain feels more like a pressure sensation.  The knee pain is exacerbated climbing stairs.  She notices stiffness pain and redness over her MCP joints does not describe discrete swelling in this area lately.  She is not sure whether there is knee or ankle swelling related to her arthritis body habitus and swelling and redness she gets in the feet from standing during the day.  Currently she takes Celebrex once daily in the mornings and takes Tylenol usually twice a day for her ongoing joint pain.  She previously took oral diclofenac that she found helpful but was switched due to concern of long-term NSAID exposure.  She takes hydroxychloroquine 400 mg/day.   DMARD Hx MTX d/c LFTs Humira d/c injection site reactions Enbrel d/c injection site reactions Grace Butler ?loss of efficacy Hydroxychloroquine current, incomplete response   Labs reviewed 07/2020 ESR 33 CBC wnl CMP wnl   02/2014 HBV/HCV neg RF neg CCP neg Vit D  32   Review of Systems  Constitutional:  Positive for fatigue.  HENT:  Positive for mouth dryness.   Eyes:  Negative for dryness.  Respiratory:  Negative for shortness of breath.   Cardiovascular:  Negative for swelling in legs/feet.  Gastrointestinal:  Positive for constipation.  Endocrine: Positive for heat intolerance.  Genitourinary:  Negative for difficulty urinating.  Musculoskeletal:  Positive for joint pain, joint pain, morning stiffness and  muscle tenderness.  Skin:  Negative for rash.  Allergic/Immunologic: Negative for susceptible to infections.  Neurological:  Negative for numbness.  Hematological:  Negative for bruising/bleeding tendency.  Psychiatric/Behavioral:  Negative for sleep disturbance.    PMFS History:  Patient Active Problem List   Diagnosis Date Noted   High risk medication use 07/29/2020   Bilateral knee pain 07/29/2020   Attention deficit hyperactivity disorder (ADHD) 09/28/2017   Insomnia, unspecified 09/28/2017   Acute meniscal tear of knee, left, subsequent encounter 02/10/2017   Left knee pain 09/01/2016   Obesity 01/14/2016   Rheumatoid arthritis ([place]) 01/14/2016   Hypothyroidism due to acquired atrophy of thyroid 01/14/2016   Osteopenia 09/15/2015   Bipolar 1 disorder ([place]) 08/21/2015   Gastroesophageal reflux disease without esophagitis 08/21/2015   Migraine 08/21/2015    Past Medical History:  Diagnosis Date   Bipolar 1 disorder ([place])    Chronic headache    Depression    Gallstone    Hypertension    IBS (irritable bowel syndrome)    Obesity    Thyroid disease    Ulcer     Family History  Problem Relation Age of Onset   Hypertension Mother    Heart attack Father    Healthy Sister    Healthy Sister    Healthy Sister    Healthy Brother    Healthy Son    Rheum arthritis Paternal Uncle    Colon cancer Neg Hx    Esophageal cancer Neg Hx    Colon polyps Neg Hx    Rectal cancer Neg Hx    Stomach cancer Neg Hx    Past Surgical History:  Procedure Laterality Date   ABDOMINAL HYSTERECTOMY     APPENDECTOMY     CHOLECYSTECTOMY     COLONOSCOPY  09/07/2018   CYSTECTOMY     dermoid   ESOPHAGOGASTRODUODENOSCOPY     about 20 years ago to check for ulcers   KNEE ARTHROSCOPY Left 2019   MYOMECTOMY     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 53   UPPER GASTROINTESTINAL ENDOSCOPY     Social History   Social History Narrative   Not on file   Immunization History  Administered Date(s)  Administered   Influenza,inj,Quad PF,6+ Mos 01/14/2016, 09/13/2017   Influenza-Unspecified 10/31/2016, 08/06/2018   Pneumococcal Conjugate-13 03/24/2014   Pneumococcal Polysaccharide-23 05/26/2014   Pneumococcal-Unspecified 05/26/2014   Tdap 11/10/2017     Objective: Vital Signs: BP 120/76 (BP Location: Right Arm, Patient Position: Sitting, Cuff Size: Large)   Pulse 85   Resp 17   Ht 5' 8.5" (1.74 m)   Wt (!) 327 lb (148.3 kg)   BMI 49.00 kg/m    Physical Exam Constitutional:      Appearance: She is obese.  Cardiovascular:     Rate and Rhythm: Normal rate and regular rhythm.  Pulmonary:     Effort: Pulmonary effort is normal.     Breath sounds: Normal breath sounds.  Skin:    General: Skin is warm and dry.     Findings: No  rash.  Neurological:     Mental Status: She is alert.  Psychiatric:        Mood and Affect: Mood normal.     Musculoskeletal Exam:  Shoulders full ROM no tenderness or swelling Elbows full ROM no tenderness or swelling Wrists full ROM no tenderness or swelling Fingers full ROM no tenderness or swelling Knees full ROM, joint line tenderness to palpation, no effusions Ankles full ROM no tenderness or swelling  CDAI Exam: CDAI Score: 9  Patient Global: 50 mm; Provider Global: 20 mm Swollen: 0 ; Tender: 2  Joint Exam 10/20/2020      Right  Left  Knee   Tender   Tender     Investigation: No additional findings.  Imaging: No results found.  Recent Labs: Lab Results  Component Value Date   WBC 5.4 10/20/2020   HGB 14.9 10/20/2020   PLT 254 10/20/2020   NA 141 10/20/2020   K 4.2 10/20/2020   CL 104 10/20/2020   CO2 29 10/20/2020   GLUCOSE 79 10/20/2020   BUN 12 10/20/2020   CREATININE 0.77 10/20/2020   BILITOT 0.5 10/20/2020   ALKPHOS 73 01/30/2020   AST 25 10/20/2020   ALT 28 10/20/2020   PROT 7.3 10/20/2020   ALBUMIN 4.2 01/30/2020   CALCIUM 10.0 10/20/2020   GFRAA >60 04/23/2019   QFTBGOLDPLUS NEGATIVE 07/29/2020     Speciality Comments: No specialty comments available.  Procedures:  No procedures performed Allergies: Coconut oil, Codeine, Sulfa antibiotics, and Trazodone and nefazodone   Assessment / Plan:     Visit Diagnoses: Rheumatoid arthritis with negative rheumatoid factor, involving unspecified site ([place]) - Plan: Sedimentation rate, C-reactive protein, hydroxychloroquine (PLAQUENIL) 200 MG tablet, diclofenac (VOLTAREN) 25 MG EC tablet, etanercept (ENBREL SURECLICK) 50 MG/ML injection  Increase in joint pains but no objectively swollen or inflamed areas noticeable today. Would like to avoid steroid treatments if possible, and had recent knee injection already.  Will recheck inflammatory markers, if these are very normal today would recommend no change in treatment plan, suspect increased problems are related to acute stressors. Continue Enbrel 50 mg [place] weekly, hydroxychloroquine 400 mg PO daily, and diclofenac 25 mg PO BID PRN.  Chronic pain of both knees  Increased knee pain no large swelling noticeable no change in ROM noticeable today. She does have some osteoarthritis, with prior injuries to the left knee meniscus. I suspect this is in exacerbation with stress, work, and maybe also weather change.  High risk medication use - Plan: COMPLETE METABOLIC PANEL WITH GFR, CBC with Differential/Platelet  Newly restarted TNF inhibitor treatment with Enbrel needs medication monitoring with CBC and CMP today.  Orders: Orders Placed This Encounter  Procedures   Sedimentation rate   C-reactive protein   COMPLETE METABOLIC PANEL WITH GFR   CBC with Differential/Platelet    Meds ordered this encounter  Medications   hydroxychloroquine (PLAQUENIL) 200 MG tablet    Sig: Take 1 tablet (200 mg total) by mouth 2 (two) times daily.    Dispense:  60 tablet    Refill:  5   diclofenac (VOLTAREN) 25 MG EC tablet    Sig: Take 1 tablet (25 mg total) by mouth 2 (two) times daily as needed.    Dispense:   60 tablet    Refill:  5   etanercept (ENBREL SURECLICK) 50 MG/ML injection    Sig: INJECT 1 PEN UNDER THE SKIN EVERY 7 DAYS.    Dispense:  4 mL    Refill:  6      Follow-Up Instructions: Return in about 3 months (around 01/20/2021) for RA on ENB/HCQ/NSAID f/u 39mo.   CCollier Salina MD  Note - This record has been created using [employer]  Chart creation errors have been sought, but may not always  have been located. Such creation errors do not reflect on  the standard of medical care.

## 2020-10-20 ENCOUNTER — Ambulatory Visit: Payer: BC Managed Care – PPO | Admitting: Internal Medicine

## 2020-10-20 ENCOUNTER — Encounter: Payer: Self-pay | Admitting: Internal Medicine

## 2020-10-20 ENCOUNTER — Other Ambulatory Visit: Payer: Self-pay

## 2020-10-20 VITALS — BP 120/76 | HR 85 | Resp 17 | Ht 68.5 in | Wt 327.0 lb

## 2020-10-20 DIAGNOSIS — G4700 Insomnia, unspecified: Secondary | ICD-10-CM

## 2020-10-20 DIAGNOSIS — M25561 Pain in right knee: Secondary | ICD-10-CM

## 2020-10-20 DIAGNOSIS — Z79899 Other long term (current) drug therapy: Secondary | ICD-10-CM

## 2020-10-20 DIAGNOSIS — M0600 Rheumatoid arthritis without rheumatoid factor, unspecified site: Secondary | ICD-10-CM

## 2020-10-20 DIAGNOSIS — G8929 Other chronic pain: Secondary | ICD-10-CM

## 2020-10-20 DIAGNOSIS — M25562 Pain in left knee: Secondary | ICD-10-CM

## 2020-10-20 MED ORDER — ENBREL SURECLICK 50 MG/ML ~~LOC~~ SOAJ: SUBCUTANEOUS | 6 refills | Status: DC

## 2020-10-20 MED ORDER — HYDROXYCHLOROQUINE SULFATE 200 MG PO TABS: 200.0000 mg | ORAL_TABLET | Freq: Two times a day (BID) | ORAL | 5 refills | Status: DC

## 2020-10-20 MED ORDER — DICLOFENAC SODIUM 25 MG PO TBEC: 25.0000 mg | DELAYED_RELEASE_TABLET | Freq: Two times a day (BID) | ORAL | 5 refills | Status: DC | PRN

## 2020-10-21 LAB — COMPLETE METABOLIC PANEL WITH GFR
AG Ratio: 1.6 (calc) (ref 1.0–2.5)
ALT: 28 U/L (ref 6–29)
AST: 25 U/L (ref 10–35)
Albumin: 4.5 g/dL (ref 3.6–5.1)
Alkaline phosphatase (APISO): 86 U/L (ref 31–125)
BUN: 12 mg/dL (ref 7–25)
CO2: 29 mmol/L (ref 20–32)
Calcium: 10 mg/dL (ref 8.6–10.2)
Chloride: 104 mmol/L (ref 98–110)
Creat: 0.77 mg/dL (ref 0.50–0.99)
Globulin: 2.8 g/dL (calc) (ref 1.9–3.7)
Glucose, Bld: 79 mg/dL (ref 65–99)
Potassium: 4.2 mmol/L (ref 3.5–5.3)
Sodium: 141 mmol/L (ref 135–146)
Total Bilirubin: 0.5 mg/dL (ref 0.2–1.2)
Total Protein: 7.3 g/dL (ref 6.1–8.1)
eGFR: 95 mL/min/{1.73_m2} (ref >=60)

## 2020-10-21 LAB — SEDIMENTATION RATE: Sed Rate: 2 mm/h (ref 0–20)

## 2020-10-21 LAB — CBC WITH DIFFERENTIAL/PLATELET
Absolute Monocytes: 356 cells/uL (ref 200–950)
Basophils Absolute: 49 cells/uL (ref 0–200)
Basophils Relative: 0.9 %
Eosinophils Absolute: 167 cells/uL (ref 15–500)
Eosinophils Relative: 3.1 %
HCT: 44.3 % (ref 35.0–45.0)
Hemoglobin: 14.9 g/dL (ref 11.7–15.5)
Lymphs Abs: 1766 cells/uL (ref 850–3900)
MCH: 31.7 pg (ref 27.0–33.0)
MCHC: 33.6 g/dL (ref 32.0–36.0)
MCV: 94.3 fL (ref 80.0–100.0)
MPV: 10.4 fL (ref 7.5–12.5)
Monocytes Relative: 6.6 %
Neutro Abs: 3062 cells/uL (ref 1500–7800)
Neutrophils Relative %: 56.7 %
Platelets: 254 10*3/uL (ref 140–400)
RBC: 4.7 10*6/uL (ref 3.80–5.10)
RDW: 11.7 % (ref 11.0–15.0)
Total Lymphocyte: 32.7 %
WBC: 5.4 10*3/uL (ref 3.8–10.8)

## 2020-10-21 LAB — C-REACTIVE PROTEIN: CRP: 2.3 mg/L (ref <8.0)

## 2020-10-26 NOTE — Progress Notes (Signed)
Lab results look fine for continuing current arthritis medications. Her inflammation markers are normal so I do not think symptoms represent a disease flare, probably more use related and stress related so would not change medication plan at this time.

## 2020-10-27 ENCOUNTER — Telehealth: Payer: Self-pay

## 2020-10-27 DIAGNOSIS — M0600 Rheumatoid arthritis without rheumatoid factor, unspecified site: Secondary | ICD-10-CM

## 2020-10-27 NOTE — Telephone Encounter (Signed)
Patient seems to have had a change of insurance and a new PA is required.  Submitted a Prior Authorization request to [facility] for ENBREL via CoverMyMeds. Will update once we receive a response.   Key: [id number]

## 2020-10-30 ENCOUNTER — Telehealth: Payer: Self-pay

## 2020-10-30 ENCOUNTER — Other Ambulatory Visit (HOSPITAL_COMMUNITY): Payer: Self-pay

## 2020-10-30 MED ORDER — ENBREL SURECLICK 50 MG/ML ~~LOC~~ SOAJ
SUBCUTANEOUS | 5 refills | Status: DC
  Filled 2020-10-30: qty 4, 28d supply, fill #0
  Filled 2020-11-19: qty 4, 28d supply, fill #1
  Filled 2020-12-14 – 2020-12-18 (×4): qty 4, 28d supply, fill #2

## 2020-10-30 NOTE — Telephone Encounter (Signed)
Received notification from [facility] regarding a prior authorization for ENBREL. Authorization has been APPROVED from 10/27/2020 to 10/26/2021.   Per test claim, copay for 28 days supply is $50.  Patient can fill through [facility] per test claim, however pt has been told by insurance that she will need to fill through [facility]: [phone] but can also be approved to pick it up from a local [facility].  Authorization # [id number]

## 2020-10-30 NOTE — Telephone Encounter (Signed)
Spoke to pt and shared with her the most recent updates. Informed her that the ability to pick up the medication from a local [facility] location would be subject to availability, and that from personal experience working in [employer] I have never known them to regularly keep specialty meds on hand that are not ordered for a specific patient. Pt verbalized understanding.  I informed her that I was able to run a test claim successfully through the [facility] and that this would be her best option to get it locally by this weekend. Pt is amenable to this option and still has her Enbrel Copay card on hand. As it is a debit card I advised her to bring it with her at time of medication pick up.   Routing to [facility] to send Rx in to [facility].

## 2020-10-30 NOTE — Telephone Encounter (Signed)
Rx for Enbrel [facility] sent to [facility] with note that patient will pickup  Chesley Mires, PharmD, MPH, BCPS Clinical Pharmacist (Rheumatology and Pulmonology)

## 2020-10-30 NOTE — Telephone Encounter (Signed)
Returned call to pt and left VM requesting return call. Direct number provided. Will continue to provide updates in separate ongoing encounter.

## 2020-10-30 NOTE — Telephone Encounter (Signed)
Patient called stating she needs the PA for her Enbrel medication to be sent as URGENT.  Patient was told it would be reviewed in 72 hours versus 15 days.  Patient states her insurance requires her to have it filled through [facility].  Patient was also told they could approve it for a local [facility] pharmacy so she can pick it up this weekend.  Patient requested a return call.

## 2020-11-10 ENCOUNTER — Telehealth: Payer: BC Managed Care – PPO | Admitting: Physician Assistant

## 2020-11-10 DIAGNOSIS — J019 Acute sinusitis, unspecified: Secondary | ICD-10-CM | POA: Diagnosis not present

## 2020-11-10 DIAGNOSIS — B9689 Other specified bacterial agents as the cause of diseases classified elsewhere: Secondary | ICD-10-CM | POA: Diagnosis not present

## 2020-11-10 MED ORDER — AMOXICILLIN-POT CLAVULANATE 875-125 MG PO TABS: 1.0000 | ORAL_TABLET | Freq: Two times a day (BID) | ORAL | 0 refills | Status: DC

## 2020-11-10 MED ORDER — FLUTICASONE PROPIONATE 50 MCG/ACT NA SUSP: 2.0000 | Freq: Every day | NASAL | 0 refills | Status: DC

## 2020-11-10 MED ORDER — PROMETHAZINE-DM 6.25-15 MG/5ML PO SYRP: 5.0000 mL | ORAL_SOLUTION | Freq: Four times a day (QID) | ORAL | 0 refills | Status: DC | PRN

## 2020-11-10 NOTE — Progress Notes (Signed)
Virtual Visit Consent   Grace Butler, you are scheduled for a virtual visit with a [facility] provider today.     Just as with appointments in the office, your consent must be obtained to participate.  Your consent will be active for this visit and any virtual visit you may have with one of our providers in the next 365 days.     If you have a MyChart account, a copy of this consent can be sent to you electronically.  All virtual visits are billed to your insurance company just like a traditional visit in the office.    As this is a virtual visit, video technology does not allow for your provider to perform a traditional examination.  This may limit your provider's ability to fully assess your condition.  If your provider identifies any concerns that need to be evaluated in person or the need to arrange testing (such as labs, EKG, etc.), we will make arrangements to do so.     Although advances in technology are sophisticated, we cannot ensure that it will always work on either your end or our end.  If the connection with a video visit is poor, the visit may have to be switched to a telephone visit.  With either a video or telephone visit, we are not always able to ensure that we have a secure connection.     I need to obtain your verbal consent now.   Are you willing to proceed with your visit today?    Grace Butler has provided verbal consent on 11/10/2020 for a virtual visit (video or telephone).   Grace Butler, [place]   Date: 11/10/2020 3:59 PM   Virtual Visit via Video Note   I, Grace Butler, connected with  Grace Butler  ([id number], [birth year]) on 11/10/20 at  3:45 PM EST by a video-enabled telemedicine application and verified that I am speaking with the correct person using two identifiers.  Location: Patient: Virtual Visit Location Patient: Home Provider: Virtual Visit Location Provider: Home Office   I discussed the limitations of evaluation and management by  telemedicine and the availability of in person appointments. The patient expressed understanding and agreed to proceed.    History of Present Illness: Grace Butler is a 50 y.o. who identifies as a female who was assigned female at birth, and is being seen today for sinus symptoms starting last week. Notes significant sinus pressure and congestion with bilateral maxillary pain. Denies fever, chills, aches. Denies ear pain. Some chest congestion and cough productive of white sputum. Has taken multiple OTC medications including Nyquil/Dayquil, Robitussin and Mucinex without much improvement.   HPI: HPI  Problems:  Patient Active Problem List   Diagnosis Date Noted   High risk medication use 07/29/2020   Bilateral knee pain 07/29/2020   Attention deficit hyperactivity disorder (ADHD) 09/28/2017   Insomnia, unspecified 09/28/2017   Acute meniscal tear of knee, left, subsequent encounter 02/10/2017   Left knee pain 09/01/2016   Obesity 01/14/2016   Rheumatoid arthritis (H[facility]) 01/14/2016   Hypothyroidism due to acquired atrophy of thyroid 01/14/2016   Osteopenia 09/15/2015   Bipolar 1 disorder (HCC) 08/21/2015   Gastroesophageal reflux disease without esophagitis 08/21/2015   Migraine 08/21/2015    Allergies:  Allergies  Allergen Reactions   Coconut Oil Anaphylaxis   Codeine    Sulfa Antibiotics Hives   Trazodone And Nefazodone     Vivid dreams   Medications:  Current Outpatient Medications:  amoxicillin-clavulanate (AUGMENTIN) 875-125 MG tablet, Take 1 tablet by mouth 2 (two) times daily., Disp: 14 tablet, Rfl: 0   fluticasone (FLONASE) 50 MCG/ACT nasal spray, Place 2 sprays into both nostrils daily., Disp: 16 g, Rfl: 0   promethazine-dextromethorphan (PROMETHAZINE-DM) 6.25-15 MG/5ML syrup, Take 5 mLs by mouth 4 (four) times daily as needed for cough., Disp: 118 mL, Rfl: 0   alendronate (FOSAMAX) 70 MG tablet, Take 70 mg by mouth. (Patient not taking: No sig reported), Disp: , Rfl:     Alum & Mag Hydroxide-Simeth (GI COCKTAIL) SUSP suspension, Take 30 mLs by mouth 2 (two) times daily. Shake well. (Patient not taking: No sig reported), Disp: 300 mL, Rfl: 0   amitriptyline (ELAVIL) 50 MG tablet, Take 1 tablet (50 mg total) by mouth every evening., Disp: 90 tablet, Rfl: 1   amphetamine-dextroamphetamine (ADDERALL XR) 30 MG 24 hr capsule, TAKE 1 CAPSULE BY MOUTH EVERY DAY WITH BREAKFAST, Disp: 30 capsule, Rfl: 0   [START ON 12/19/2020] amphetamine-dextroamphetamine (ADDERALL XR) 30 MG 24 hr capsule, Take 1 capsule (30 mg total) by mouth daily., Disp: 30 capsule, Rfl: 0   [START ON 11/21/2020] amphetamine-dextroamphetamine (ADDERALL XR) 30 MG 24 hr capsule, Take 1 capsule (30 mg total) by mouth daily., Disp: 30 capsule, Rfl: 0   amphetamine-dextroamphetamine (ADDERALL XR) 30 MG 24 hr capsule, TAKE 1 CAPSULE BY MOUTH EVERY DAY WITH BREAKFAST, Disp: 30 capsule, Rfl: 0   ARIPiprazole (ABILIFY) 15 MG tablet, TAKE 1 TABLET BY MOUTH EACH DAY, Disp: 90 tablet, Rfl: 1   Ascorbic Acid (VITAMIN C) 100 MG tablet, Take 100 mg by mouth daily., Disp: , Rfl:    calcium citrate-vitamin D (CITRACAL+D) 315-200 MG-UNIT tablet, Take 1 tablet by mouth 2 (two) times daily., Disp: , Rfl:    cholecalciferol (VITAMIN D) 1000 UNITS tablet, Take 1,000 Units by mouth daily., Disp: , Rfl:    Dexlansoprazole 30 MG capsule, Take 1 capsule (30 mg total) by mouth daily. (Patient not taking: Reported on 10/20/2020), Disp: 30 capsule, Rfl: 3   diclofenac (VOLTAREN) 25 MG EC tablet, Take 1 tablet (25 mg total) by mouth 2 (two) times daily as needed., Disp: 60 tablet, Rfl: 5   etanercept (ENBREL SURECLICK) 50 MG/ML injection, INJECT 1 PEN UNDER THE SKIN EVERY 7 DAYS., Disp: 4 mL, Rfl: 5   gabapentin (NEURONTIN) 300 MG capsule, TAKE 1 CAPSULE BY MOUTH EVERY MORNING, 1CAPSULE AT NOON, AND 2 CAPSULES ABOUT THREE HOURS PRIOR TO BEDTIME (Patient taking differently: 300 mg at bedtime.), Disp: 120 capsule, Rfl: 1    hydroxychloroquine (PLAQUENIL) 200 MG tablet, Take 1 tablet (200 mg total) by mouth 2 (two) times daily., Disp: 60 tablet, Rfl: 5   Insulin Pen Needle (PEN NEEDLES) 31G X 6 MM MISC, 1 each by Does not apply route daily., Disp: 50 each, Rfl: prn   levothyroxine (SYNTHROID) 200 MCG tablet, Take 1 tablet (200 mcg total) by mouth daily before breakfast., Disp: 30 tablet, Rfl: 6   lisinopril (ZESTRIL) 10 MG tablet, TAKE 1 TABLET BY MOUTH EVERY DAY, Disp: 90 tablet, Rfl: 0   omeprazole (PRILOSEC) 40 MG capsule, Take 1 capsule (40 mg total) by mouth 2 (two) times daily as needed., Disp: 180 capsule, Rfl: 3   QUEtiapine (SEROQUEL) 50 MG tablet, Take 3 tablets (150 mg total) by mouth at bedtime., Disp: 270 tablet, Rfl: 1   rizatriptan (MAXALT-MLT) 10 MG disintegrating tablet, TAKE 1 TABLET BY MOUTH AT ONSET OF HEADACHE. MAY REPEAT ONCE IN 2 HOURS IF NEEDED (Patient  not taking: Reported on 10/20/2020), Disp: 12 tablet, Rfl: 2   Semaglutide-Weight Management (WEGOVY) 0.5 MG/0.5ML SOAJ, Inject 0.5 mg into the skin once a week. (Patient not taking: No sig reported), Disp: 2 mL, Rfl: 0   Specialty Vitamins Products (MAGNESIUM, AMINO ACID CHELATE,) 133 MG tablet, Take 1 tablet by mouth 2 (two) times daily., Disp: , Rfl:   Observations/Objective: Patient is well-developed, well-nourished in no acute distress.  Resting comfortably at home.  Head is normocephalic, atraumatic.  No labored breathing. Speech is clear and coherent with logical content.  Patient is alert and oriented at baseline.   Assessment and Plan: 1. Acute bacterial sinusitis - promethazine-dextromethorphan (PROMETHAZINE-DM) 6.25-15 MG/5ML syrup; Take 5 mLs by mouth 4 (four) times daily as needed for cough.  Dispense: 118 mL; Refill: 0 - fluticasone (FLONASE) 50 MCG/ACT nasal spray; Place 2 sprays into both nostrils daily.  Dispense: 16 g; Refill: 0 - amoxicillin-clavulanate (AUGMENTIN) 875-125 MG tablet; Take 1 tablet by mouth 2 (two) times  daily.  Dispense: 14 tablet; Refill: 0 Rx Augmentin.  Increase fluids.  Rest.  Saline nasal spray.  Probiotic.  Mucinex as directed.  Humidifier in bedroom. Flonase and Tessalon per orders.  Call or return to clinic if symptoms are not improving.   Follow Up Instructions: I discussed the assessment and treatment plan with the patient. The patient was provided an opportunity to ask questions and all were answered. The patient agreed with the plan and demonstrated an understanding of the instructions.  A copy of instructions were sent to the patient via MyChart unless otherwise noted below.   The patient was advised to call back or seek an in-person evaluation if the symptoms worsen or if the condition fails to improve as anticipated.  Time:  I spent 12 minutes with the patient via telehealth technology discussing the above problems/concerns.    Grace Climes, PA-C

## 2020-11-10 NOTE — Patient Instructions (Signed)
Grace Butler, thank you for joining Grace Climes, PA-C for today's virtual visit.  While this provider is not your primary care provider (PCP), if your PCP is located in our provider database this encounter information will be shared with them immediately following your visit.  Consent: (Patient) Grace Butler provided verbal consent for this virtual visit at the beginning of the encounter.  Current Medications:  Current Outpatient Medications:    alendronate (FOSAMAX) 70 MG tablet, Take 70 mg by mouth. (Patient not taking: No sig reported), Disp: , Rfl:    Alum & Mag Hydroxide-Simeth (GI COCKTAIL) SUSP suspension, Take 30 mLs by mouth 2 (two) times daily. Shake well. (Patient not taking: No sig reported), Disp: 300 mL, Rfl: 0   amitriptyline (ELAVIL) 50 MG tablet, Take 1 tablet (50 mg total) by mouth every evening., Disp: 90 tablet, Rfl: 1   amphetamine-dextroamphetamine (ADDERALL XR) 30 MG 24 hr capsule, TAKE 1 CAPSULE BY MOUTH EVERY DAY WITH BREAKFAST, Disp: 30 capsule, Rfl: 0   [START ON 12/19/2020] amphetamine-dextroamphetamine (ADDERALL XR) 30 MG 24 hr capsule, Take 1 capsule (30 mg total) by mouth daily., Disp: 30 capsule, Rfl: 0   [START ON 11/21/2020] amphetamine-dextroamphetamine (ADDERALL XR) 30 MG 24 hr capsule, Take 1 capsule (30 mg total) by mouth daily., Disp: 30 capsule, Rfl: 0   amphetamine-dextroamphetamine (ADDERALL XR) 30 MG 24 hr capsule, TAKE 1 CAPSULE BY MOUTH EVERY DAY WITH BREAKFAST, Disp: 30 capsule, Rfl: 0   ARIPiprazole (ABILIFY) 15 MG tablet, TAKE 1 TABLET BY MOUTH EACH DAY, Disp: 90 tablet, Rfl: 1   Ascorbic Acid (VITAMIN C) 100 MG tablet, Take 100 mg by mouth daily., Disp: , Rfl:    calcium citrate-vitamin D (CITRACAL+D) 315-200 MG-UNIT tablet, Take 1 tablet by mouth 2 (two) times daily., Disp: , Rfl:    cholecalciferol (VITAMIN D) 1000 UNITS tablet, Take 1,000 Units by mouth daily., Disp: , Rfl:    Dexlansoprazole 30 MG capsule, Take 1 capsule (30 mg  total) by mouth daily. (Patient not taking: Reported on 10/20/2020), Disp: 30 capsule, Rfl: 3   diclofenac (VOLTAREN) 25 MG EC tablet, Take 1 tablet (25 mg total) by mouth 2 (two) times daily as needed., Disp: 60 tablet, Rfl: 5   etanercept (ENBREL SURECLICK) 50 MG/ML injection, INJECT 1 PEN UNDER THE SKIN EVERY 7 DAYS., Disp: 4 mL, Rfl: 5   gabapentin (NEURONTIN) 300 MG capsule, TAKE 1 CAPSULE BY MOUTH EVERY MORNING, 1CAPSULE AT NOON, AND 2 CAPSULES ABOUT THREE HOURS PRIOR TO BEDTIME (Patient taking differently: 300 mg at bedtime.), Disp: 120 capsule, Rfl: 1   hydroxychloroquine (PLAQUENIL) 200 MG tablet, Take 1 tablet (200 mg total) by mouth 2 (two) times daily., Disp: 60 tablet, Rfl: 5   Insulin Pen Needle (PEN NEEDLES) 31G X 6 MM MISC, 1 each by Does not apply route daily., Disp: 50 each, Rfl: prn   levothyroxine (SYNTHROID) 200 MCG tablet, Take 1 tablet (200 mcg total) by mouth daily before breakfast., Disp: 30 tablet, Rfl: 6   lisinopril (ZESTRIL) 10 MG tablet, TAKE 1 TABLET BY MOUTH EVERY DAY, Disp: 90 tablet, Rfl: 0   omeprazole (PRILOSEC) 40 MG capsule, Take 1 capsule (40 mg total) by mouth 2 (two) times daily as needed., Disp: 180 capsule, Rfl: 3   QUEtiapine (SEROQUEL) 50 MG tablet, Take 3 tablets (150 mg total) by mouth at bedtime., Disp: 270 tablet, Rfl: 1   rizatriptan (MAXALT-MLT) 10 MG disintegrating tablet, TAKE 1 TABLET BY MOUTH AT ONSET OF HEADACHE.  MAY REPEAT ONCE IN 2 HOURS IF NEEDED (Patient not taking: Reported on 10/20/2020), Disp: 12 tablet, Rfl: 2   Semaglutide-Weight Management (WEGOVY) 0.5 MG/0.5ML SOAJ, Inject 0.5 mg into the skin once a week. (Patient not taking: No sig reported), Disp: 2 mL, Rfl: 0   Specialty Vitamins Products (MAGNESIUM, AMINO ACID CHELATE,) 133 MG tablet, Take 1 tablet by mouth 2 (two) times daily., Disp: , Rfl:    Medications ordered in this encounter:  No orders of the defined types were placed in this encounter.    *If you need refills on other  medications prior to your next appointment, please contact your pharmacy*  Follow-Up: Call back or seek an in-person evaluation if the symptoms worsen or if the condition fails to improve as anticipated.  Other Instructions Please take antibiotic as directed.  Increase fluid intake.  Use Saline nasal spray.  Take a daily multivitamin. Use the promethazine-dm cough syrup to help with .  Place a humidifier in the bedroom.  Please call or return clinic if symptoms are not improving.  Sinusitis Sinusitis is redness, soreness, and swelling (inflammation) of the paranasal sinuses. Paranasal sinuses are air pockets within the bones of your face (beneath the eyes, the middle of the forehead, or above the eyes). In healthy paranasal sinuses, mucus is able to drain out, and air is able to circulate through them by way of your nose. However, when your paranasal sinuses are inflamed, mucus and air can become trapped. This can allow bacteria and other germs to grow and cause infection. Sinusitis can develop quickly and last only a short time (acute) or continue over a long period (chronic). Sinusitis that lasts for more than 12 weeks is considered chronic.  CAUSES  Causes of sinusitis include: Allergies. Structural abnormalities, such as displacement of the cartilage that separates your nostrils (deviated septum), which can decrease the air flow through your nose and sinuses and affect sinus drainage. Functional abnormalities, such as when the small hairs (cilia) that line your sinuses and help remove mucus do not work properly or are not present. SYMPTOMS  Symptoms of acute and chronic sinusitis are the same. The primary symptoms are pain and pressure around the affected sinuses. Other symptoms include: Upper toothache. Earache. Headache. Bad breath. Decreased sense of smell and taste. A cough, which worsens when you are lying flat. Fatigue. Fever. Thick drainage from your nose, which often is green  and may contain pus (purulent). Swelling and warmth over the affected sinuses. DIAGNOSIS  Your caregiver will perform a physical exam. During the exam, your caregiver may: Look in your nose for signs of abnormal growths in your nostrils (nasal polyps). Tap over the affected sinus to check for signs of infection. View the inside of your sinuses (endoscopy) with a special imaging device with a light attached (endoscope), which is inserted into your sinuses. If your caregiver suspects that you have chronic sinusitis, one or more of the following tests may be recommended: Allergy tests. Nasal culture A sample of mucus is taken from your nose and sent to a lab and screened for bacteria. Nasal cytology A sample of mucus is taken from your nose and examined by your caregiver to determine if your sinusitis is related to an allergy. TREATMENT  Most cases of acute sinusitis are related to a viral infection and will resolve on their own within 10 days. Sometimes medicines are prescribed to help relieve symptoms (pain medicine, decongestants, nasal steroid sprays, or saline sprays).  However, for sinusitis  related to a bacterial infection, your caregiver will prescribe antibiotic medicines. These are medicines that will help kill the bacteria causing the infection.  Rarely, sinusitis is caused by a fungal infection. In theses cases, your caregiver will prescribe antifungal medicine. For some cases of chronic sinusitis, surgery is needed. Generally, these are cases in which sinusitis recurs more than 3 times per year, despite other treatments. HOME CARE INSTRUCTIONS  Drink plenty of water. Water helps thin the mucus so your sinuses can drain more easily. Use a humidifier. Inhale steam 3 to 4 times a day (for example, sit in the bathroom with the shower running). Apply a warm, moist washcloth to your face 3 to 4 times a day, or as directed by your caregiver. Use saline nasal sprays to help moisten and clean  your sinuses. Take over-the-counter or prescription medicines for pain, discomfort, or fever only as directed by your caregiver. SEEK IMMEDIATE MEDICAL CARE IF: You have increasing pain or severe headaches. You have nausea, vomiting, or drowsiness. You have swelling around your face. You have vision problems. You have a stiff neck. You have difficulty breathing. MAKE SURE YOU:  Understand these instructions. Will watch your condition. Will get help right away if you are not doing well or get worse. Document Released: 12/20/2004 Document Revised: 03/14/2011 Document Reviewed: 01/04/2011 [facility] Patient Information 2014 [place], [place].    If you have been instructed to have an in-person evaluation today at a local Urgent Care facility, please use the link below. It will take you to a list of all of our available [facility], including address, phone number and hours of operation. Please do not delay care.  [facility]  If you or a family member do not have a primary care provider, use the link below to schedule a visit and establish care. When you choose a [facility] primary care physician or advanced practice provider, you gain a long-term partner in health. Find a Primary Care Provider  Learn more about [facility]'s in-office and virtual care options:  - Get Care Now

## 2020-11-19 ENCOUNTER — Other Ambulatory Visit (HOSPITAL_COMMUNITY): Payer: Self-pay

## 2020-11-20 ENCOUNTER — Other Ambulatory Visit (HOSPITAL_COMMUNITY): Payer: Self-pay

## 2020-12-12 ENCOUNTER — Other Ambulatory Visit: Payer: Self-pay | Admitting: Family Medicine

## 2020-12-12 DIAGNOSIS — E034 Atrophy of thyroid (acquired): Secondary | ICD-10-CM

## 2020-12-14 ENCOUNTER — Other Ambulatory Visit (HOSPITAL_COMMUNITY): Payer: Self-pay

## 2020-12-17 ENCOUNTER — Other Ambulatory Visit (HOSPITAL_COMMUNITY): Payer: Self-pay

## 2020-12-18 ENCOUNTER — Telehealth: Payer: Self-pay | Admitting: Internal Medicine

## 2020-12-18 ENCOUNTER — Other Ambulatory Visit (HOSPITAL_COMMUNITY): Payer: Self-pay

## 2020-12-18 ENCOUNTER — Telehealth: Payer: Self-pay

## 2020-12-18 DIAGNOSIS — M0600 Rheumatoid arthritis without rheumatoid factor, unspecified site: Secondary | ICD-10-CM

## 2020-12-18 MED ORDER — ENBREL SURECLICK 50 MG/ML ~~LOC~~ SOAJ
50.0000 mg | SUBCUTANEOUS | 4 refills | Status: DC
Start: 2020-12-18 — End: 2021-02-04

## 2020-12-18 NOTE — Telephone Encounter (Signed)
Kim from [facility] left a voicemail regarding patient's Enbrel Sureclick medication.  This medication required a PA and we faxed requests to the office.  We didn't receive a response so we will be putting the account on hold until an approved authorization is obtained.  Once it is received we will be happy to refill.  To initiate a PA please call  #[phone].  If you have questions for [place] here at the pharmacy you can reach [place] at [phone]

## 2020-12-18 NOTE — Telephone Encounter (Addendum)
Rx for Enbrel Sureclick sent to [facility]  Ocie Doyne, [place], called patient to notify  Chesley Mires, PharmD, MPH, BCPS Clinical Pharmacist (Rheumatology and Pulmonology)

## 2020-12-18 NOTE — Telephone Encounter (Signed)
Grace Butler from [employer] called the office for Grace Butler requesting that [facility] be added to the PA for her Embrel.

## 2020-12-18 NOTE — Telephone Encounter (Signed)
Rx sent to [facility] already.  Returned call to [facility] regarding En[facility]rel since no rx has been sent there. Per rep, rx was written 10/20/20 - advised that the rx was sent to [facility] and rx has never been sent to or filled by [facility]. Requested they cancel all tasks to prevent any further f/u calls  Chesley Mires, PharmD, MPH, BCPS Clinical Pharmacist (Rheumatology and Pulmonology)

## 2020-12-18 NOTE — Telephone Encounter (Signed)
Per previous note - patient was told she must fill with [facility].  Received notiifcation from [facility] yesterday that PA is required  Submitted a Prior Authorization request to [facility] for ENBREL via CoverMyMeds. Will update once we receive a response.  Key: Don Perking, PharmD, MPH, BCPS Clinical Pharmacist (Rheumatology and Pulmonology)

## 2020-12-22 ENCOUNTER — Other Ambulatory Visit (HOSPITAL_COMMUNITY): Payer: Self-pay

## 2020-12-22 NOTE — Telephone Encounter (Signed)
Received notification from [facility] regarding a prior authorization for ENBREL. Authorization has been APPROVED from 12/18/20 to 12/17/21.   Chesley Mires, PharmD, MPH, BCPS Clinical Pharmacist (Rheumatology and Pulmonology)

## 2020-12-24 ENCOUNTER — Other Ambulatory Visit (HOSPITAL_COMMUNITY): Payer: Self-pay

## 2020-12-29 DIAGNOSIS — R509 Fever, unspecified: Secondary | ICD-10-CM | POA: Diagnosis not present

## 2020-12-29 DIAGNOSIS — R0981 Nasal congestion: Secondary | ICD-10-CM | POA: Diagnosis not present

## 2020-12-29 DIAGNOSIS — R059 Cough, unspecified: Secondary | ICD-10-CM | POA: Diagnosis not present

## 2020-12-29 DIAGNOSIS — U071 COVID-19: Secondary | ICD-10-CM | POA: Diagnosis not present

## 2021-01-06 ENCOUNTER — Other Ambulatory Visit: Payer: Self-pay | Admitting: Family Medicine

## 2021-01-06 DIAGNOSIS — I10 Essential (primary) hypertension: Secondary | ICD-10-CM

## 2021-01-18 NOTE — Progress Notes (Signed)
Office Visit Note  Patient: Grace Butler             Date of Birth: 10-Jun-1970           MRN: [id number]             PCP: Darreld Mclean, MD Referring: Darreld Mclean, MD Visit Date: 01/19/2021   Subjective:  Follow-up (Not doing good, stiffness bil knees, bil feet and bil hips)   History of Present Illness: Grace Butler is a 51 y.o. female here for follow up for seronegative RA on Enbrel 50 mg [place] weekly, HCQ 400 mg PO daily, and diclofenac 25 mg PRN. She has been doing well most of the time without a RA flare up but for the last few weeks having increased stiffness in bilateral legs, also pain but less of a problem than the stiffness. She fell twice without major injury. She has an appointment to start working with a Physiological scientist on Thursday plans to do resistance training for strengthening and ideally some weight loss. She is interested in information about Rinvoq after hearing about the medication.  Previous HPI 10/20/20 Grace Butler is a 51 y.o. female here for follow up for seronegative rheumatoid arthritis after restarting treatment with Enbrel 50 mg [place] weekly with continued HCQ 400 mg PO daily. Workup at that visit with negative inflammatory serology, xray of left knee does show osteoarthritis changes. She had been feeling pretty well after starting Enbrel although continuing to take diclofenac 25 mg BID regularly. However in the past few weeks she is feeling worse, with several major life and family stressors going on. 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 Her joint pain is worse especially knees and ankles. Not especially stiff though. Recent fall onto right elbow and right knee without major injury. Had right knee injection with orthopedic surgery for pain and swelling there, with an improvement.  Previous  HPI 07/29/20 Grace Butler is a 51 y.o. female here for transition of care for seronegative rheumatoid arthritis previously managed with [facility] rheumatology.  Rheumatoid arthritis was diagnosed in 2013 with development of bilateral hand and feet swelling and morning stiffness.  Previous treatments include methotrexate stopped due to elevated transaminases.  She was treated with Humira for about 6 months but with increasing injection site reaction and pain did not tolerate this well.  She was switched to Enbrel continued this for several months but also stopped due to the same problem of local injection site reactions and pain.  She is not sure whether the medicine was ever effective due to early discontinuation.  She started [place] in 2016 she felt greatly improved her symptoms continue this medicine until 2021 she does question whether there was loss of efficacy.  After [name] she started hydroxychloroquine has been on this since October 2021 does not feel this medication has been highly effective for her joint pain.  Currently she complains of daily pain worst in her bilateral knees and feet.  She describes the pain as sharp but states her foot pain feels more like a pressure sensation.  The knee pain is exacerbated climbing stairs.  She notices stiffness pain and redness over her MCP joints does not describe discrete swelling in this area lately.  She is not sure whether there is knee or  ankle swelling related to her arthritis body habitus and swelling and redness she gets in the feet from standing during the day.  Currently she takes Celebrex once daily in the mornings and takes Tylenol usually twice a day for her ongoing joint pain.  She previously took oral diclofenac that she found helpful but was switched due to concern of long-term NSAID exposure.  She takes hydroxychloroquine 400 mg/day.  DMARD Hx MTX d/c LFTs Humira d/c injection site reactions Enbrel d/c injection site reactions  (old formulation) [name] ?loss of efficacy  Labs  02/2014 HBV/HCV neg RF neg CCP neg   Review of Systems  Constitutional:  Positive for fatigue.  HENT:  Positive for mouth dryness.   Eyes:  Negative for dryness.  Respiratory:  Negative for shortness of breath.   Cardiovascular:  Negative for swelling in legs/feet.  Gastrointestinal:  Negative for constipation.  Endocrine: Positive for heat intolerance, excessive thirst and increased urination.  Genitourinary:  Negative for difficulty urinating.  Musculoskeletal:  Positive for joint pain, gait problem, joint pain, muscle weakness and morning stiffness.  Skin:  Negative for rash.  Allergic/Immunologic: Negative for susceptible to infections.  Neurological:  Positive for weakness.  Hematological:  Negative for bruising/bleeding tendency.  Psychiatric/Behavioral:  Negative for sleep disturbance.    PMFS History:  Patient Active Problem List   Diagnosis Date Noted   High risk medication use 07/29/2020   Bilateral knee pain 07/29/2020   Attention deficit hyperactivity disorder (ADHD) 09/28/2017   Insomnia, unspecified 09/28/2017   Acute meniscal tear of knee, left, subsequent encounter 02/10/2017   Left knee pain 09/01/2016   Obesity 01/14/2016   Rheumatoid arthritis ([place]) 01/14/2016   Hypothyroidism due to acquired atrophy of thyroid 01/14/2016   Osteopenia 09/15/2015   Bipolar 1 disorder ([place]) 08/21/2015   Gastroesophageal reflux disease without esophagitis 08/21/2015   Migraine 08/21/2015    Past Medical History:  Diagnosis Date   Bipolar 1 disorder ([place])    Chronic headache    Depression    Gallstone    Hypertension    IBS (irritable bowel syndrome)    Obesity    Thyroid disease    Ulcer     Family History  Problem Relation Age of Onset   Hypertension Mother    Heart attack Father    Healthy Sister    Healthy Sister    Healthy Sister    Healthy Brother    Healthy Son    Rheum arthritis Paternal Uncle     Colon cancer Neg Hx    Esophageal cancer Neg Hx    Colon polyps Neg Hx    Rectal cancer Neg Hx    Stomach cancer Neg Hx    Past Surgical History:  Procedure Laterality Date   ABDOMINAL HYSTERECTOMY     APPENDECTOMY     CHOLECYSTECTOMY     COLONOSCOPY  09/07/2018   CYSTECTOMY     dermoid   ESOPHAGOGASTRODUODENOSCOPY     about 20 years ago to check for ulcers   KNEE ARTHROSCOPY Left 2019   MYOMECTOMY     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 52   UPPER GASTROINTESTINAL ENDOSCOPY     Social History   Social History Narrative   Not on file   Immunization History  Administered Date(s) Administered   Influenza,inj,Quad PF,6+ Mos 01/14/2016, 09/13/2017   Influenza-Unspecified 10/31/2016, 08/06/2018   Pneumococcal Conjugate-13 03/24/2014   Pneumococcal Polysaccharide-23 05/26/2014   Pneumococcal-Unspecified 05/26/2014   Tdap 11/10/2017     Objective:  Vital Signs: BP 121/79 (BP Location: Left Arm, Patient Position: Sitting, Cuff Size: Large)    Pulse 87    Resp 16    Ht '5\' 9"'  (1.753 m)    Wt (!) 317 lb (143.8 kg)    BMI 46.81 kg/m    Physical Exam Constitutional:      Appearance: She is obese.  Eyes:     Conjunctiva/sclera: Conjunctivae normal.  Cardiovascular:     Rate and Rhythm: Normal rate and regular rhythm.  Pulmonary:     Effort: Pulmonary effort is normal.     Breath sounds: Normal breath sounds.  Musculoskeletal:     Right lower leg: No edema.     Left lower leg: No edema.  Skin:    General: Skin is warm and dry.     Findings: No rash.  Neurological:     General: No focal deficit present.     Mental Status: She is alert.  Psychiatric:        Mood and Affect: Mood normal.     Musculoskeletal Exam:  Shoulders full ROM no tenderness or swelling Elbows full ROM no tenderness or swelling Wrists full ROM no tenderness or swelling Fingers full ROM no tenderness or swelling Knees full ROM, mild crepitus on right knee, mild valgus deformity, no palpable effusions, some  medial joint line tenderness and with full flexion Ankles full ROM no tenderness or swelling MTPs full ROM no tenderness or swelling   CDAI Exam: CDAI Score: 7  Patient Global: 30 mm; Provider Global: 20 mm Swollen: 0 ; Tender: 2  Joint Exam 01/19/2021      Right  Left  Knee   Tender   Tender     Investigation: No additional findings.  Imaging: No results found.  Recent Labs: Lab Results  Component Value Date   WBC 5.4 10/20/2020   HGB 14.9 10/20/2020   PLT 254 10/20/2020   NA 141 10/20/2020   K 4.2 10/20/2020   CL 104 10/20/2020   CO2 29 10/20/2020   GLUCOSE 79 10/20/2020   BUN 12 10/20/2020   CREATININE 0.77 10/20/2020   BILITOT 0.5 10/20/2020   ALKPHOS 73 01/30/2020   AST 25 10/20/2020   ALT 28 10/20/2020   PROT 7.3 10/20/2020   ALBUMIN 4.2 01/30/2020   CALCIUM 10.0 10/20/2020   GFRAA >60 04/23/2019   QFTBGOLDPLUS NEGATIVE 07/29/2020    Speciality Comments: No specialty comments available.  Procedures:  No procedures performed Allergies: Coconut oil, Codeine, Sulfa antibiotics, and Trazodone and nefazodone   Assessment / Plan:     Visit Diagnoses: Rheumatoid arthritis with negative rheumatoid factor, involving unspecified site [facility])  Joint inflammation looks pretty well controlled on exam not having a lot of swelling or prolonged morning stiffness. Checking ESR for disease activity monitoring today as well. Plan to continue current Enbrel 50 mg [place] weekly and HCQ 400 mg daily. Provided information to review about rinvoq as a treatment option no changes made today.  High risk medication use  Needs CBC and CMP for Enbrel medication monitoring. She is interested about possible rinvoq treatment we need baseline lipid profile as well. She has upcoming PCP appointment on Thursday provided written orders to consolidate lab draws.  Chronic pain of both knees  Leg stiffness and pain appears more related to her osteoarthritis than RA disease activity right now.  Continuing diclofenac PRN and she is going to start working on an exercise program with personal trainer I recommended proximal leg strengthening and gait  stability as a focus.  Orders: No orders of the defined types were placed in this encounter.  No orders of the defined types were placed in this encounter.    Follow-Up Instructions: Return in about 3 months (around 04/19/2021) for RA on ENB/HCQ/NSAIDs f/u 1mo.   CCollier Salina MD  Note - This record has been created using [employer]  Chart creation errors have been sought, but may not always  have been located. Such creation errors do not reflect on  the standard of medical care.

## 2021-01-19 ENCOUNTER — Ambulatory Visit (INDEPENDENT_AMBULATORY_CARE_PROVIDER_SITE_OTHER): Payer: BC Managed Care – PPO | Admitting: Internal Medicine

## 2021-01-19 ENCOUNTER — Encounter: Payer: Self-pay | Admitting: Internal Medicine

## 2021-01-19 ENCOUNTER — Other Ambulatory Visit: Payer: Self-pay

## 2021-01-19 VITALS — BP 121/79 | HR 87 | Resp 16 | Ht 69.0 in | Wt 317.0 lb

## 2021-01-19 DIAGNOSIS — M25561 Pain in right knee: Secondary | ICD-10-CM | POA: Diagnosis not present

## 2021-01-19 DIAGNOSIS — M0600 Rheumatoid arthritis without rheumatoid factor, unspecified site: Secondary | ICD-10-CM

## 2021-01-19 DIAGNOSIS — Z79899 Other long term (current) drug therapy: Secondary | ICD-10-CM

## 2021-01-19 DIAGNOSIS — M25562 Pain in left knee: Secondary | ICD-10-CM

## 2021-01-19 DIAGNOSIS — G8929 Other chronic pain: Secondary | ICD-10-CM

## 2021-01-19 NOTE — Patient Instructions (Addendum)
Upadacitinib Extended-Release Tablets What is this medication? UPADACITINIB (ue PAD a SYE ti nib) treats rheumatoid arthritis, psoriatic arthritis, atopic dermatitis, and ankylosing spondylitits. It is also used to treat ulcerative colitis. It works on the immune system. It belongs to a group of medications called JAK inhibitors. This medicine may be used for other purposes; ask your health care provider or pharmacist if you have questions. COMMON BRAND NAME(S): RINVOQ What should I tell my care team before I take this medication? They need to know if you have any of these conditions: Blood clots Cancer Diabetes (high blood sugar) Heart disease High blood pressure High cholesterol Immune system problems Infection, especially a viral infection such as chickenpox, cold sores, or herpes Infection such as tuberculosis (TB) or other bacterial, fungal or viral infection Kidney disease Liver disease Low blood counts (white cells, platelets, or red blood cells) Lung or breathing disease (asthma, COPD) Organ transplant Recent or upcoming vaccine Skin cancer/melanoma Smoke tobacco cigarettes Stomach or intestine problems Stroke An unusual or allergic reaction to upadacitinib, other medications, foods, dyes or preservatives Pregnant or trying to get pregnant Breast-feeding How should I use this medication? Take this medication by mouth with water. Take it as directed on the prescription label at the same time every day. Do not cut, crush, or chew this medication. Swallow the tablets whole. You can take it with or without food. If it upsets your stomach, take it with food. Keep taking it unless your care team tells you to stop. A special MedGuide will be given to you by the pharmacist with each prescription and refill. Be sure to read this information carefully each time. Talk to your care team about the use of this medication in children. While it may be prescribed for children as young as 12  years for select conditions, precautions do apply. Overdosage: If you think you have taken too much of this medicine contact a poison control center or emergency room at once. NOTE: This medicine is only for you. Do not share this medicine with others. What if I miss a dose? If you miss a dose, take it as soon as you can. If it is almost time for your next dose, take only that dose. Do not take double or extra doses. What may interact with this medication? Do not take this medication with any of the following medications: Baricitinib Tofacitinib This medication may also interact with the following medications: Azathioprine Certain antivirals for hepatitis or HIV Biologic medications such as abatacept, adalimumab, anakinra, certolizumab, etanercept, golimumab, infliximab, rituximab, secukinumab, tocilizumab, ustekinumab Certain medications for fungal infections like ketoconazole, itraconazole, posaconazole, or voriconazole Certain medications for seizures like carbamazepine, phenobarbital, phenytoin Clarithromycin Cyclosporine Live vaccines Medications that lower your chance of fighting infection Mifepristone Nefazodone Rifampin Supplements, such as St. John's wort This list may not describe all possible interactions. Give your health care provider a list of all the medicines, herbs, non-prescription drugs, or dietary supplements you use. Also tell them if you smoke, drink alcohol, or use illegal drugs. Some items may interact with your medicine. What should I watch for while using this medication? Visit your care team for regular checks on your progress. Tell your care team if your symptoms do not start to get better or if they get worse. You may need blood work done while you are taking this medication. Avoid taking medications that contain aspirin, acetaminophen, ibuprofen, naproxen, or ketoprofen unless instructed by your care team. These medications may hide a fever. This medication  may increase your risk of getting an infection. Call your care team for advice if you get a fever, chills, sore throat, or other symptoms of a cold or flu. Do not treat yourself. Try to avoid being around people who are sick. Do not become pregnant while taking this medication. Women should inform their care team if they wish to become pregnant or think they might be pregnant. Women should use a form of birth control while taking this medication. Women will also need to take it for 4 weeks after stopping this medication. There is potential for serious harm to an unborn child. Talk to your care team for more information. Do not breast-feed an infant while taking this medication or for 6 days after stopping it. Talk to your care team about your risk of cancer. You may be more at risk for certain types of cancer if you take this medication. Talk to your care team about your risk of skin cancer. You may be more at risk for skin cancer if you take this medication. This medication can make you more sensitive to the sun. Keep out of the sun. If you cannot avoid being in the sun, wear protective clothing and sunscreen. Do not use sun lamps or tanning beds/booths. Tell your care team right away if you have any change in your eyesight. What side effects may I notice from receiving this medication? Side effects that you should report to your doctor or health care professional as soon as possible: Allergic reactions--skin rash, itching, hives, swelling of the face, lips, tongue, or throat Blood clot--pain, swelling, or warmth in the leg, shortness of breath, chest pain Change in vision Heart attack--pain or tightness in the chest, shoulders, arms, or jaw, nausea, shortness of breath, cold or clammy skin, feeling faint or lightheaded Infection--fever, chills, cough, sore throat, wounds that don't heal, pain or trouble when passing urine, general feeling of discomfort or being unwell Liver injury--right upper belly  pain, loss of appetite, nausea, light-colored stool, dark yellow or brown urine, yellowing skin or eyes, unusual weakness or fatigue Low red blood cell count--unusually weakness or fatigue, dizziness, headache, trouble breathing Stomach pain Stroke--sudden numbness or weakness of the face, arm, or leg, trouble speaking, confusion, trouble walking, loss of balance or coordination, dizziness, severe headache, change in vision Side effects that usually do not require medical attention (report these to your doctor or health care professional if they continue or are bothersome): Runny or stuffy nose Nausea This list may not describe all possible side effects. Call your doctor for medical advice about side effects. You may report side effects to FDA at 1-800-FDA-1088. Where should I keep my medication? Keep out of the reach of children and pets. Store at room temperature between 20 and 25 degrees C (68 and 77 degrees F). Get rid of any unused medication after the expiration date. To get rid of medications that are no longer needed or have expired: Take the medication to a medication take-back program. Check with your pharmacy or law enforcement to find a location. If you cannot return the medication, check the label or package insert to see if the medication should be thrown out in the garbage or flushed down the toilet. If you are not sure, ask your care team. If it is safe to put it in the trash, empty the medication out of the container. Mix the medication with cat litter, dirt, coffee grounds, or other unwanted substance. Seal the mixture in a bag or container.  Put it in the trash. NOTE: This sheet is a summary. It may not cover all possible information. If you have questions about this medicine, talk to your doctor, pharmacist, or health care provider.  2022 Elsevier/Gold Standard (2020-05-06 00:00:00)

## 2021-01-20 ENCOUNTER — Encounter: Payer: Self-pay | Admitting: Family Medicine

## 2021-01-20 ENCOUNTER — Telehealth: Payer: Self-pay | Admitting: Family Medicine

## 2021-01-20 DIAGNOSIS — M069 Rheumatoid arthritis, unspecified: Secondary | ICD-10-CM

## 2021-01-20 DIAGNOSIS — E034 Atrophy of thyroid (acquired): Secondary | ICD-10-CM

## 2021-01-20 DIAGNOSIS — Z5181 Encounter for therapeutic drug level monitoring: Secondary | ICD-10-CM

## 2021-01-20 NOTE — Telephone Encounter (Signed)
Patient states she is a hard stick and would like to get her rheumatologist labs done here while she gets her TSH labs drawn. She states Dr. Harrell Gave order ESR (sedrate mo6.00) , CBC with diff, CMP with gsr,  and a Lipid panel. Please advise.

## 2021-01-20 NOTE — Telephone Encounter (Signed)
Okay for pt to have these labs ordered?

## 2021-01-25 ENCOUNTER — Telehealth: Payer: Self-pay | Admitting: Physician Assistant

## 2021-01-25 ENCOUNTER — Other Ambulatory Visit (INDEPENDENT_AMBULATORY_CARE_PROVIDER_SITE_OTHER): Payer: BC Managed Care – PPO

## 2021-01-25 DIAGNOSIS — M069 Rheumatoid arthritis, unspecified: Secondary | ICD-10-CM | POA: Diagnosis not present

## 2021-01-25 DIAGNOSIS — E034 Atrophy of thyroid (acquired): Secondary | ICD-10-CM | POA: Diagnosis not present

## 2021-01-25 DIAGNOSIS — Z5181 Encounter for therapeutic drug level monitoring: Secondary | ICD-10-CM | POA: Diagnosis not present

## 2021-01-25 LAB — COMPREHENSIVE METABOLIC PANEL
ALT: 27 U/L (ref 0–35)
AST: 23 U/L (ref 0–37)
Albumin: 4.2 g/dL (ref 3.5–5.2)
Alkaline Phosphatase: 99 U/L (ref 39–117)
BUN: 10 mg/dL (ref 6–23)
CO2: 24 mEq/L (ref 19–32)
Calcium: 9.7 mg/dL (ref 8.4–10.5)
Chloride: 107 mEq/L (ref 96–112)
Creatinine, Ser: 0.69 mg/dL (ref 0.40–1.20)
GFR: 101.91 mL/min (ref >60.00)
Glucose, Bld: 92 mg/dL (ref 70–99)
Potassium: 4.4 mEq/L (ref 3.5–5.1)
Sodium: 143 mEq/L (ref 135–145)
Total Bilirubin: 0.5 mg/dL (ref 0.2–1.2)
Total Protein: 6.9 g/dL (ref 6.0–8.3)

## 2021-01-25 LAB — LIPID PANEL
Cholesterol: 197 mg/dL (ref 0–200)
HDL: 52 mg/dL (ref >39.00)
LDL Cholesterol: 120 mg/dL — ABNORMAL HIGH (ref 0–99)
NonHDL: 144.88
Total CHOL/HDL Ratio: 4
Triglycerides: 124 mg/dL (ref 0.0–149.0)
VLDL: 24.8 mg/dL (ref 0.0–40.0)

## 2021-01-25 LAB — CBC WITH DIFFERENTIAL/PLATELET
Basophils Absolute: 0.1 10*3/uL (ref 0.0–0.1)
Basophils Relative: 1 % (ref 0.0–3.0)
Eosinophils Absolute: 0.2 10*3/uL (ref 0.0–0.7)
Eosinophils Relative: 4.3 % (ref 0.0–5.0)
HCT: 40.2 % (ref 36.0–46.0)
Hemoglobin: 13.2 g/dL (ref 12.0–15.0)
Lymphocytes Relative: 26.5 % (ref 12.0–46.0)
Lymphs Abs: 1.5 10*3/uL (ref 0.7–4.0)
MCHC: 32.8 g/dL (ref 30.0–36.0)
MCV: 95.1 fl (ref 78.0–100.0)
Monocytes Absolute: 0.4 10*3/uL (ref 0.1–1.0)
Monocytes Relative: 6.5 % (ref 3.0–12.0)
Neutro Abs: 3.4 10*3/uL (ref 1.4–7.7)
Neutrophils Relative %: 61.7 % (ref 43.0–77.0)
Platelets: 223 10*3/uL (ref 150.0–400.0)
RBC: 4.22 Mil/uL (ref 3.87–5.11)
RDW: 13.4 % (ref 11.5–15.5)
WBC: 5.5 10*3/uL (ref 4.0–10.5)

## 2021-01-25 LAB — SEDIMENTATION RATE: Sed Rate: 15 mm/hr (ref 0–20)

## 2021-01-25 NOTE — Telephone Encounter (Signed)
Pt is requesting Adderall refill to be be made with 10mg  and 20mg  pills instead of the 30mg  pills.  That is what is available at her pharmacy, [facility].  Next appt 4/3

## 2021-01-25 NOTE — Addendum Note (Signed)
Addended by: Rosita Kea on: 01/25/2021 02:18 PM   Modules accepted: Orders

## 2021-01-26 ENCOUNTER — Encounter: Payer: Self-pay | Admitting: Family Medicine

## 2021-01-26 ENCOUNTER — Encounter: Payer: Self-pay | Admitting: Internal Medicine

## 2021-01-26 LAB — TSH: TSH: 0.17 u[IU]/mL — ABNORMAL LOW (ref 0.35–5.50)

## 2021-01-26 NOTE — Telephone Encounter (Signed)
She is only able to find brand and insurance won't cover and she can't afford .She is requesting a generic med to replace for the time being

## 2021-01-26 NOTE — Telephone Encounter (Signed)
Patient returned called to office regarding prior message. She says that the phamacy she normally uses is completel out and she would like prescription sent somewhere else. Pls rtc if needed [phone]. Appt 4/3. Pharmacy [facility] [address] [place], [place]

## 2021-01-26 NOTE — Telephone Encounter (Signed)
[facility] only [facility] brand name and insurance may not cover.She is going to check cost then call back

## 2021-01-27 ENCOUNTER — Other Ambulatory Visit: Payer: Self-pay | Admitting: Physician Assistant

## 2021-01-27 MED ORDER — LISDEXAMFETAMINE DIMESYLATE 40 MG PO CAPS: 40.0000 mg | ORAL_CAPSULE | ORAL | 0 refills | Status: DC

## 2021-01-27 NOTE — Telephone Encounter (Signed)
Molli Knock, I sent Vyvanse to the [facility] in [place].

## 2021-01-27 NOTE — Telephone Encounter (Signed)
Please tell her about Adzenys. It's the same thing as Adderall but dosing is different d/t the way it is compounded.  It is a brand name drug but [facility] in [place] has a relationship with the drug company and with the co-pay card, it is not very expensive.  I am not exactly sure how much it cost though.  That pharmacy delivers.  There is another pharmacy in [place] that also has a relationship with another company.  They also deliver.  If she would like to go this route, let me know and I will send the prescription into 1 of those pharmacies. Another option would be to change to a drug called Mydayis, it is in the same family as Adderall. Or change from amphetamines to a methylphenidate drug, I would recommend either Concerta or Metadate CD. Thank you.

## 2021-01-27 NOTE — Telephone Encounter (Signed)
Pt stated she would like to try vyvanse,I informed her it was a brand name med but she found the co pay card online and thinks it is affordable

## 2021-01-27 NOTE — Telephone Encounter (Signed)
Pt informed

## 2021-01-29 ENCOUNTER — Telehealth: Payer: Self-pay

## 2021-01-29 NOTE — Telephone Encounter (Signed)
PA for vyvanse 40 mg capsules #30 has been approved by [employer] through covermymeds. Effective dates: 01/29/21-01/28/22

## 2021-02-01 ENCOUNTER — Other Ambulatory Visit (HOSPITAL_COMMUNITY): Payer: Self-pay

## 2021-02-01 ENCOUNTER — Telehealth: Payer: Self-pay | Admitting: Pharmacist

## 2021-02-01 DIAGNOSIS — M0600 Rheumatoid arthritis without rheumatoid factor, unspecified site: Secondary | ICD-10-CM

## 2021-02-01 DIAGNOSIS — Z79899 Other long term (current) drug therapy: Secondary | ICD-10-CM

## 2021-02-01 NOTE — Telephone Encounter (Addendum)
Submitted a Prior Authorization request to [facility] for RINVOQ via CoverMyMeds. Will update once we receive a response.  Key: [id number]  Chesley Mires, PharmD, MPH, BCPS Clinical Pharmacist (Rheumatology and Pulmonology)  ----- Message from Fuller Plan, MD sent at 02/01/2021  8:16 AM EST ----- Regarding: Rinvoq Ms. Muldrow is interested in trying to switch treatment from Enbrel to Rinvoq for her rheumatoid arthritis due to incomplete symptom response. Also previously tried methotrexate stopped for LFT changes, humira and xeljanz stopped due to what sounds like secondary loss of efficacy, before joining our clinic. We have her baseline labs already. She has cardiovascular risk factors from obesity and high cholesterol although both are going in the right direction lately and we discussed potential risks. Thanks.

## 2021-02-02 ENCOUNTER — Other Ambulatory Visit (HOSPITAL_COMMUNITY): Payer: Self-pay

## 2021-02-02 NOTE — Telephone Encounter (Signed)
Patient added to [employer] portal- Copay card info pending [employer] [facility]

## 2021-02-02 NOTE — Telephone Encounter (Signed)
Received notification from [employer] regarding a prior authorization for [facility]. Authorization has been APPROVED from 02/01/21 to 01/31/22.   Unable to run test claim - prior authorization has not been fully entered into claims system. Will have to re-process later  Chesley Mires, PharmD, MPH, BCPS Clinical Pharmacist (Rheumatology and Pulmonology)

## 2021-02-03 ENCOUNTER — Other Ambulatory Visit (HOSPITAL_COMMUNITY): Payer: Self-pay

## 2021-02-03 NOTE — Telephone Encounter (Signed)
Ran test claim, patient has $100 copay and is able to fill [facility].

## 2021-02-03 NOTE — Telephone Encounter (Signed)
Rinvoq copay card-  Rx ID- [id number] Rx GROUP- [id number] Rx BIN- [id number] PCN- [id number]

## 2021-02-04 ENCOUNTER — Other Ambulatory Visit (HOSPITAL_COMMUNITY): Payer: Self-pay

## 2021-02-04 MED ORDER — RINVOQ 15 MG PO TB24
15.0000 mg | ORAL_TABLET | Freq: Every day | ORAL | 0 refills | Status: DC
  Filled 2021-02-04: qty 30, 30d supply, fill #0
  Filled 2021-03-01 – 2021-03-04 (×2): qty 30, 30d supply, fill #1
  Filled 2021-03-23: qty 30, 30d supply, fill #2

## 2021-02-04 NOTE — Telephone Encounter (Signed)
ATC patient to review Rinvoq new start. Phone went straight to VM. Left VM requesting return call to discuss  Knox Saliva, PharmD, MPH, BCPS Clinical Pharmacist (Rheumatology and Pulmonology)

## 2021-02-04 NOTE — Telephone Encounter (Signed)
[facility]. Patient takes Enbrel on Saturdays and has been advised to hold Rinvoq dose that is due this upcoming Saturday until she receives Rinvoq.  Patient aware that Ocie DoyneBrighton M will reach out to collect any copay and set up first shipment of Rinvoq.  Enbrel discontinued from medication list today. Future lab orders for CBC, CMP and lipid panel placed today.  Chesley Miresevki Tashaun Obey, PharmD, MPH, BCPS Clinical Pharmacist (Rheumatology and Pulmonology)

## 2021-02-05 ENCOUNTER — Other Ambulatory Visit (HOSPITAL_COMMUNITY): Payer: Self-pay

## 2021-02-05 NOTE — Telephone Encounter (Signed)
Left VM with patient and advised that she may pick up Rinvoq from [facility] and advised of copay of $5 on VM.  Chesley Mires, PharmD, MPH, BCPS Clinical Pharmacist (Rheumatology and Pulmonology)

## 2021-02-08 ENCOUNTER — Other Ambulatory Visit (HOSPITAL_COMMUNITY): Payer: Self-pay

## 2021-02-08 ENCOUNTER — Telehealth: Payer: Self-pay | Admitting: Internal Medicine

## 2021-02-08 NOTE — Telephone Encounter (Signed)
Returned call to [facility] to discontinue Enbrel. Patient switched to Rinvoq. Nothing further needed.  Chesley Mires, PharmD, MPH, BCPS Clinical Pharmacist (Rheumatology and Pulmonology)

## 2021-02-08 NOTE — Telephone Encounter (Signed)
[facility]/05/21  Chesley Miresevki Yekaterina Escutia, PharmD, MPH, BCPS Clinical Pharmacist (Rheumatology and Pulmonology)

## 2021-02-08 NOTE — Telephone Encounter (Signed)
John from [employer] left a voicemail regarding the [employer]. He states that he wants to know if a PA has been started for the patients Embrel yet.  Ph# [phone] Fax# [phone]

## 2021-02-13 ENCOUNTER — Other Ambulatory Visit: Payer: Self-pay | Admitting: Family Medicine

## 2021-02-13 ENCOUNTER — Other Ambulatory Visit: Payer: Self-pay | Admitting: Physician Assistant

## 2021-02-13 DIAGNOSIS — I10 Essential (primary) hypertension: Secondary | ICD-10-CM

## 2021-02-22 ENCOUNTER — Telehealth: Payer: Self-pay | Admitting: Physician Assistant

## 2021-02-22 NOTE — Telephone Encounter (Signed)
Hey I sent this twice because I wasn't sure I sent it the first time. She knows it is not due til this weekend, but will you look at it again later this week?

## 2021-02-22 NOTE — Telephone Encounter (Signed)
Pt is requesting next RF of Vyvanse, due 2/25. Please send to : [facility] - [place], [place] - [address]

## 2021-02-22 NOTE — Telephone Encounter (Signed)
Postponed, too early to RF.

## 2021-02-22 NOTE — Telephone Encounter (Signed)
Please send in RF of Vyvanse for pt to :  [facility] [address], [place] - [id number]  [address] [place] [phone]

## 2021-02-24 ENCOUNTER — Telehealth: Payer: Self-pay | Admitting: Family Medicine

## 2021-02-24 ENCOUNTER — Other Ambulatory Visit: Payer: Self-pay

## 2021-02-24 DIAGNOSIS — Z1239 Encounter for other screening for malignant neoplasm of breast: Secondary | ICD-10-CM

## 2021-02-24 NOTE — Telephone Encounter (Signed)
Order has been signed, pt aware.

## 2021-02-24 NOTE — Telephone Encounter (Signed)
Patient states she needs a mammogram sent to the imaging downstairs since [facility]. Please advise.

## 2021-02-25 ENCOUNTER — Ambulatory Visit (HOSPITAL_BASED_OUTPATIENT_CLINIC_OR_DEPARTMENT_OTHER)
Admission: RE | Admit: 2021-02-25 | Discharge: 2021-02-25 | Disposition: A | Discharge status: Home / Self Care | Payer: BC Managed Care – PPO | Source: Ambulatory Visit | Attending: Family Medicine | Admitting: Family Medicine

## 2021-02-25 ENCOUNTER — Other Ambulatory Visit: Payer: Self-pay

## 2021-02-25 ENCOUNTER — Encounter (HOSPITAL_BASED_OUTPATIENT_CLINIC_OR_DEPARTMENT_OTHER): Payer: Self-pay

## 2021-02-25 ENCOUNTER — Telehealth: Payer: Self-pay | Admitting: Physician Assistant

## 2021-02-25 DIAGNOSIS — Z1231 Encounter for screening mammogram for malignant neoplasm of breast: Secondary | ICD-10-CM | POA: Diagnosis not present

## 2021-02-25 DIAGNOSIS — Z1239 Encounter for other screening for malignant neoplasm of breast: Secondary | ICD-10-CM

## 2021-02-25 NOTE — Telephone Encounter (Signed)
I will send tomorrow a.m. Already in my folder to do.

## 2021-02-25 NOTE — Telephone Encounter (Signed)
Can we please send her refill of Adderall by 1/24 since it will be due? See note on 2/20.

## 2021-02-26 ENCOUNTER — Other Ambulatory Visit: Payer: Self-pay | Admitting: Family Medicine

## 2021-02-26 ENCOUNTER — Other Ambulatory Visit: Payer: Self-pay

## 2021-02-26 MED ORDER — LISDEXAMFETAMINE DIMESYLATE 40 MG PO CAPS: 40.0000 mg | ORAL_CAPSULE | ORAL | 0 refills | Status: DC

## 2021-02-26 NOTE — Telephone Encounter (Signed)
Pended for 2/25

## 2021-02-26 NOTE — Telephone Encounter (Signed)
Patient is now taking Vyvanse, not Adderall. Called patient to verify. Vyvanse pended for 2/25.

## 2021-02-28 ENCOUNTER — Encounter: Payer: Self-pay | Admitting: Family Medicine

## 2021-02-28 DIAGNOSIS — E034 Atrophy of thyroid (acquired): Secondary | ICD-10-CM

## 2021-03-01 ENCOUNTER — Other Ambulatory Visit (HOSPITAL_COMMUNITY): Payer: Self-pay

## 2021-03-01 MED ORDER — LEVOTHYROXINE SODIUM 150 MCG PO TABS
150.0000 ug | ORAL_TABLET | Freq: Every day | ORAL | 3 refills | Status: DC
  Filled 2021-05-10 – 2021-05-11 (×3): qty 30, 30d supply, fill #0
  Filled 2021-06-16: qty 30, 30d supply, fill #1
  Filled 2021-07-23: qty 30, 30d supply, fill #2
  Filled 2021-08-23: qty 30, 30d supply, fill #3

## 2021-03-04 ENCOUNTER — Telehealth: Payer: Self-pay

## 2021-03-04 ENCOUNTER — Other Ambulatory Visit (HOSPITAL_COMMUNITY): Payer: Self-pay

## 2021-03-04 NOTE — Telephone Encounter (Signed)
Received notification from [facility] that PA is required for Rinvoq, despite one just being obtained on 02/02/21. ? ?Submitted an URGENT Prior Authorization request [facility] via CoverMyMeds. Will update once we receive a response. ? ? ?Key: [id number] ?

## 2021-03-09 ENCOUNTER — Encounter: Payer: Self-pay | Admitting: Family Medicine

## 2021-03-09 ENCOUNTER — Telehealth (INDEPENDENT_AMBULATORY_CARE_PROVIDER_SITE_OTHER): Payer: BC Managed Care – PPO | Admitting: Family Medicine

## 2021-03-09 VITALS — Ht 69.0 in | Wt 315.0 lb

## 2021-03-09 DIAGNOSIS — J0140 Acute pansinusitis, unspecified: Secondary | ICD-10-CM | POA: Diagnosis not present

## 2021-03-09 MED ORDER — FLUTICASONE PROPIONATE 50 MCG/ACT NA SUSP: 2.0000 | Freq: Every day | NASAL | 6 refills | Status: DC

## 2021-03-09 MED ORDER — AMOXICILLIN-POT CLAVULANATE 875-125 MG PO TABS: 1.0000 | ORAL_TABLET | Freq: Two times a day (BID) | ORAL | 0 refills | Status: DC

## 2021-03-09 NOTE — Progress Notes (Signed)
MyChart Video Visit    Virtual Visit via Video Note   This visit type was conducted due to national recommendations for restrictions regarding the COVID-19 Pandemic (e.g. social distancing) in an effort to limit this patient's exposure and mitigate transmission in our community. This patient is at least at moderate risk for complications without adequate follow up. This format is felt to be most appropriate for this patient at this time. Physical exam was limited by quality of the video and audio technology used for the visit. Grace Butler was able to get the patient set up on a video visit.  Patient location: in her car alone Patient and provider in visit Provider location: Office  I discussed the limitations of evaluation and management by telemedicine and the availability of in person appointments. The patient expressed understanding and agreed to proceed.  Visit Date: 03/09/2021  Today's healthcare provider: Ann Held, DO     Subjective:    Patient ID: Grace Butler, female    DOB: 1970-10-15, 51 y.o.   MRN: [id number]  Chief Complaint  Patient presents with   Sinus Problem    Pt states sxs started Friday. Pt states having cough, no fever or body aches.     HPI Patient is in today for possible sinus infection.  Symptoms since Friday.   She is taking nyquil and benadryl.    + cough , sinus pressure / headache   Past Medical History:  Diagnosis Date   Bipolar 1 disorder (HCC)    Chronic headache    Depression    Gallstone    Hypertension    IBS (irritable bowel syndrome)    Obesity    Thyroid disease    Ulcer     Past Surgical History:  Procedure Laterality Date   ABDOMINAL HYSTERECTOMY     APPENDECTOMY     CHOLECYSTECTOMY     COLONOSCOPY  09/07/2018   CYSTECTOMY     dermoid   ESOPHAGOGASTRODUODENOSCOPY     about 20 years ago to check for ulcers   KNEE ARTHROSCOPY Left 2019   MYOMECTOMY     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 68   UPPER  GASTROINTESTINAL ENDOSCOPY      Family History  Problem Relation Age of Onset   Hypertension Mother    Heart attack Father    Healthy Sister    Healthy Sister    Healthy Sister    Healthy Brother    Healthy Son    Rheum arthritis Paternal Uncle    Colon cancer Neg Hx    Esophageal cancer Neg Hx    Colon polyps Neg Hx    Rectal cancer Neg Hx    Stomach cancer Neg Hx     Social History   Socioeconomic History   Marital status: Married    Spouse name: Not on file   Number of children: 1   Years of education: Not on file   Highest education level: Not on file  Occupational History   Occupation: Staffing  Tobacco Use   Smoking status: Former    Packs/day: 0.50    Years: 20.00    Pack years: 10.00    Types: Cigarettes    Quit date: 01/16/2014    Years since quitting: 7.1   Smokeless tobacco: Never  Vaping Use   Vaping Use: Some days   Substances: Nicotine  Substance and Sexual Activity   Alcohol use: Yes    Comment: Rarely   Drug  use: No   Sexual activity: Yes    Partners: Female  Other Topics Concern   Not on file  Social History Narrative   Not on file   Social Determinants of Health   Financial Resource Strain: Not on file  Food Insecurity: Not on file  Transportation Needs: Not on file  Physical Activity: Not on file  Stress: Not on file  Social Connections: Not on file  Intimate Partner Violence: Not on file    Outpatient Medications Prior to Visit  Medication Sig Dispense Refill   amitriptyline (ELAVIL) 50 MG tablet Take 1 tablet (50 mg total) by mouth every evening. 90 tablet 1   amphetamine-dextroamphetamine (ADDERALL XR) 30 MG 24 hr capsule TAKE 1 CAPSULE BY MOUTH EVERY DAY WITH BREAKFAST 30 capsule 0   amphetamine-dextroamphetamine (ADDERALL XR) 30 MG 24 hr capsule Take 1 capsule (30 mg total) by mouth daily. 30 capsule 0   amphetamine-dextroamphetamine (ADDERALL XR) 30 MG 24 hr capsule Take 1 capsule (30 mg total) by mouth daily. 30 capsule 0    amphetamine-dextroamphetamine (ADDERALL XR) 30 MG 24 hr capsule TAKE 1 CAPSULE BY MOUTH EVERY DAY WITH BREAKFAST 30 capsule 0   ARIPiprazole (ABILIFY) 15 MG tablet TAKE 1 TABLET BY MOUTH EACH DAY 90 tablet 1   Ascorbic Acid (VITAMIN C) 100 MG tablet Take 100 mg by mouth daily.     calcium citrate-vitamin D (CITRACAL+D) 315-200 MG-UNIT tablet Take 1 tablet by mouth 2 (two) times daily.     cholecalciferol (VITAMIN D) 1000 UNITS tablet Take 1,000 Units by mouth daily.     diclofenac (VOLTAREN) 25 MG EC tablet Take 1 tablet (25 mg total) by mouth 2 (two) times daily as needed. 60 tablet 5   gabapentin (NEURONTIN) 300 MG capsule TAKE 1 CAPSULE BY MOUTH EVERY MORNING, 1CAPSULE AT NOON, AND 2 CAPSULES ABOUT THREE HOURS PRIOR TO BEDTIME 120 capsule 0   hydroxychloroquine (PLAQUENIL) 200 MG tablet Take 1 tablet (200 mg total) by mouth 2 (two) times daily. 60 tablet 5   Insulin Pen Needle (PEN NEEDLES) 31G X 6 MM MISC 1 each by Does not apply route daily. 50 each prn   levothyroxine (SYNTHROID) 150 MCG tablet Take 1 tablet (150 mcg total) by mouth daily before breakfast. 30 tablet 5   lisdexamfetamine (VYVANSE) 40 MG capsule Take 1 capsule (40 mg total) by mouth every morning. 30 capsule 0   lisinopril (ZESTRIL) 10 MG tablet TAKE 1 TABLET BY MOUTH EVERY DAY 90 tablet 0   omeprazole (PRILOSEC) 40 MG capsule TAKE 1 CAPSULE BY MOUTH 2 TIMES DAILY FOR ACID REFLUX 180 capsule 3   QUEtiapine (SEROQUEL) 50 MG tablet Take 3 tablets (150 mg total) by mouth at bedtime. 270 tablet 1   Specialty Vitamins Products (MAGNESIUM, AMINO ACID CHELATE,) 133 MG tablet Take 1 tablet by mouth 2 (two) times daily.     Upadacitinib ER (RINVOQ) 15 MG TB24 Take 1 tablet (15 mg) by mouth daily. 90 tablet 0   alendronate (FOSAMAX) 70 MG tablet Take 70 mg by mouth. (Patient not taking: Reported on 07/29/2020)     Alum & Mag Hydroxide-Simeth (GI COCKTAIL) SUSP suspension Take 30 mLs by mouth 2 (two) times daily. Shake well. (Patient not  taking: Reported on 10/07/2020) 300 mL 0   amoxicillin-clavulanate (AUGMENTIN) 875-125 MG tablet Take 1 tablet by mouth 2 (two) times daily. (Patient not taking: Reported on 01/19/2021) 14 tablet 0   fluticasone (FLONASE) 50 MCG/ACT nasal spray Place 2 sprays into  both nostrils daily. 16 g 0   promethazine-dextromethorphan (PROMETHAZINE-DM) 6.25-15 MG/5ML syrup Take 5 mLs by mouth 4 (four) times daily as needed for cough. (Patient not taking: Reported on 01/19/2021) 118 mL 0   rizatriptan (MAXALT-MLT) 10 MG disintegrating tablet TAKE 1 TABLET BY MOUTH AT ONSET OF HEADACHE. MAY REPEAT ONCE IN 2 HOURS IF NEEDED (Patient not taking: Reported on 01/19/2021) 12 tablet 2   Semaglutide-Weight Management (WEGOVY) 0.5 MG/0.5ML SOAJ Inject 0.5 mg into the skin once a week. (Patient not taking: Reported on 07/29/2020) 2 mL 0   No facility-administered medications prior to visit.    Allergies  Allergen Reactions   Coconut Oil Anaphylaxis   Codeine    Sulfa Antibiotics Hives   Trazodone And Nefazodone     Vivid dreams    Review of Systems  Constitutional:  Negative for fever and malaise/fatigue.  HENT:  Positive for congestion and sinus pain.   Eyes:  Negative for blurred vision.  Respiratory:  Positive for cough and sputum production. Negative for shortness of breath and wheezing.   Cardiovascular:  Negative for chest pain, palpitations and leg swelling.  Gastrointestinal:  Negative for abdominal pain, blood in stool and nausea.  Genitourinary:  Negative for dysuria and frequency.  Musculoskeletal:  Negative for falls.  Skin:  Negative for rash.  Neurological:  Negative for dizziness, loss of consciousness and headaches.  Endo/Heme/Allergies:  Negative for environmental allergies.  Psychiatric/Behavioral:  Negative for depression. The patient is not nervous/anxious.       Objective:    Physical Exam Vitals and nursing note reviewed.  Constitutional:      Appearance: She is not diaphoretic.   HENT:     Nose:     Right Sinus: Maxillary sinus tenderness and frontal sinus tenderness present.     Left Sinus: Maxillary sinus tenderness and frontal sinus tenderness present.  Pulmonary:     Effort: Pulmonary effort is normal.  Musculoskeletal:     Cervical back: Normal range of motion.  Neurological:     Mental Status: She is alert and oriented to person, place, and time.    Ht '5\' 9"'  (1.753 m)    Wt (!) 315 lb (142.9 kg)    BMI 46.52 kg/m  Wt Readings from Last 3 Encounters:  03/09/21 (!) 315 lb (142.9 kg)  01/19/21 (!) 317 lb (143.8 kg)  10/20/20 (!) 327 lb (148.3 kg)    Diabetic Foot Exam - Simple   No data filed    Lab Results  Component Value Date   WBC 5.5 01/25/2021   HGB 13.2 01/25/2021   HCT 40.2 01/25/2021   PLT 223.0 01/25/2021   GLUCOSE 92 01/25/2021   CHOL 197 01/25/2021   TRIG 124.0 01/25/2021   HDL 52.00 01/25/2021   LDLCALC 120 (H) 01/25/2021   ALT 27 01/25/2021   AST 23 01/25/2021   NA 143 01/25/2021   K 4.4 01/25/2021   CL 107 01/25/2021   CREATININE 0.69 01/25/2021   BUN 10 01/25/2021   CO2 24 01/25/2021   TSH 0.17 (L) 01/25/2021   INR 1.0 04/23/2019   HGBA1C 5.5 01/14/2016    Lab Results  Component Value Date   TSH 0.17 (L) 01/25/2021   Lab Results  Component Value Date   WBC 5.5 01/25/2021   HGB 13.2 01/25/2021   HCT 40.2 01/25/2021   MCV 95.1 01/25/2021   PLT 223.0 01/25/2021   Lab Results  Component Value Date   NA 143 01/25/2021   K  4.4 01/25/2021   CO2 24 01/25/2021   GLUCOSE 92 01/25/2021   BUN 10 01/25/2021   CREATININE 0.69 01/25/2021   BILITOT 0.5 01/25/2021   ALKPHOS 99 01/25/2021   AST 23 01/25/2021   ALT 27 01/25/2021   PROT 6.9 01/25/2021   ALBUMIN 4.2 01/25/2021   CALCIUM 9.7 01/25/2021   ANIONGAP 9 08/06/2020   EGFR 95 10/20/2020   GFR 101.91 01/25/2021   Lab Results  Component Value Date   CHOL 197 01/25/2021   Lab Results  Component Value Date   HDL 52.00 01/25/2021   Lab Results   Component Value Date   LDLCALC 120 (H) 01/25/2021   Lab Results  Component Value Date   TRIG 124.0 01/25/2021   Lab Results  Component Value Date   CHOLHDL 4 01/25/2021   Lab Results  Component Value Date   HGBA1C 5.5 01/14/2016       Assessment & Plan:   Problem List Items Addressed This Visit       Unprioritized   Acute non-recurrent pansinusitis - Primary    augmentin per orders flonase sent in F/u prn       Relevant Medications   amoxicillin-clavulanate (AUGMENTIN) 875-125 MG tablet   fluticasone (FLONASE) 50 MCG/ACT nasal spray    I have discontinued [facility]'s alendronate, gi cocktail, rizatriptan, Wegovy, promethazine-dextromethorphan, fluticasone, and amoxicillin-clavulanate. I am also having her start on amoxicillin-clavulanate and fluticasone. Additionally, I am having her maintain her cholecalciferol, magnesium (amino acid chelate), calcium citrate-vitamin D, vitamin C, Pen Needles, amphetamine-dextroamphetamine, QUEtiapine, amitriptyline, ARIPiprazole, amphetamine-dextroamphetamine, amphetamine-dextroamphetamine, amphetamine-dextroamphetamine, hydroxychloroquine, diclofenac, Rinvoq, lisinopril, gabapentin, omeprazole, lisdexamfetamine, and levothyroxine.  Meds ordered this encounter  Medications   amoxicillin-clavulanate (AUGMENTIN) 875-125 MG tablet    Sig: Take 1 tablet by mouth 2 (two) times daily.    Dispense:  20 tablet    Refill:  0   fluticasone (FLONASE) 50 MCG/ACT nasal spray    Sig: Place 2 sprays into both nostrils daily.    Dispense:  16 g    Refill:  6    I discussed the assessment and treatment plan with the patient. The patient was provided an opportunity to ask questions and all were answered. The patient agreed with the plan and demonstrated an understanding of the instructions.   The patient was advised to call back or seek an in-person evaluation if the symptoms worsen or if the condition fails to improve as  anticipated.    Ann Held, DO [place] [facility] [phone] (phone) [phone] (fax)  [place]

## 2021-03-09 NOTE — Assessment & Plan Note (Signed)
augmentin per orders ?flonase sent in ?F/u prn  ?

## 2021-03-17 NOTE — Telephone Encounter (Signed)
Received notification from [facility] regarding a prior authorization for [facility]. Authorization has been APPROVED from 03/04/2021 to 03/03/2022.   ? ?Authorization # [id number] ?

## 2021-03-23 ENCOUNTER — Other Ambulatory Visit (HOSPITAL_COMMUNITY): Payer: Self-pay

## 2021-03-29 ENCOUNTER — Other Ambulatory Visit (HOSPITAL_COMMUNITY): Payer: Self-pay

## 2021-03-29 ENCOUNTER — Other Ambulatory Visit: Payer: Self-pay | Admitting: Physician Assistant

## 2021-03-29 ENCOUNTER — Telehealth: Payer: Self-pay | Admitting: Physician Assistant

## 2021-03-29 MED ORDER — LISDEXAMFETAMINE DIMESYLATE 40 MG PO CAPS: 40.0000 mg | ORAL_CAPSULE | ORAL | 0 refills | Status: DC

## 2021-03-29 NOTE — Telephone Encounter (Signed)
Prescription was sent

## 2021-03-29 NOTE — Telephone Encounter (Signed)
Pt called at 3:54 pm and said that she needs a refill on her vyvanse 40 mg . Next appt is in april ?

## 2021-03-30 ENCOUNTER — Telehealth: Payer: Self-pay | Admitting: Physician Assistant

## 2021-03-30 NOTE — Telephone Encounter (Signed)
Patient called in stating that her insurance is requiring a PA for her Vyvanse. Pls rtc to discuss [phone].  ?

## 2021-03-31 NOTE — Telephone Encounter (Signed)
PA initiated 03/31/21 ?

## 2021-04-05 ENCOUNTER — Encounter: Payer: Self-pay | Admitting: Physician Assistant

## 2021-04-05 ENCOUNTER — Telehealth (INDEPENDENT_AMBULATORY_CARE_PROVIDER_SITE_OTHER): Payer: BC Managed Care – PPO | Admitting: Physician Assistant

## 2021-04-05 DIAGNOSIS — G43909 Migraine, unspecified, not intractable, without status migrainosus: Secondary | ICD-10-CM

## 2021-04-05 DIAGNOSIS — G2581 Restless legs syndrome: Secondary | ICD-10-CM

## 2021-04-05 DIAGNOSIS — F319 Bipolar disorder, unspecified: Secondary | ICD-10-CM | POA: Diagnosis not present

## 2021-04-05 DIAGNOSIS — G4700 Insomnia, unspecified: Secondary | ICD-10-CM

## 2021-04-05 DIAGNOSIS — F411 Generalized anxiety disorder: Secondary | ICD-10-CM

## 2021-04-05 DIAGNOSIS — F902 Attention-deficit hyperactivity disorder, combined type: Secondary | ICD-10-CM | POA: Diagnosis not present

## 2021-04-05 MED ORDER — LISDEXAMFETAMINE DIMESYLATE 40 MG PO CAPS: 40.0000 mg | ORAL_CAPSULE | ORAL | 0 refills | Status: DC

## 2021-04-05 MED ORDER — AMITRIPTYLINE HCL 50 MG PO TABS
50.0000 mg | ORAL_TABLET | Freq: Every evening | ORAL | 1 refills | Status: DC
  Filled 2021-08-18: qty 90, 90d supply, fill #0

## 2021-04-05 MED ORDER — QUETIAPINE FUMARATE 50 MG PO TABS: 150.0000 mg | ORAL_TABLET | Freq: Every day | ORAL | 1 refills | Status: DC

## 2021-04-05 NOTE — Progress Notes (Signed)
[facility] Check ? ?Patient ID: Grace Butler,  ?MRN: [id number] ? ?PCP: Copland, Gwenlyn Found, MD ? ?Date of Evaluation: 04/05/2021 ?Time spent:30 minutes ? ?Chief Complaint:  ?Chief Complaint   ?ADHD ?  ?Virtual Visit via Telehealth ? ?I connected with patient by a video enabled telemedicine application with their informed consent, and verified patient privacy and that I am speaking with the correct person using two identifiers.  I am private, in my office and the patient is at work. ? ?I discussed the limitations, risks, security and privacy concerns of performing an evaluation and management service by video and the availability of in person appointments. I also discussed with the patient that there may be a patient responsible charge related to this service. The patient expressed understanding and agreed to proceed. ?  ?I discussed the assessment and treatment plan with the patient. The patient was provided an opportunity to ask questions and all were answered. The patient agreed with the plan and demonstrated an understanding of the instructions. ?  ?The patient was advised to call back or seek an in-person evaluation if the symptoms worsen or if the condition fails to improve as anticipated. ? ?I provided 30 minutes of non-face-to-face time during this encounter. ? ? ?HISTORY/CURRENT STATUS: ?HPI for 50-month med check. ? ?She is doing well as far as her mental health medications go.  We had to switch Adderall to Vyvanse because Adderall was not available.  The Vyvanse does work.  States that attention is good without easy distractibility.  Able to focus on things and finish tasks to completion.  She is still enjoying her new job in [employer], she has been there for around a year now. ? ?Her wife has been diagnosed with colorectal cancer, they just found out a few weeks ago.  They will be seeing the surgeon soon.  That has been stressful but she feels like everything will be okay.   ? ?Patient denies loss of  interest in usual activities and is able to enjoy things.  Denies decreased energy or motivation.  Appetite has not changed.  No extreme sadness, tearfulness, or feelings of hopelessness. She is sleeping fine.  If she does not take the Seroquel she cannot sleep.  The amitriptyline helps prevent migraines as well as helps her sleep.  Denies suicidal or homicidal thoughts. ? ?Patient denies increased energy with decreased need for sleep, no increased talkativeness, no racing thoughts, no impulsivity or risky behaviors, no increased spending, no increased libido, no grandiosity, no increased irritability or anger, and no hallucinations. ? ?Denies dizziness, syncope, seizures, numbness, tingling, tremor, tics, unsteady gait, slurred speech, confusion. Denies muscle or joint pain, stiffness, or dystonia. ? ?Individual Medical History/ Review of Systems: Changes? :No   RA is stable. ? ?Allergies: Coconut oil, Codeine, Sulfa antibiotics, and Trazodone and nefazodone ? ?Current Medications:  ?Current Outpatient Medications:  ?  ARIPiprazole (ABILIFY) 15 MG tablet, TAKE 1 TABLET BY MOUTH EACH DAY, Disp: 90 tablet, Rfl: 1 ?  Ascorbic Acid (VITAMIN C) 100 MG tablet, Take 100 mg by mouth daily., Disp: , Rfl:  ?  calcium citrate-vitamin D (CITRACAL+D) 315-200 MG-UNIT tablet, Take 1 tablet by mouth 2 (two) times daily., Disp: , Rfl:  ?  cholecalciferol (VITAMIN D) 1000 UNITS tablet, Take 1,000 Units by mouth daily., Disp: , Rfl:  ?  diclofenac (VOLTAREN) 25 MG EC tablet, Take 1 tablet (25 mg total) by mouth 2 (two) times daily as needed., Disp: 60 tablet, Rfl: 5 ?  fluticasone (FLONASE) 50 MCG/ACT nasal spray, Place 2 sprays into both nostrils daily., Disp: 16 g, Rfl: 6 ?  gabapentin (NEURONTIN) 300 MG capsule, TAKE 1 CAPSULE BY MOUTH EVERY MORNING, 1CAPSULE AT NOON, AND 2 CAPSULES ABOUT THREE HOURS PRIOR TO BEDTIME (Patient taking differently: 300 mg at bedtime.), Disp: 120 capsule, Rfl: 0 ?  hydroxychloroquine (PLAQUENIL) 200  MG tablet, Take 1 tablet (200 mg total) by mouth 2 (two) times daily., Disp: 60 tablet, Rfl: 5 ?  Insulin Pen Needle (PEN NEEDLES) 31G X 6 MM MISC, 1 each by Does not apply route daily., Disp: 50 each, Rfl: prn ?  levothyroxine (SYNTHROID) 150 MCG tablet, Take 1 tablet (150 mcg total) by mouth daily before breakfast., Disp: 30 tablet, Rfl: 5 ?  [START ON 05/02/2021] lisdexamfetamine (VYVANSE) 40 MG capsule, Take 1 capsule (40 mg total) by mouth every morning., Disp: 30 capsule, Rfl: 0 ?  [START ON 05/31/2021] lisdexamfetamine (VYVANSE) 40 MG capsule, Take 1 capsule (40 mg total) by mouth every morning., Disp: 30 capsule, Rfl: 0 ?  [START ON 06/30/2021] lisdexamfetamine (VYVANSE) 40 MG capsule, Take 1 capsule (40 mg total) by mouth every morning., Disp: 30 capsule, Rfl: 0 ?  lisinopril (ZESTRIL) 10 MG tablet, TAKE 1 TABLET BY MOUTH EVERY DAY, Disp: 90 tablet, Rfl: 0 ?  omeprazole (PRILOSEC) 40 MG capsule, TAKE 1 CAPSULE BY MOUTH 2 TIMES DAILY FOR ACID REFLUX, Disp: 180 capsule, Rfl: 3 ?  Specialty Vitamins Products (MAGNESIUM, AMINO ACID CHELATE,) 133 MG tablet, Take 1 tablet by mouth 2 (two) times daily., Disp: , Rfl:  ?  Upadacitinib ER (RINVOQ) 15 MG TB24, Take 1 tablet (15 mg) by mouth daily., Disp: 90 tablet, Rfl: 0 ?  amitriptyline (ELAVIL) 50 MG tablet, Take 1 tablet (50 mg total) by mouth every evening., Disp: 90 tablet, Rfl: 1 ?  QUEtiapine (SEROQUEL) 50 MG tablet, Take 3 tablets (150 mg total) by mouth at bedtime., Disp: 270 tablet, Rfl: 1 ?Medication Side Effects: none ? ?Family Medical/ Social History: Changes?  Wife has colorectal cancer, just dx a few weeks ago.  ? ? ?MENTAL HEALTH EXAM: ? ?There were no vitals taken for this visit.There is no height or weight on file to calculate BMI.  ?General Appearance: Casual and Well Groomed  ?Eye Contact:  Good  ?Speech:  Clear and Coherent and Normal Rate  ?Volume:  Normal  ?Mood:  Euthymic  ?Affect:  Congruent  ?Thought Process:  Goal Directed and Descriptions of  Associations: Intact  ?Orientation:  Full (Time, Place, and Person)  ?Thought Content: Logical   ?Suicidal Thoughts:  No  ?Homicidal Thoughts:  No  ?Memory:  WNL  ?Judgement:  Good  ?Insight:  Good  ?Psychomotor Activity:  Normal  ?Concentration:  Concentration: Good and Attention Span: Good  ?Recall:  Good  ?Fund of Knowledge: Good  ?Language: Good  ?Assets:  Desire for Improvement  ?ADL's:  Intact  ?Cognition: WNL  ?Prognosis:  Good  ? ?Labs 01/25/2021 reviewed.  PCP follows. ? ?DIAGNOSES:  ?  ICD-10-CM   ?1. Bipolar I disorder ([place])  F31.9   ?  ?2. Migraine without status migrainosus, not intractable, unspecified migraine type  G43.909 amitriptyline (ELAVIL) 50 MG tablet  ?  ?3. Attention deficit hyperactivity disorder (ADHD), combined type  F90.2   ?  ?4. Restless leg syndrome  G25.81   ?  ?5. Generalized anxiety disorder  F41.1   ?  ?6. Insomnia, unspecified type  G47.00   ?  ? ? ? ?Receiving  Psychotherapy: No  ? ? ?RECOMMENDATIONS:  ?PDMP was reviewed. Last Vyvanse filled 04/02/2021.  Last Adderall 12/29/2020. ?I provided 30 minutes of non-face-to-face time during this encounter, including time spent before and after the visit in records review, medical decision making, counseling pertinent to today's visit, and charting.  ?I am glad to see her doing well.  No changes in treatment are necessary. ?I am sorry to hear about her wife's diagnosis and hope that the surgery and treatment go well. ? ?Continue Vyvanse 40 mg, 1 p.o. every morning.  (This was changed from Adderall due to the [employer].) ?Continue amitriptyline 50 mg, 1 p.o. nightly. ?Continue Abilify 15 mg, 1 p.o. every morning. ?Continue gabapentin 300 mg 1 p.o. qhs. ?Continue Seroquel 50 mg, 3 p.o. nightly, for sleep. ?Return in 6 months. ? ?Donnal Moat, PA-C  ?

## 2021-04-19 ENCOUNTER — Ambulatory Visit: Payer: BC Managed Care – PPO | Admitting: Internal Medicine

## 2021-04-20 ENCOUNTER — Other Ambulatory Visit: Payer: Self-pay | Admitting: Internal Medicine

## 2021-04-20 ENCOUNTER — Other Ambulatory Visit (HOSPITAL_COMMUNITY): Payer: Self-pay

## 2021-04-20 DIAGNOSIS — M0600 Rheumatoid arthritis without rheumatoid factor, unspecified site: Secondary | ICD-10-CM

## 2021-04-20 DIAGNOSIS — Z79899 Other long term (current) drug therapy: Secondary | ICD-10-CM

## 2021-04-20 NOTE — Telephone Encounter (Signed)
Next Visit: 05/11/2021 ? ?Last Visit: 01/19/2021 ? ?Last Fill: 02/04/2021 ? 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 ?Current Dose per office note 01/19/2021: not discussed ? ?Labs: 01/25/2021 WNL ? ?TB Gold: 07/29/2020 Neg   ? ?Okay to refill Rinvoq?  ?

## 2021-04-21 ENCOUNTER — Other Ambulatory Visit (HOSPITAL_COMMUNITY): Payer: Self-pay

## 2021-04-21 MED ORDER — RINVOQ 15 MG PO TB24
15.0000 mg | ORAL_TABLET | Freq: Every day | ORAL | 0 refills | Status: DC
  Filled 2021-04-21: qty 30, 30d supply, fill #0
  Filled 2021-05-19: qty 30, 30d supply, fill #1
  Filled 2021-06-15: qty 30, 30d supply, fill #2

## 2021-04-26 ENCOUNTER — Other Ambulatory Visit (HOSPITAL_COMMUNITY): Payer: Self-pay

## 2021-04-28 ENCOUNTER — Other Ambulatory Visit (HOSPITAL_COMMUNITY): Payer: Self-pay

## 2021-04-28 ENCOUNTER — Other Ambulatory Visit: Payer: Self-pay | Admitting: Internal Medicine

## 2021-04-28 DIAGNOSIS — M0600 Rheumatoid arthritis without rheumatoid factor, unspecified site: Secondary | ICD-10-CM

## 2021-04-28 MED ORDER — HYDROXYCHLOROQUINE SULFATE 200 MG PO TABS
200.0000 mg | ORAL_TABLET | Freq: Two times a day (BID) | ORAL | 5 refills | Status: DC
  Filled 2021-04-28 – 2021-04-29 (×3): qty 60, 30d supply, fill #0
  Filled 2021-05-28: qty 60, 30d supply, fill #1
  Filled 2021-06-28: qty 60, 30d supply, fill #2
  Filled 2021-07-26: qty 60, 30d supply, fill #3
  Filled 2021-08-27: qty 60, 30d supply, fill #4
  Filled 2021-09-23: qty 60, 30d supply, fill #5

## 2021-04-28 MED ORDER — DICLOFENAC SODIUM 25 MG PO TBEC
25.0000 mg | DELAYED_RELEASE_TABLET | Freq: Two times a day (BID) | ORAL | 5 refills | Status: DC | PRN
  Filled 2021-04-28 – 2021-04-29 (×3): qty 60, 30d supply, fill #0
  Filled 2021-05-28 – 2021-06-01 (×2): qty 60, 30d supply, fill #1
  Filled 2021-06-28: qty 60, 30d supply, fill #2
  Filled 2021-07-26: qty 60, 30d supply, fill #3
  Filled 2021-08-27 – 2021-08-31 (×2): qty 60, 30d supply, fill #4
  Filled 2021-10-07: qty 60, 30d supply, fill #5

## 2021-04-28 MED FILL — Omeprazole Cap Delayed Release 40 MG: ORAL | 90 days supply | Qty: 180 | Fill #0 | Status: AC

## 2021-04-28 NOTE — Telephone Encounter (Signed)
Patient called the office requesting a refill of Plaquenil 200 mg and Diclofenac 25 mg to be sent to the [facility] outpatient pharmacy. ?

## 2021-04-28 NOTE — Telephone Encounter (Signed)
Next Visit: 05/11/2021 ?  ?Last Visit: 01/19/2021 ?  ?Last Fill: 10/20/2020  ? 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  ?Current Dose per office note 01/19/2021: HCQ 400 mg daily ? ?Labs: 01/25/2021 WNL ? ?PLQ Eye Exam: not on file.  ? ?Patient states she had her eye exam a few months ago. She states she will call the eye doctor and have that faxed to our office.  ? ?Okay to refill PLQ and Diclofenac?  ?

## 2021-04-29 ENCOUNTER — Other Ambulatory Visit (HOSPITAL_COMMUNITY): Payer: Self-pay

## 2021-04-30 ENCOUNTER — Other Ambulatory Visit (HOSPITAL_COMMUNITY): Payer: Self-pay

## 2021-05-03 ENCOUNTER — Other Ambulatory Visit (HOSPITAL_COMMUNITY): Payer: Self-pay

## 2021-05-04 ENCOUNTER — Encounter: Payer: Self-pay | Admitting: Family Medicine

## 2021-05-05 ENCOUNTER — Ambulatory Visit: Payer: BC Managed Care – PPO | Admitting: Family Medicine

## 2021-05-05 ENCOUNTER — Telehealth: Payer: Self-pay

## 2021-05-05 NOTE — Progress Notes (Addendum)
Therapist, music at [facility] ?[address], [address] ?[place], [place] [address] ?336 [phone] ?Fax [phone] ? ?Date:  05/06/2021  ? ?Name:  Grace Butler   DOB:  1970-12-06   MRN:  [id number] ? ?PCP:  Pershing Skidmore, Gay Filler, MD  ? ? ?Chief Complaint: Palpitations (Pt feels like they might be stress induced. Pt says last night was the worst that she felt. ) ? ? ?History of Present Illness: ? ?Grace Butler is a 51 y.o. very pleasant female patient who presents with the following: ? ?History of RA, mood disorder, ADD, hypothyroisidm  ?Patient seen today with concern of heart palpitations-she sent [place] the following MyChart message ? ?Good afternoon ma?am. I wanted to ask, is it normal for your heart to race like it?s reving up? The last few nights, I was awakened with my heart racing.  It?s probably just stress with Grace Butler going through chemo but I wanted to be sure. It?s doing it now which is what prompted me to reach out to you.  Any suggestions? I e stopped caffeine after around 10:00am. I?m drinking clear liquids all day. Not sure what?s going on.  ?Last visit with myself June of last year-more recently we noted her TSH was out of range, we adjusted her levothyroxine ? ?She has noted episodic heart racing for several months-perhaps 3 months ?It may occur a couple of times a week, can last a variable amount of time ?No pattern- can happen anytime day or night ?Non exertional  ?No CP or SOB, no loss of consciousness ?Her partner has cancer so she is under more stress ?She cut down on caffeine ?Medication changes - also changed from Embrel to Rinvoq 3-4 months ago, has worked great for her RA symptoms  ? ?Her adderal was changed to vyvanse about a month ago due to drug shortage-however palpitations predates this change ? ?Her TSH was low at last check, we made levothyroxine adjustment.  Can repeat today ?Patient Active Problem List  ? Diagnosis Date Noted  ? Acute non-recurrent pansinusitis 03/09/2021   ? High risk medication use 07/29/2020  ? Bilateral knee pain 07/29/2020  ? Attention deficit hyperactivity disorder (ADHD) 09/28/2017  ? Insomnia, unspecified 09/28/2017  ? Acute meniscal tear of knee, left, subsequent encounter 02/10/2017  ? Left knee pain 09/01/2016  ? Obesity 01/14/2016  ? Rheumatoid arthritis ([place]) 01/14/2016  ? Hypothyroidism due to acquired atrophy of thyroid 01/14/2016  ? Osteopenia 09/15/2015  ? Bipolar 1 disorder ([place]) 08/21/2015  ? Gastroesophageal reflux disease without esophagitis 08/21/2015  ? Migraine 08/21/2015  ? ? ?Past Medical History:  ?Diagnosis Date  ? Bipolar 1 disorder ([place])   ? Chronic headache   ? Depression   ? Gallstone   ? Hypertension   ? IBS (irritable bowel syndrome)   ? Obesity   ? Thyroid disease   ? Ulcer   ? ? ?Past Surgical History:  ?Procedure Laterality Date  ? ABDOMINAL HYSTERECTOMY    ? APPENDECTOMY    ? CHOLECYSTECTOMY    ? COLONOSCOPY  09/07/2018  ? CYSTECTOMY    ? dermoid  ? ESOPHAGOGASTRODUODENOSCOPY    ? about 20 years ago to check for ulcers  ? KNEE ARTHROSCOPY Left 2019  ? MYOMECTOMY    ? TERATOMA EXCISION    ? age 81  ? UPPER GASTROINTESTINAL ENDOSCOPY    ? ? ?Social History  ? ?Tobacco Use  ? Smoking status: Former  ?  Packs/day: 0.50  ?  Years: 20.00  ?  Pack years: 10.00  ?  Types: Cigarettes  ?  Quit date: 01/16/2014  ?  Years since quitting: 7.3  ? Smokeless tobacco: Never  ?Vaping Use  ? Vaping Use: Some days  ? Substances: Nicotine  ?Substance Use Topics  ? Alcohol use: Yes  ?  Comment: Rarely  ? Drug use: No  ? ? ?Family History  ?Problem Relation Age of Onset  ? Hypertension Mother   ? Heart attack Father   ? Healthy Sister   ? Healthy Sister   ? Healthy Sister   ? Healthy Brother   ? Healthy Son   ? Rheum arthritis Paternal Uncle   ? Colon cancer Neg Hx   ? Esophageal cancer Neg Hx   ? Colon polyps Neg Hx   ? Rectal cancer Neg Hx   ? Stomach cancer Neg Hx   ? ? ?Allergies  ?Allergen Reactions  ? Coconut Oil Anaphylaxis  ? Codeine   ? Sulfa  Antibiotics Hives  ? Trazodone And Nefazodone   ?  Vivid dreams  ? ? ?Medication list has been reviewed and updated. ? ?Current Outpatient Medications on File Prior to Visit  ?Medication Sig Dispense Refill  ? amitriptyline (ELAVIL) 50 MG tablet Take 1 tablet (50 mg total) by mouth every evening. 90 tablet 1  ? ARIPiprazole (ABILIFY) 15 MG tablet TAKE 1 TABLET BY MOUTH EACH DAY 90 tablet 1  ? Ascorbic Acid (VITAMIN C) 100 MG tablet Take 100 mg by mouth daily.    ? calcium citrate-vitamin D (CITRACAL+D) 315-200 MG-UNIT tablet Take 1 tablet by mouth 2 (two) times daily.    ? cholecalciferol (VITAMIN D) 1000 UNITS tablet Take 1,000 Units by mouth daily.    ? diclofenac (VOLTAREN) 25 MG EC tablet Take 1 tablet (25 mg total) by mouth 2 (two) times daily as needed. 60 tablet 5  ? fluticasone (FLONASE) 50 MCG/ACT nasal spray Place 2 sprays into both nostrils daily. 16 g 6  ? gabapentin (NEURONTIN) 300 MG capsule TAKE 1 CAPSULE BY MOUTH EVERY MORNING, 1CAPSULE AT NOON, AND 2 CAPSULES ABOUT THREE HOURS PRIOR TO BEDTIME (Patient taking differently: 300 mg at bedtime.) 120 capsule 0  ? hydroxychloroquine (PLAQUENIL) 200 MG tablet Take 1 tablet by mouth 2 times daily. 60 tablet 5  ? Insulin Pen Needle (PEN NEEDLES) 31G X 6 MM MISC 1 each by Does not apply route daily. 50 each prn  ? levothyroxine (SYNTHROID) 150 MCG tablet Take 1 tablet (150 mcg total) by mouth daily before breakfast. 30 tablet 5  ? lisdexamfetamine (VYVANSE) 40 MG capsule Take 1 capsule (40 mg total) by mouth every morning. 30 capsule 0  ? [START ON 05/31/2021] lisdexamfetamine (VYVANSE) 40 MG capsule Take 1 capsule (40 mg total) by mouth every morning. 30 capsule 0  ? [START ON 06/30/2021] lisdexamfetamine (VYVANSE) 40 MG capsule Take 1 capsule (40 mg total) by mouth every morning. 30 capsule 0  ? lisinopril (ZESTRIL) 10 MG tablet TAKE 1 TABLET BY MOUTH EVERY DAY 90 tablet 0  ? omeprazole (PRILOSEC) 40 MG capsule TAKE 1 CAPSULE BY MOUTH 2 TIMES DAILY FOR ACID  REFLUX 180 capsule 3  ? QUEtiapine (SEROQUEL) 50 MG tablet Take 3 tablets (150 mg total) by mouth at bedtime. 270 tablet 1  ? Specialty Vitamins Products (MAGNESIUM, AMINO ACID CHELATE,) 133 MG tablet Take 1 tablet by mouth 2 (two) times daily.    ? Upadacitinib ER (RINVOQ) 15 MG TB24 Take 1 tablet (15  mg) by mouth daily. 90 tablet 0  ? ?No current facility-administered medications on file prior to visit.  ? ? ?Review of Systems: ? ?As per HPI- otherwise negative. ? ? ?Physical Examination: ?Vitals:  ? 05/06/21 1128  ?BP: 110/78  ?Pulse: 73  ?Resp: 18  ?Temp: 97.8 ?F (36.6 ?C)  ?SpO2: 98%  ? ?Vitals:  ? 05/06/21 1128  ?Weight: (!) 323 lb 12.8 oz (146.9 kg)  ?Height: 5\' 9"  (1.753 m)  ? ?Body mass index is 47.82 kg/m?. ?Ideal Body Weight: Weight in (lb) to have BMI = 25: 168.9 ? ?GEN: no acute distress.  Obese, looks well ?HEENT: Atraumatic, Normocephalic.  ?Ears and Nose: No external deformity. ?CV: RRR, No M/G/R. No JVD. No thrill. No extra heart sounds. ?PULM: CTA B, no wheezes, crackles, rhonchi. No retractions. No resp. distress. No accessory muscle use. ?ABD: S, NT, ND, +BS. No rebound. No HSM. ?EXTR: No c/c/e ?PSYCH: Normally interactive. Conversant.  ? ?EKG: Sinus rhythm with low voltage chest leads, RSR prime in V1 ?Compared with ECG from August of last year tracings appear quite similar ?No ST elevation or depression ? ?Assessment and Plan: ?Palpitations - Plan: TSH, EKG 12-Lead, CBC, Basic metabolic panel, LONG TERM MONITOR (3-14 DAYS) ? ?Patient seen today with concern of palpitations, intermittent for about 3 months ?EKG today is noncontributory ?Check TSH, will order Zio patch to get more information about heart rhythm ?She is asked to let me know if any worsening or changes in the meantime ? ?Signed ?Lamar Blinks, MD ? ?Received labs as below, message to pt ?Results for orders placed or performed in visit on 05/06/21  ?TSH  ?Result Value Ref Range  ? TSH 1.74 0.35 - 5.50 uIU/mL  ?CBC  ?Result Value  Ref Range  ? WBC 4.8 4.0 - 10.5 K/uL  ? RBC 4.00 3.87 - 5.11 Mil/uL  ? Platelets 274.0 150.0 - 400.0 K/uL  ? Hemoglobin 12.9 12.0 - 15.0 g/dL  ? HCT 38.8 36.0 - 46.0 %  ? MCV 97.0 78.0 - 100.0 fl  ? MCHC

## 2021-05-05 NOTE — Telephone Encounter (Signed)
FYI: pt sent a mychart message yesterday 05/04/21 stating that she was having heart palpitations. I scheduled her for today at 10 am, but then she canceled and rescheduled for tomorrow 05/07/21. ?

## 2021-05-06 ENCOUNTER — Ambulatory Visit: Payer: BC Managed Care – PPO | Admitting: Family Medicine

## 2021-05-06 ENCOUNTER — Encounter: Payer: Self-pay | Admitting: Family Medicine

## 2021-05-06 VITALS — BP 110/78 | HR 73 | Temp 97.8°F | Resp 18 | Ht 69.0 in | Wt 323.8 lb

## 2021-05-06 DIAGNOSIS — R002 Palpitations: Secondary | ICD-10-CM

## 2021-05-06 LAB — BASIC METABOLIC PANEL
BUN: 10 mg/dL (ref 6–23)
CO2: 27 mEq/L (ref 19–32)
Calcium: 9.4 mg/dL (ref 8.4–10.5)
Chloride: 102 mEq/L (ref 96–112)
Creatinine, Ser: 0.78 mg/dL (ref 0.40–1.20)
GFR: 89.02 mL/min (ref >60.00)
Glucose, Bld: 79 mg/dL (ref 70–99)
Potassium: 4.5 mEq/L (ref 3.5–5.1)
Sodium: 138 mEq/L (ref 135–145)

## 2021-05-06 LAB — CBC
HCT: 38.8 % (ref 36.0–46.0)
Hemoglobin: 12.9 g/dL (ref 12.0–15.0)
MCHC: 33.2 g/dL (ref 30.0–36.0)
MCV: 97 fl (ref 78.0–100.0)
Platelets: 274 10*3/uL (ref 150.0–400.0)
RBC: 4 Mil/uL (ref 3.87–5.11)
RDW: 13.8 % (ref 11.5–15.5)
WBC: 4.8 10*3/uL (ref 4.0–10.5)

## 2021-05-06 LAB — TSH: TSH: 1.74 u[IU]/mL (ref 0.35–5.50)

## 2021-05-06 NOTE — Patient Instructions (Signed)
It was good to see you today- we will get an at home heart monitor for you and I will be in touch with your labs ?If anything changes or gets worse please alert me!   ?

## 2021-05-06 NOTE — Progress Notes (Deleted)
Office Visit Note  Patient: Grace Butler             Date of Birth: [birth year]           MRN: [id number]             PCP: Darreld Mclean, MD Referring: Darreld Mclean, MD Visit Date: 05/11/2021   Subjective:  Rheumatoid Arthritis (Doing good)   History of Present Illness: Grace Butler is a 51 y.o. female here for follow up for seronegative RA on Rinvoq 15mg  po qd, HCQ 400 mg PO daily, and diclofenac 25 mg PRN.   Previous HPI 01/19/2021 Grace Butler is a 51 y.o. female here for follow up for seronegative RA on Enbrel 50 mg [place] weekly, HCQ 400 mg PO daily, and diclofenac 25 mg PRN. She has been doing well most of the time without a RA flare up but for the last few weeks having increased stiffness in bilateral legs, also pain but less of a problem than the stiffness. She fell twice without major injury. She has an appointment to start working with a Physiological scientist on Thursday plans to do resistance training for strengthening and ideally some weight loss. She is interested in information about Rinvoq after hearing about the medication.   Previous HPI 10/20/20 Grace Butler is a 51 y.o. female here for follow up for seronegative rheumatoid arthritis after restarting treatment with Enbrel 50 mg [place] weekly with continued HCQ 400 mg PO daily. Workup at that visit with negative inflammatory serology, xray of left knee does show osteoarthritis changes. She had been feeling pretty well after starting Enbrel although continuing to take diclofenac 25 mg BID regularly. However in the past few weeks she is feeling worse, with several major life and family stressors going on. 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 Her joint pain is worse especially knees and ankles. Not especially stiff though. Recent fall onto right elbow and right  knee without major injury. Had right knee injection with orthopedic surgery for pain and swelling there, with an improvement.   Previous HPI 07/29/20 Grace Butler is a 51 y.o. female here for transition of care for seronegative rheumatoid arthritis previously managed with [facility] rheumatology.  Rheumatoid arthritis was diagnosed in 2013 with development of bilateral hand and feet swelling and morning stiffness.  Previous treatments include methotrexate stopped due to elevated transaminases.  She was treated with Humira for about 6 months but with increasing injection site reaction and pain did not tolerate this well.  She was switched to Enbrel continued this for several months but also stopped due to the same problem of local injection site reactions and pain.  She is not sure whether the medicine was ever effective due to early discontinuation.  She started [place] in 2016 she felt greatly improved her symptoms continue this medicine until 2021 she does question whether there was loss of efficacy.  After [name] she started hydroxychloroquine has been on this since October 2021 does not feel this medication has been highly effective for her joint pain.  Currently she complains of daily pain worst in her bilateral knees and feet.  She describes the pain as sharp but states her foot pain feels more like a pressure sensation.  The knee pain is exacerbated climbing stairs.  She notices stiffness pain and redness over her MCP joints does not describe discrete swelling in this area lately.  She is not sure whether there is knee or ankle swelling related to her arthritis body habitus and swelling and redness she gets in the feet from standing during the day.  Currently she takes Celebrex once daily in the mornings and takes Tylenol usually twice a day for her ongoing joint pain.  She previously took oral diclofenac that she found helpful but was switched due to concern of long-term NSAID exposure.   She takes hydroxychloroquine 400 mg/day.   DMARD Hx MTX d/c LFTs Humira d/c injection site reactions Enbrel d/c injection site reactions (old formulation) [name] ?loss of efficacy   Labs  02/2014 HBV/HCV neg RF neg CCP neg     Review of Systems  Constitutional:  Positive for fatigue.  HENT:  Positive for mouth dryness.   Eyes:  Negative for dryness.  Respiratory:  Negative for shortness of breath.   Cardiovascular:  Negative for swelling in legs/feet.  Gastrointestinal:  Negative for constipation.  Endocrine: Positive for heat intolerance.  Genitourinary:  Negative for difficulty urinating.  Musculoskeletal:  Positive for joint pain, joint pain and morning stiffness.  Skin:  Negative for rash.  Allergic/Immunologic: Negative for susceptible to infections.  Neurological:  Negative for weakness.  Hematological:  Negative for bruising/bleeding tendency.  Psychiatric/Behavioral:  Negative for sleep disturbance.    PMFS History:  Patient Active Problem List   Diagnosis Date Noted   Acute non-recurrent pansinusitis 03/09/2021   High risk medication use 07/29/2020   Bilateral knee pain 07/29/2020   Attention deficit hyperactivity disorder (ADHD) 09/28/2017   Insomnia, unspecified 09/28/2017   Acute meniscal tear of knee, left, subsequent encounter 02/10/2017   Left knee pain 09/01/2016   Obesity 01/14/2016   Rheumatoid arthritis ([place]) 01/14/2016   Hypothyroidism due to acquired atrophy of thyroid 01/14/2016   Osteopenia 09/15/2015   Bipolar 1 disorder ([place]) 08/21/2015   Gastroesophageal reflux disease without esophagitis 08/21/2015   Migraine 08/21/2015    Past Medical History:  Diagnosis Date   Bipolar 1 disorder (HCC)    Chronic headache    Depression    Gallstone    Hypertension    IBS (irritable bowel syndrome)    Obesity    Rheumatoid arteritis ([place])    Thyroid disease    Ulcer     Family History  Problem Relation Age of Onset   Hypertension Mother     Heart attack Father    Healthy Sister    Healthy Sister    Healthy Sister    Healthy Brother    Healthy Son    Rheum arthritis Paternal Uncle    Colon cancer Neg Hx    Esophageal cancer Neg Hx    Colon polyps Neg Hx    Rectal cancer Neg Hx    Stomach cancer Neg Hx    Past Surgical History:  Procedure Laterality Date   ABDOMINAL HYSTERECTOMY     APPENDECTOMY     CHOLECYSTECTOMY     COLONOSCOPY  09/07/2018   CYSTECTOMY     dermoid   ESOPHAGOGASTRODUODENOSCOPY     about 20 years ago to check for ulcers   KNEE ARTHROSCOPY Left 2019   MYOMECTOMY     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 60   UPPER GASTROINTESTINAL ENDOSCOPY     Social History   Social History Narrative   Not on file   Immunization History  Administered Date(s)  Administered   Influenza,inj,Quad PF,6+ Mos 01/14/2016, 09/13/2017   Influenza-Unspecified 10/31/2016, 08/06/2018   Pneumococcal Conjugate-13 03/24/2014   Pneumococcal Polysaccharide-23 05/26/2014   Pneumococcal-Unspecified 05/26/2014   Tdap 11/10/2017     Objective: Vital Signs: BP 133/83 (BP Location: Left Arm, Patient Position: Sitting, Cuff Size: Normal)   Pulse 81   Resp 16   Ht 5\' 9"  (1.753 m)   Wt (!) 322 lb (146.1 kg)   BMI 47.55 kg/m    Physical Exam   Musculoskeletal Exam: ***  CDAI Exam: CDAI Score: -- Patient Global: --; Provider Global: -- Swollen: --; Tender: -- Joint Exam 05/11/2021   No joint exam has been documented for this visit   There is currently no information documented on the homunculus. Go to the Rheumatology activity and complete the homunculus joint exam.  Investigation: No additional findings.  Imaging: No results found.  Recent Labs: Lab Results  Component Value Date   WBC 4.8 05/06/2021   HGB 12.9 05/06/2021   PLT 274.0 05/06/2021   NA 138 05/06/2021   K 4.5 05/06/2021   CL 102 05/06/2021   CO2 27 05/06/2021   GLUCOSE 79 05/06/2021   BUN 10 05/06/2021   CREATININE 0.78 05/06/2021   BILITOT 0.5  01/25/2021   ALKPHOS 99 01/25/2021   AST 23 01/25/2021   ALT 27 01/25/2021   PROT 6.9 01/25/2021   ALBUMIN 4.2 01/25/2021   CALCIUM 9.4 05/06/2021   GFRAA >60 04/23/2019   QFTBGOLDPLUS NEGATIVE 07/29/2020    Speciality Comments: No specialty comments available.  Procedures:  No procedures performed Allergies: Coconut oil, Codeine, Sulfa antibiotics, and Trazodone and nefazodone   Assessment / Plan:     Visit Diagnoses: Rheumatoid arthritis with negative rheumatoid factor, involving unspecified site (HCC)  High risk medication use - Rinvoq 15mg  po qd, HCQ 400 mg PO daily, and diclofenac 25 mg PRN. (previously on enbrel 50mg  subcutaneously)  Chronic pain of both knees - Leg stiffness and pain appears more related to her osteoarthritis than RA disease activity right now.  ***  Orders: No orders of the defined types were placed in this encounter.  No orders of the defined types were placed in this encounter.    Follow-Up Instructions: No follow-ups on file.   Collier Salina, MD  Note - This record has been created using [employer].  Chart creation errors have been sought, but may not always  have been located. Such creation errors do not reflect on  the standard of medical care.

## 2021-05-10 ENCOUNTER — Other Ambulatory Visit (HOSPITAL_COMMUNITY): Payer: Self-pay

## 2021-05-11 ENCOUNTER — Encounter: Payer: Self-pay | Admitting: Internal Medicine

## 2021-05-11 ENCOUNTER — Other Ambulatory Visit (HOSPITAL_COMMUNITY): Payer: Self-pay

## 2021-05-11 ENCOUNTER — Ambulatory Visit: Payer: BC Managed Care – PPO | Admitting: Internal Medicine

## 2021-05-11 VITALS — BP 133/83 | HR 81 | Resp 16 | Ht 69.0 in | Wt 322.0 lb

## 2021-05-11 DIAGNOSIS — G8929 Other chronic pain: Secondary | ICD-10-CM

## 2021-05-11 DIAGNOSIS — Z79899 Other long term (current) drug therapy: Secondary | ICD-10-CM

## 2021-05-11 DIAGNOSIS — M25562 Pain in left knee: Secondary | ICD-10-CM

## 2021-05-11 DIAGNOSIS — Z5321 Procedure and treatment not carried out due to patient leaving prior to being seen by health care provider: Secondary | ICD-10-CM

## 2021-05-11 DIAGNOSIS — M0600 Rheumatoid arthritis without rheumatoid factor, unspecified site: Secondary | ICD-10-CM

## 2021-05-11 DIAGNOSIS — M25561 Pain in right knee: Secondary | ICD-10-CM

## 2021-05-12 ENCOUNTER — Ambulatory Visit: Payer: BC Managed Care – PPO

## 2021-05-12 DIAGNOSIS — R002 Palpitations: Secondary | ICD-10-CM

## 2021-05-12 NOTE — Addendum Note (Signed)
Addended byConrad  D on: 05/12/2021 08:19 AM ? ? Modules accepted: Orders ? ?

## 2021-05-12 NOTE — Progress Notes (Unsigned)
Enrolled for [employer] to mail a ZIO XT long term holter monitor to the patients address on file.  

## 2021-05-12 NOTE — Progress Notes (Signed)
Office Visit Note  Patient: Grace Butler             Date of Birth: 1970/09/16           MRN: [id number]             PCP: Darreld Mclean, MD Referring: Darreld Mclean, MD Visit Date: 05/26/2021   Subjective:  Rheumatoid Arthritis (Doing good)   History of Present Illness: Grace Butler is a 51 y.o. female here for follow up  for seronegative RA on Rinvoq 15mg  po qd, HCQ 400 mg PO daily, and diclofenac 25 mg PRN. She is doing well overall since last visit no major flare ups. No infections or antibiotic treatments needed. She does not have many bad days recently. She had lab testing drawn on 05/06/21 with her family medicine office including CBC, CMP, and TSH which were normal. Her wife was newly diagnosed with colorectal cancer and undergoing neoadjuvant chemoradiation therapy so she has increased stress and missed work time due to that.  Previous HPI 01/19/2021 Grace Butler is a 51 y.o. female here for follow up for seronegative RA on Enbrel 50 mg [place] weekly, HCQ 400 mg PO daily, and diclofenac 25 mg PRN. She has been doing well most of the time without a RA flare up but for the last few weeks having increased stiffness in bilateral legs, also pain but less of a problem than the stiffness. She fell twice without major injury. She has an appointment to start working with a Physiological scientist on Thursday plans to do resistance training for strengthening and ideally some weight loss. She is interested in information about Rinvoq after hearing about the medication.   Previous HPI 10/20/20 [name] is a 51 y.o. female here for follow up for seronegative rheumatoid arthritis after restarting treatment with Enbrel 50 mg [place] weekly with continued HCQ 400 mg PO daily. Workup at that visit with negative inflammatory serology, xray of left knee does show osteoarthritis changes. She had been feeling pretty well after starting Enbrel although continuing to take diclofenac 25 mg BID regularly.  However in the past few weeks she is feeling worse, with several major life and family stressors going on. 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 Her joint pain is worse especially knees and ankles. Not especially stiff though. Recent fall onto right elbow and right knee without major injury. Had right knee injection with orthopedic surgery for pain and swelling there, with an improvement.   Previous HPI 07/29/20 Grace Butler is a 51 y.o. female here for transition of care for seronegative rheumatoid arthritis previously managed with [facility] rheumatology.  Rheumatoid arthritis was diagnosed in 2013 with development of bilateral hand and feet swelling and morning stiffness.  Previous treatments include methotrexate stopped due to elevated transaminases.  She was treated with Humira for about 6 months but with increasing injection site reaction and pain did not tolerate this well.  She was switched to Enbrel continued this for several months but also stopped due to the same problem of local injection site reactions and pain.  She is not sure whether the medicine was ever effective due to early discontinuation.  She started [place] in 2016 she felt greatly improved her symptoms continue this medicine until 2021 she does question whether there  was loss of efficacy.  After [name] she started hydroxychloroquine has been on this since October 2021 does not feel this medication has been highly effective for her joint pain.  Currently she complains of daily pain worst in her bilateral knees and feet.  She describes the pain as sharp but states her foot pain feels more like a pressure sensation.  The knee pain is exacerbated climbing stairs.  She notices stiffness pain and redness over her MCP joints does not describe discrete swelling in this  area lately.  She is not sure whether there is knee or ankle swelling related to her arthritis body habitus and swelling and redness she gets in the feet from standing during the day.  Currently she takes Celebrex once daily in the mornings and takes Tylenol usually twice a day for her ongoing joint pain.  She previously took oral diclofenac that she found helpful but was switched due to concern of long-term NSAID exposure.  She takes hydroxychloroquine 400 mg/day.   DMARD Hx MTX d/c LFTs Humira d/c injection site reactions Enbrel d/c injection site reactions (old formulation) [name] ?loss of efficacy   Labs  02/2014 HBV/HCV neg RF neg CCP neg   Review of Systems  Constitutional:  Positive for fatigue.  HENT:  Positive for mouth dryness.   Eyes:  Negative for dryness.  Respiratory:  Negative for shortness of breath.   Cardiovascular:  Negative for swelling in legs/feet.  Gastrointestinal:  Negative for constipation.  Endocrine: Positive for cold intolerance and heat intolerance.  Genitourinary:  Negative for difficulty urinating.  Musculoskeletal:  Positive for joint pain, joint pain and morning stiffness.  Skin:  Negative for rash.  Allergic/Immunologic: Negative for susceptible to infections.  Neurological:  Negative for numbness.  Hematological:  Negative for bruising/bleeding tendency.  Psychiatric/Behavioral:  Negative for sleep disturbance.    PMFS History:  Patient Active Problem List   Diagnosis Date Noted   Acute non-recurrent pansinusitis 03/09/2021   High risk medication use 07/29/2020   Bilateral knee pain 07/29/2020   Attention deficit hyperactivity disorder (ADHD) 09/28/2017   Insomnia, unspecified 09/28/2017   Acute meniscal tear of knee, left, subsequent encounter 02/10/2017   Left knee pain 09/01/2016   Obesity 01/14/2016   Rheumatoid arthritis ([place]) 01/14/2016   Hypothyroidism due to acquired atrophy of thyroid 01/14/2016   Osteopenia 09/15/2015    Bipolar 1 disorder ([place]) 08/21/2015   Gastroesophageal reflux disease without esophagitis 08/21/2015   Migraine 08/21/2015    Past Medical History:  Diagnosis Date   Bipolar 1 disorder ([place])    Chronic headache    Depression    Gallstone    Hypertension    IBS (irritable bowel syndrome)    Obesity    Rheumatoid arthritis ([place])    Thyroid disease    Ulcer     Family History  Problem Relation Age of Onset   Hypertension Mother    Heart attack Father    Healthy Sister    Healthy Sister    Healthy Sister    Healthy Brother    Healthy Son    Rheum arthritis Paternal Uncle    Colon cancer Neg Hx    Esophageal cancer Neg Hx    Colon polyps Neg Hx    Rectal cancer Neg Hx    Stomach cancer Neg Hx    Past Surgical History:  Procedure Laterality Date   ABDOMINAL HYSTERECTOMY     APPENDECTOMY     CHOLECYSTECTOMY     COLONOSCOPY  09/07/2018  CYSTECTOMY     dermoid   ESOPHAGOGASTRODUODENOSCOPY     about 20 years ago to check for ulcers   KNEE ARTHROSCOPY Left 2019   MYOMECTOMY     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 52   UPPER GASTROINTESTINAL ENDOSCOPY     Social History   Social History Narrative   Not on file   Immunization History  Administered Date(s) Administered   Influenza,inj,Quad PF,6+ Mos 01/14/2016, 09/13/2017   Influenza-Unspecified 10/31/2016, 08/06/2018   Pneumococcal Conjugate-13 03/24/2014   Pneumococcal Polysaccharide-23 05/26/2014   Pneumococcal-Unspecified 05/26/2014   Tdap 11/10/2017     Objective: Vital Signs: BP 118/79 (BP Location: Left Arm, Patient Position: Sitting, Cuff Size: Normal)   Pulse 69   Resp 16   Ht 5\' 9"  (1.753 m)   Wt (!) 322 lb (146.1 kg)   BMI 47.55 kg/m    Physical Exam Constitutional:      Appearance: She is obese.  Cardiovascular:     Rate and Rhythm: Normal rate and regular rhythm.  Pulmonary:     Effort: Pulmonary effort is normal.     Breath sounds: Normal breath sounds.  Musculoskeletal:     Right lower leg: No  edema.     Left lower leg: No edema.  Skin:    General: Skin is warm and dry.     Findings: No rash.  Neurological:     Mental Status: She is alert.  Psychiatric:        Mood and Affect: Mood normal.     Musculoskeletal Exam:  Shoulders full ROM no tenderness or swelling Elbows full ROM no tenderness or swelling Wrists full ROM no tenderness or swelling Fingers full ROM no tenderness or swelling Knees full ROM no tenderness or swelling patellofemoral crepitus present b/l Ankles full ROM no tenderness or swelling   CDAI Exam: CDAI Score: 2  Patient Global: 10 mm; Provider Global: 10 mm Swollen: 0 ; Tender: 0  Joint Exam 05/26/2021   All documented joints were normal     Investigation: No additional findings.  Imaging: No results found.  Recent Labs: Lab Results  Component Value Date   WBC 4.8 05/06/2021   HGB 12.9 05/06/2021   PLT 274.0 05/06/2021   NA 138 05/06/2021   K 4.5 05/06/2021   CL 102 05/06/2021   CO2 27 05/06/2021   GLUCOSE 79 05/06/2021   BUN 10 05/06/2021   CREATININE 0.78 05/06/2021   BILITOT 0.5 01/25/2021   ALKPHOS 99 01/25/2021   AST 23 01/25/2021   ALT 27 01/25/2021   PROT 6.9 01/25/2021   ALBUMIN 4.2 01/25/2021   CALCIUM 9.4 05/06/2021   GFRAA >60 04/23/2019   QFTBGOLDPLUS NEGATIVE 07/29/2020    Speciality Comments: PLQ eye exam scheduled 06/02/2021 per patient [facility] [place]  Procedures:  No procedures performed Allergies: Coconut (cocos nucifera), Codeine, Sulfa antibiotics, and Trazodone and nefazodone   Assessment / Plan:     Visit Diagnoses: Rheumatoid arthritis with negative rheumatoid factor, involving unspecified site [facility])  Doing quite well today on current regimen. Plan to continue rinvoq 15 mg daily HCQ 400 mg daily and diclofenac 25 mg BID. New Rx were send last month on all 3 medications. F/U in about 78mos.  High risk medication use - Rinvoq 15mg  po qd and HCQ 400 mg daily  Recent labs reviewed look okay  for continuing rinvoq treatment. I reviewed results of testing from 5/4. I would prefer to avoid repeating blood test after recent values. Will  need LFTs and quantiferon screening in next follow up. She sees [facility] and had normal OCT testing but is scheduled for visual field test next week form provided to contact us with results.  Chronic pain of both knees -  Not in exacerbation, some stiffness and crepitus appears mostly OA related and controlled with diclofenac.    Orders: No orders of the defined types were placed in this encounter.  No orders of the defined types were placed in this encounter.    Follow-Up Instructions: Return in about 3 months (around 08/26/2021) for RA on UPA/HCQ f/u 87mos.   Collier Salina, MD  Note - This record has been created using [employer].  Chart creation errors have been sought, but may not always  have been located. Such creation errors do not reflect on  the standard of medical care.

## 2021-05-19 ENCOUNTER — Other Ambulatory Visit (HOSPITAL_COMMUNITY): Payer: Self-pay

## 2021-05-26 ENCOUNTER — Other Ambulatory Visit (HOSPITAL_COMMUNITY): Payer: Self-pay

## 2021-05-26 ENCOUNTER — Encounter: Payer: Self-pay | Admitting: Internal Medicine

## 2021-05-26 ENCOUNTER — Ambulatory Visit (INDEPENDENT_AMBULATORY_CARE_PROVIDER_SITE_OTHER): Payer: BC Managed Care – PPO | Admitting: Internal Medicine

## 2021-05-26 ENCOUNTER — Other Ambulatory Visit: Payer: Self-pay | Admitting: Physician Assistant

## 2021-05-26 VITALS — BP 118/79 | HR 69 | Resp 16 | Ht 69.0 in | Wt 322.0 lb

## 2021-05-26 DIAGNOSIS — M25561 Pain in right knee: Secondary | ICD-10-CM

## 2021-05-26 DIAGNOSIS — G43909 Migraine, unspecified, not intractable, without status migrainosus: Secondary | ICD-10-CM

## 2021-05-26 DIAGNOSIS — M25562 Pain in left knee: Secondary | ICD-10-CM | POA: Diagnosis not present

## 2021-05-26 DIAGNOSIS — Z79899 Other long term (current) drug therapy: Secondary | ICD-10-CM

## 2021-05-26 DIAGNOSIS — M0600 Rheumatoid arthritis without rheumatoid factor, unspecified site: Secondary | ICD-10-CM | POA: Diagnosis not present

## 2021-05-26 DIAGNOSIS — G8929 Other chronic pain: Secondary | ICD-10-CM

## 2021-05-27 ENCOUNTER — Other Ambulatory Visit (HOSPITAL_COMMUNITY): Payer: Self-pay

## 2021-05-28 ENCOUNTER — Other Ambulatory Visit: Payer: Self-pay | Admitting: Physician Assistant

## 2021-05-28 ENCOUNTER — Other Ambulatory Visit (HOSPITAL_COMMUNITY): Payer: Self-pay

## 2021-05-28 ENCOUNTER — Telehealth: Payer: Self-pay | Admitting: Physician Assistant

## 2021-05-28 MED ORDER — LISDEXAMFETAMINE DIMESYLATE 40 MG PO CAPS
40.0000 mg | ORAL_CAPSULE | ORAL | 0 refills | Status: DC
  Filled 2021-06-02: qty 30, 30d supply, fill #0

## 2021-05-28 NOTE — Telephone Encounter (Signed)
Grace Butler called today at 1:39 to request that her prescription for vyvanse be transferred to [facility].

## 2021-05-28 NOTE — Telephone Encounter (Signed)
Rx sent 

## 2021-06-01 ENCOUNTER — Other Ambulatory Visit (HOSPITAL_COMMUNITY): Payer: Self-pay

## 2021-06-01 DIAGNOSIS — Z79899 Other long term (current) drug therapy: Secondary | ICD-10-CM | POA: Diagnosis not present

## 2021-06-02 ENCOUNTER — Other Ambulatory Visit (HOSPITAL_COMMUNITY): Payer: Self-pay

## 2021-06-15 ENCOUNTER — Other Ambulatory Visit (HOSPITAL_COMMUNITY): Payer: Self-pay

## 2021-06-16 ENCOUNTER — Other Ambulatory Visit: Payer: Self-pay | Admitting: Family Medicine

## 2021-06-16 ENCOUNTER — Other Ambulatory Visit (HOSPITAL_COMMUNITY): Payer: Self-pay

## 2021-06-16 DIAGNOSIS — I10 Essential (primary) hypertension: Secondary | ICD-10-CM

## 2021-06-16 MED ORDER — LISINOPRIL 10 MG PO TABS
10.0000 mg | ORAL_TABLET | Freq: Every day | ORAL | 0 refills | Status: DC
  Filled 2021-06-16: qty 30, 30d supply, fill #0

## 2021-06-19 ENCOUNTER — Encounter: Payer: Self-pay | Admitting: Family Medicine

## 2021-06-19 DIAGNOSIS — E034 Atrophy of thyroid (acquired): Secondary | ICD-10-CM

## 2021-06-25 ENCOUNTER — Other Ambulatory Visit: Payer: Self-pay

## 2021-06-25 ENCOUNTER — Other Ambulatory Visit (HOSPITAL_COMMUNITY): Payer: Self-pay

## 2021-06-25 ENCOUNTER — Telehealth: Payer: Self-pay | Admitting: Physician Assistant

## 2021-06-25 MED ORDER — GABAPENTIN 300 MG PO CAPS
300.0000 mg | ORAL_CAPSULE | Freq: Every day | ORAL | 0 refills | Status: DC
  Filled 2021-06-25: qty 90, 90d supply, fill #0

## 2021-06-25 NOTE — Telephone Encounter (Signed)
Rx sent 

## 2021-06-28 ENCOUNTER — Other Ambulatory Visit: Payer: Self-pay | Admitting: Physician Assistant

## 2021-06-28 NOTE — Telephone Encounter (Signed)
Last filled 5/31, due 6/29

## 2021-06-29 ENCOUNTER — Other Ambulatory Visit (HOSPITAL_COMMUNITY): Payer: Self-pay

## 2021-06-30 NOTE — Telephone Encounter (Signed)
Pt called requesting RF @ [facility] on Vyvanse

## 2021-07-01 ENCOUNTER — Other Ambulatory Visit (HOSPITAL_COMMUNITY): Payer: Self-pay

## 2021-07-01 MED ORDER — LISDEXAMFETAMINE DIMESYLATE 40 MG PO CAPS
40.0000 mg | ORAL_CAPSULE | ORAL | 0 refills | Status: DC
  Filled 2021-07-01: qty 30, 30d supply, fill #0

## 2021-07-12 ENCOUNTER — Other Ambulatory Visit: Payer: Self-pay | Admitting: Internal Medicine

## 2021-07-12 ENCOUNTER — Other Ambulatory Visit (HOSPITAL_COMMUNITY): Payer: Self-pay

## 2021-07-12 DIAGNOSIS — M0600 Rheumatoid arthritis without rheumatoid factor, unspecified site: Secondary | ICD-10-CM

## 2021-07-12 DIAGNOSIS — Z79899 Other long term (current) drug therapy: Secondary | ICD-10-CM

## 2021-07-12 NOTE — Telephone Encounter (Signed)
Next Visit: 08/30/2021  Last Visit: 05/26/2021  Last Fill: 04/21/2021  DX: Rheumatoid arthritis with negative rheumatoid factor, involving unspecified site   Current Dose per office note 05/26/2021: Rinvoq 15mg  po qd  Labs: 05/06/2021 WNL  TB Gold: 07/29/2020 Neg    Okay to refill Rinvoq?

## 2021-07-13 ENCOUNTER — Other Ambulatory Visit (HOSPITAL_COMMUNITY): Payer: Self-pay

## 2021-07-13 MED ORDER — RINVOQ 15 MG PO TB24
15.0000 mg | ORAL_TABLET | Freq: Every day | ORAL | 1 refills | Status: DC
  Filled 2021-07-14: qty 30, 30d supply, fill #0
  Filled ????-??-??: fill #0

## 2021-07-14 ENCOUNTER — Other Ambulatory Visit (HOSPITAL_COMMUNITY): Payer: Self-pay

## 2021-07-15 ENCOUNTER — Other Ambulatory Visit (HOSPITAL_COMMUNITY): Payer: Self-pay

## 2021-07-16 ENCOUNTER — Other Ambulatory Visit (HOSPITAL_COMMUNITY): Payer: Self-pay

## 2021-07-20 ENCOUNTER — Other Ambulatory Visit: Payer: Self-pay | Admitting: Physician Assistant

## 2021-07-20 ENCOUNTER — Other Ambulatory Visit (HOSPITAL_COMMUNITY): Payer: Self-pay

## 2021-07-20 MED ORDER — ARIPIPRAZOLE 15 MG PO TABS
ORAL_TABLET | ORAL | 1 refills | Status: DC
  Filled 2021-07-20: qty 90, 90d supply, fill #0
  Filled 2021-07-26 – 2021-10-19 (×3): qty 90, 90d supply, fill #1

## 2021-07-22 ENCOUNTER — Telehealth: Payer: Self-pay | Admitting: Pharmacist

## 2021-07-22 NOTE — Telephone Encounter (Signed)
Error

## 2021-07-23 ENCOUNTER — Other Ambulatory Visit: Payer: Self-pay | Admitting: Family Medicine

## 2021-07-23 ENCOUNTER — Other Ambulatory Visit (HOSPITAL_COMMUNITY): Payer: Self-pay

## 2021-07-23 DIAGNOSIS — I10 Essential (primary) hypertension: Secondary | ICD-10-CM

## 2021-07-23 MED ORDER — LISINOPRIL 10 MG PO TABS
10.0000 mg | ORAL_TABLET | Freq: Every day | ORAL | 0 refills | Status: DC
  Filled 2021-07-23: qty 30, 30d supply, fill #0

## 2021-07-23 MED FILL — Omeprazole Cap Delayed Release 40 MG: ORAL | 90 days supply | Qty: 180 | Fill #1 | Status: CN

## 2021-07-26 ENCOUNTER — Other Ambulatory Visit (HOSPITAL_COMMUNITY): Payer: Self-pay

## 2021-07-26 ENCOUNTER — Other Ambulatory Visit: Payer: Self-pay | Admitting: Physician Assistant

## 2021-07-26 MED ORDER — LISDEXAMFETAMINE DIMESYLATE 40 MG PO CAPS
40.0000 mg | ORAL_CAPSULE | ORAL | 0 refills | Status: DC
  Filled 2021-08-30 – 2021-09-30 (×5): qty 30, 30d supply, fill #0

## 2021-07-26 MED ORDER — LISDEXAMFETAMINE DIMESYLATE 40 MG PO CAPS: 40.0000 mg | ORAL_CAPSULE | ORAL | 0 refills | Status: DC

## 2021-07-26 MED ORDER — LISDEXAMFETAMINE DIMESYLATE 40 MG PO CAPS
40.0000 mg | ORAL_CAPSULE | ORAL | 0 refills | Status: DC
  Filled 2021-07-30 – 2021-09-01 (×5): qty 30, 30d supply, fill #0

## 2021-07-27 ENCOUNTER — Other Ambulatory Visit (HOSPITAL_COMMUNITY): Payer: Self-pay

## 2021-07-27 ENCOUNTER — Other Ambulatory Visit: Payer: Self-pay

## 2021-07-27 ENCOUNTER — Telehealth: Payer: Self-pay | Admitting: Physician Assistant

## 2021-07-27 MED ORDER — QUETIAPINE FUMARATE 50 MG PO TABS
150.0000 mg | ORAL_TABLET | Freq: Every day | ORAL | 0 refills | Status: DC
  Filled 2021-07-27: qty 270, 90d supply, fill #0

## 2021-07-27 NOTE — Telephone Encounter (Signed)
Pt LVM @ 12:26p. She would like refill of Seroquel sent to [facility].  Next appt 10/22

## 2021-07-27 NOTE — Telephone Encounter (Signed)
Next appt 10/2

## 2021-07-27 NOTE — Telephone Encounter (Signed)
Rx sent 

## 2021-07-28 ENCOUNTER — Other Ambulatory Visit (HOSPITAL_COMMUNITY): Payer: Self-pay

## 2021-07-29 ENCOUNTER — Other Ambulatory Visit (HOSPITAL_COMMUNITY): Payer: Self-pay

## 2021-07-30 ENCOUNTER — Other Ambulatory Visit (HOSPITAL_COMMUNITY): Payer: Self-pay

## 2021-08-03 ENCOUNTER — Other Ambulatory Visit (HOSPITAL_COMMUNITY): Payer: Self-pay

## 2021-08-03 MED FILL — Omeprazole Cap Delayed Release 40 MG: ORAL | 90 days supply | Qty: 180 | Fill #1 | Status: AC

## 2021-08-04 ENCOUNTER — Other Ambulatory Visit (HOSPITAL_COMMUNITY): Payer: Self-pay

## 2021-08-04 ENCOUNTER — Other Ambulatory Visit: Payer: Self-pay | Admitting: Pharmacist

## 2021-08-04 DIAGNOSIS — Z79899 Other long term (current) drug therapy: Secondary | ICD-10-CM

## 2021-08-04 DIAGNOSIS — M0600 Rheumatoid arthritis without rheumatoid factor, unspecified site: Secondary | ICD-10-CM

## 2021-08-04 MED ORDER — RINVOQ 15 MG PO TB24
15.0000 mg | ORAL_TABLET | Freq: Every day | ORAL | 1 refills | Status: DC
  Filled 2021-08-04: qty 30, 30d supply, fill #0

## 2021-08-04 MED ORDER — RINVOQ 15 MG PO TB24
15.0000 mg | ORAL_TABLET | Freq: Every day | ORAL | 0 refills | Status: DC
  Filled 2021-08-04 – 2021-08-27 (×2): qty 30, 30d supply, fill #0

## 2021-08-04 NOTE — Telephone Encounter (Signed)
Rx for Rinvoq 15mg daily sent to [facility] for hospital-based cost pricing.   Total qty remaining is 30 tabs   Lonita Debes, PharmD, MPH, BCPS, CPP Clinical Pharmacist (Rheumatology and Pulmonology) 

## 2021-08-09 ENCOUNTER — Encounter: Payer: Self-pay | Admitting: Family Medicine

## 2021-08-09 MED ORDER — RIZATRIPTAN BENZOATE 10 MG PO TBDP: 10.0000 mg | ORAL_TABLET | ORAL | 0 refills | Status: DC | PRN

## 2021-08-09 MED ORDER — BUTALBITAL-APAP-CAFFEINE 50-325-40 MG PO TABS: 1.0000 | ORAL_TABLET | Freq: Four times a day (QID) | ORAL | 0 refills | Status: DC | PRN

## 2021-08-09 NOTE — Telephone Encounter (Signed)
Would you like to see the pT?

## 2021-08-09 NOTE — Telephone Encounter (Signed)
Called pt to check on her- she states having a typical migraine, she declines to be seen today She took a Astronomer- she is on 10 mg dissolvable tablets- but noted they expired in 2021 Will refill this for her, also rx fioricet to use for now.  Advised pt this may cause some sedation and she will not use if driving

## 2021-08-17 ENCOUNTER — Other Ambulatory Visit (HOSPITAL_COMMUNITY): Payer: Self-pay

## 2021-08-18 ENCOUNTER — Other Ambulatory Visit (HOSPITAL_COMMUNITY): Payer: Self-pay

## 2021-08-23 ENCOUNTER — Other Ambulatory Visit (HOSPITAL_COMMUNITY): Payer: Self-pay

## 2021-08-23 ENCOUNTER — Other Ambulatory Visit: Payer: Self-pay | Admitting: Family Medicine

## 2021-08-23 DIAGNOSIS — I10 Essential (primary) hypertension: Secondary | ICD-10-CM

## 2021-08-23 MED ORDER — LISINOPRIL 10 MG PO TABS
10.0000 mg | ORAL_TABLET | Freq: Every day | ORAL | 0 refills | Status: DC
  Filled 2021-08-23: qty 30, 30d supply, fill #0

## 2021-08-25 NOTE — Progress Notes (Signed)
Office Visit Note  Patient: Grace Butler             Date of Birth: 07/21/1970           MRN: [id number]             PCP: Darreld Mclean, MD Referring: Darreld Mclean, MD Visit Date: 08/30/2021   Subjective:  Follow-up (Right elbow and right hip pain since recent fall. Feeling good otherwise.)   History of Present Illness: Grace Butler is a 51 y.o. female here for follow up for seronegative RA on Rinvoq 15 mg daily hydroxychloroquine 400 mg daily and diclofenac 25 mg as needed.  She has not had any major flareups or exacerbations since her last visit or required any steroid treatment.  No new infections or antibiotic treatment required.  This morning at work she fell describes as tripping over her own foot and landed incurring some abrasion on her right elbow as well as lateral right knee and right hip.  Previous HPI 05/26/2021 Grace Butler is a 51 y.o. female here for follow up  for seronegative RA on Rinvoq 15mg  po qd, HCQ 400 mg PO daily, and diclofenac 25 mg PRN. She is doing well overall since last visit no major flare ups. No infections or antibiotic treatments needed. She does not have many bad days recently. She had lab testing drawn on 05/06/21 with her family medicine office including CBC, CMP, and TSH which were normal. Her wife was newly diagnosed with colorectal cancer and undergoing neoadjuvant chemoradiation therapy so she has increased stress and missed work time due to that.   Previous HPI 01/19/2021 Grace Butler is a 51 y.o. female here for follow up for seronegative RA on Enbrel 50 mg [place] weekly, HCQ 400 mg PO daily, and diclofenac 25 mg PRN. She has been doing well most of the time without a RA flare up but for the last few weeks having increased stiffness in bilateral legs, also pain but less of a problem than the stiffness. She fell twice without major injury. She has an appointment to start working with a Physiological scientist on Thursday plans to do resistance  training for strengthening and ideally some weight loss. She is interested in information about Rinvoq after hearing about the medication.   Previous HPI 10/20/20 Grace Butler is a 51 y.o. female here for follow up for seronegative rheumatoid arthritis after restarting treatment with Enbrel 50 mg [place] weekly with continued HCQ 400 mg PO daily. Workup at that visit with negative inflammatory serology, xray of left knee does show osteoarthritis changes. She had been feeling pretty well after starting Enbrel although continuing to take diclofenac 25 mg BID regularly. However in the past few weeks she is feeling worse, with several major life and family stressors going on. 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 Her joint pain is worse especially knees and ankles. Not especially stiff though. Recent fall onto right elbow and right knee without major injury. Had right knee injection with orthopedic surgery for pain and swelling there, with an improvement.   Previous HPI 07/29/20 Grace Butler is a 51 y.o. female here for transition of care for seronegative rheumatoid arthritis previously managed with [facility] rheumatology.  Rheumatoid arthritis was diagnosed in 2013 with development of bilateral  hand and feet swelling and morning stiffness.  Previous treatments include methotrexate stopped due to elevated transaminases.  She was treated with Humira for about 6 months but with increasing injection site reaction and pain did not tolerate this well.  She was switched to Enbrel continued this for several months but also stopped due to the same problem of local injection site reactions and pain.  She is not sure whether the medicine was ever effective due to early discontinuation.  She started [place] in 2016 she felt greatly improved her  symptoms continue this medicine until 2021 she does question whether there was loss of efficacy.  After Harriette Ohara she started hydroxychloroquine has been on this since October 2021 does not feel this medication has been highly effective for her joint pain.  Currently she complains of daily pain worst in her bilateral knees and feet.  She describes the pain as sharp but states her foot pain feels more like a pressure sensation.  The knee pain is exacerbated climbing stairs.  She notices stiffness pain and redness over her MCP joints does not describe discrete swelling in this area lately.  She is not sure whether there is knee or ankle swelling related to her arthritis body habitus and swelling and redness she gets in the feet from standing during the day.  Currently she takes Celebrex once daily in the mornings and takes Tylenol usually twice a day for her ongoing joint pain.  She previously took oral diclofenac that she found helpful but was switched due to concern of long-term NSAID exposure.  She takes hydroxychloroquine 400 mg/day.   DMARD Hx MTX d/c LFTs Humira d/c injection site reactions Enbrel d/c injection site reactions (old formulation) Harriette Ohara ?loss of efficacy   Labs  02/2014 HBV/HCV neg RF neg CCP neg   Review of Systems  Constitutional:  Positive for fatigue.  HENT:  Negative for mouth sores.   Eyes:  Negative for dryness.  Respiratory:  Negative for shortness of breath.   Cardiovascular:  Negative for chest pain and palpitations.  Gastrointestinal:  Negative for blood in stool, constipation and diarrhea.  Endocrine: Negative for increased urination.  Genitourinary:  Negative for involuntary urination.  Musculoskeletal:  Positive for morning stiffness. Negative for joint pain, gait problem, joint pain, joint swelling, myalgias, muscle weakness, muscle tenderness and myalgias.  Skin:  Negative for color change, rash, hair loss and sensitivity to sunlight.  Allergic/Immunologic:  Negative for susceptible to infections.  Neurological:  Positive for headaches. Negative for dizziness.  Hematological:  Negative for swollen glands.  Psychiatric/Behavioral:  Positive for sleep disturbance. Negative for depressed mood. The patient is not nervous/anxious.     PMFS History:  Patient Active Problem List   Diagnosis Date Noted   Acute non-recurrent pansinusitis 03/09/2021   High risk medication use 07/29/2020   Bilateral knee pain 07/29/2020   Attention deficit hyperactivity disorder (ADHD) 09/28/2017   Insomnia, unspecified 09/28/2017   Acute meniscal tear of knee, left, subsequent encounter 02/10/2017   Left knee pain 09/01/2016   Obesity 01/14/2016   Rheumatoid arthritis (HCC) 01/14/2016   Hypothyroidism due to acquired atrophy of thyroid 01/14/2016   Osteopenia 09/15/2015   Bipolar 1 disorder (HCC) 08/21/2015   Gastroesophageal reflux disease without esophagitis 08/21/2015   Migraine 08/21/2015    Past Medical History:  Diagnosis Date   Bipolar 1 disorder (HCC)    Chronic headache    Depression    Gallstone    Hypertension    IBS (irritable bowel syndrome)  Obesity    Rheumatoid arthritis (HCC)    Thyroid disease    Ulcer     Family History  Problem Relation Age of Onset   Hypertension Mother    Heart attack Father    Healthy Sister    Healthy Sister    Healthy Sister    Healthy Brother    Healthy Son    Rheum arthritis Paternal Uncle    Colon cancer Neg Hx    Esophageal cancer Neg Hx    Colon polyps Neg Hx    Rectal cancer Neg Hx    Stomach cancer Neg Hx    Past Surgical History:  Procedure Laterality Date   ABDOMINAL HYSTERECTOMY     APPENDECTOMY     CHOLECYSTECTOMY     COLONOSCOPY  09/07/2018   CYSTECTOMY     dermoid   ESOPHAGOGASTRODUODENOSCOPY     about 20 years ago to check for ulcers   KNEE ARTHROSCOPY Left 2019   MYOMECTOMY     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 4   UPPER GASTROINTESTINAL ENDOSCOPY     Social History   Social  History Narrative   Not on file   Immunization History  Administered Date(s) Administered   Influenza,inj,Quad PF,6+ Mos 01/14/2016, 09/13/2017   Influenza-Unspecified 10/31/2016, 08/06/2018   Pneumococcal Conjugate-13 03/24/2014   Pneumococcal Polysaccharide-23 05/26/2014   Pneumococcal-Unspecified 05/26/2014   Tdap 11/10/2017     Objective: Vital Signs: BP 121/82 (BP Location: Left Arm, Patient Position: Sitting, Cuff Size: Large)   Pulse 73   Resp 15   Ht 5\' 9"  (1.753 m)   Wt (!) 331 lb 6.4 oz (150.3 kg)   BMI 48.94 kg/m    Physical Exam Constitutional:      Appearance: She is obese.  Cardiovascular:     Rate and Rhythm: Normal rate and regular rhythm.  Pulmonary:     Effort: Pulmonary effort is normal.     Breath sounds: Normal breath sounds.  Musculoskeletal:     Right lower leg: No edema.     Left lower leg: No edema.  Skin:    General: Skin is warm and dry.     Findings: Bruising present.  Neurological:     Mental Status: She is alert.  Psychiatric:        Mood and Affect: Mood normal.      Musculoskeletal Exam:  Shoulders full ROM no tenderness or swelling Elbows full ROM, right elbow with Band-Aid on extensor surface with some tenderness, no palpable swelling Wrists full ROM no tenderness or swelling Fingers full ROM no tenderness or swelling Right lateral hip tenderness to pressure superficially and distal to the head of the greater trochanter Knees full ROM patellofemoral crepitus present, mild right sided tenderness to pressure no palpable effusion   CDAI Exam: CDAI Score: 4  Patient Global: 10 mm; Provider Global: 10 mm Swollen: 0 ; Tender: 3  Joint Exam 08/30/2021      Right  Left  Elbow   Tender     Hip   Tender     Knee   Tender        Investigation: No additional findings.  Imaging: No results found.  Recent Labs: Lab Results  Component Value Date   WBC 4.8 05/06/2021   HGB 12.9 05/06/2021   PLT 274.0 05/06/2021   NA 138  05/06/2021   K 4.5 05/06/2021   CL 102 05/06/2021   CO2 27 05/06/2021   GLUCOSE 79 05/06/2021   BUN  10 05/06/2021   CREATININE 0.78 05/06/2021   BILITOT 0.5 01/25/2021   ALKPHOS 99 01/25/2021   AST 23 01/25/2021   ALT 27 01/25/2021   PROT 6.9 01/25/2021   ALBUMIN 4.2 01/25/2021   CALCIUM 9.4 05/06/2021   GFRAA >60 04/23/2019   QFTBGOLDPLUS NEGATIVE 07/29/2020    Speciality Comments: PLQ eye exam scheduled 05/20/2021 per patient [facility], [place] f/u 12 months  Procedures:  No procedures performed Allergies: Coconut (cocos nucifera), Codeine, Sulfa antibiotics, and Trazodone and nefazodone   Assessment / Plan:     Visit Diagnoses: Rheumatoid arthritis with negative rheumatoid factor, involving unspecified site (HCC) - Plan: Sedimentation rate  RA symptoms appear to be well controlled checking sedimentation rate for disease activity monitoring.  Plan to continue Rinvoq 15 mg daily hydroxychloroquine 400 mg daily and as needed diclofenac.  High risk medication use - Rinvoq 15mg  po qd and HCQ 400 mg daily - Plan: CBC with Differential/Platelet, COMPLETE METABOLIC PANEL WITH GFR  Checking CBC and CMP for Rinvoq medication monitoring.  She had recent ophthalmology exam in May which looked fine for hydroxychloroquine retinal toxicity screening.  Chronic pain of both knees  Symptoms currently at baseline without significant exacerbation.  Some right lateral knee tenderness related to fall does not appear to have any range of motion deficit or swelling   Orders: Orders Placed This Encounter  Procedures   CBC with Differential/Platelet   COMPLETE METABOLIC PANEL WITH GFR   Sedimentation rate   No orders of the defined types were placed in this encounter.    Follow-Up Instructions: Return in about 3 months (around 11/30/2021) for RA on UPA/HCQ f/u 22mos.   0mo, MD  Note - This record has been created using [employer].  Chart creation errors have been  sought, but may not always  have been located. Such creation errors do not reflect on  the standard of medical care.

## 2021-08-27 ENCOUNTER — Other Ambulatory Visit (HOSPITAL_COMMUNITY): Payer: Self-pay

## 2021-08-28 ENCOUNTER — Other Ambulatory Visit (HOSPITAL_COMMUNITY): Payer: Self-pay

## 2021-08-30 ENCOUNTER — Encounter: Payer: Self-pay | Admitting: Internal Medicine

## 2021-08-30 ENCOUNTER — Other Ambulatory Visit (HOSPITAL_COMMUNITY): Payer: Self-pay

## 2021-08-30 ENCOUNTER — Ambulatory Visit: Payer: BC Managed Care – PPO | Attending: Internal Medicine | Admitting: Internal Medicine

## 2021-08-30 VITALS — BP 121/82 | HR 73 | Resp 15 | Ht 69.0 in | Wt 331.4 lb

## 2021-08-30 DIAGNOSIS — M25562 Pain in left knee: Secondary | ICD-10-CM

## 2021-08-30 DIAGNOSIS — Z79899 Other long term (current) drug therapy: Secondary | ICD-10-CM | POA: Diagnosis not present

## 2021-08-30 DIAGNOSIS — M25561 Pain in right knee: Secondary | ICD-10-CM | POA: Diagnosis not present

## 2021-08-30 DIAGNOSIS — M0600 Rheumatoid arthritis without rheumatoid factor, unspecified site: Secondary | ICD-10-CM | POA: Diagnosis not present

## 2021-08-30 DIAGNOSIS — G8929 Other chronic pain: Secondary | ICD-10-CM

## 2021-08-31 ENCOUNTER — Other Ambulatory Visit (HOSPITAL_COMMUNITY): Payer: Self-pay

## 2021-09-01 ENCOUNTER — Other Ambulatory Visit (HOSPITAL_COMMUNITY): Payer: Self-pay

## 2021-09-01 ENCOUNTER — Encounter: Payer: Self-pay | Admitting: Family Medicine

## 2021-09-02 ENCOUNTER — Other Ambulatory Visit (HOSPITAL_COMMUNITY): Payer: Self-pay

## 2021-09-10 ENCOUNTER — Encounter: Payer: Self-pay | Admitting: Family Medicine

## 2021-09-13 ENCOUNTER — Other Ambulatory Visit (HOSPITAL_COMMUNITY): Payer: Self-pay

## 2021-09-13 ENCOUNTER — Encounter: Payer: Self-pay | Admitting: Internal Medicine

## 2021-09-13 DIAGNOSIS — M0600 Rheumatoid arthritis without rheumatoid factor, unspecified site: Secondary | ICD-10-CM

## 2021-09-13 MED ORDER — PREDNISONE 5 MG PO TABS
ORAL_TABLET | ORAL | 0 refills | Status: AC
  Filled 2021-09-13: qty 30, 12d supply, fill #0

## 2021-09-17 ENCOUNTER — Other Ambulatory Visit (HOSPITAL_COMMUNITY): Payer: Self-pay

## 2021-09-20 ENCOUNTER — Other Ambulatory Visit: Payer: Self-pay | Admitting: Physician Assistant

## 2021-09-21 ENCOUNTER — Other Ambulatory Visit (HOSPITAL_COMMUNITY): Payer: Self-pay

## 2021-09-21 MED ORDER — GABAPENTIN 300 MG PO CAPS
300.0000 mg | ORAL_CAPSULE | Freq: Every day | ORAL | 0 refills | Status: DC
  Filled 2021-09-21: qty 90, 90d supply, fill #0

## 2021-09-22 ENCOUNTER — Other Ambulatory Visit (HOSPITAL_COMMUNITY): Payer: Self-pay

## 2021-09-23 ENCOUNTER — Other Ambulatory Visit: Payer: Self-pay | Admitting: Family Medicine

## 2021-09-23 ENCOUNTER — Other Ambulatory Visit (HOSPITAL_COMMUNITY): Payer: Self-pay

## 2021-09-23 ENCOUNTER — Other Ambulatory Visit: Payer: Self-pay | Admitting: Internal Medicine

## 2021-09-23 DIAGNOSIS — Z79899 Other long term (current) drug therapy: Secondary | ICD-10-CM

## 2021-09-23 DIAGNOSIS — M0600 Rheumatoid arthritis without rheumatoid factor, unspecified site: Secondary | ICD-10-CM

## 2021-09-23 DIAGNOSIS — I10 Essential (primary) hypertension: Secondary | ICD-10-CM

## 2021-09-24 ENCOUNTER — Other Ambulatory Visit (HOSPITAL_COMMUNITY): Payer: Self-pay

## 2021-09-24 MED ORDER — RINVOQ 15 MG PO TB24
15.0000 mg | ORAL_TABLET | Freq: Every day | ORAL | 3 refills | Status: DC
  Filled 2021-09-24: qty 30, 30d supply, fill #0
  Filled 2021-10-20: qty 30, 30d supply, fill #1
  Filled 2021-12-04: qty 30, 30d supply, fill #2
  Filled 2021-12-23: qty 30, 30d supply, fill #3

## 2021-09-24 NOTE — Telephone Encounter (Signed)
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

## 2021-09-26 ENCOUNTER — Other Ambulatory Visit: Payer: Self-pay | Admitting: Family Medicine

## 2021-09-26 ENCOUNTER — Encounter: Payer: Self-pay | Admitting: Internal Medicine

## 2021-09-26 DIAGNOSIS — I10 Essential (primary) hypertension: Secondary | ICD-10-CM

## 2021-09-26 DIAGNOSIS — E034 Atrophy of thyroid (acquired): Secondary | ICD-10-CM

## 2021-09-27 ENCOUNTER — Telehealth: Payer: Self-pay

## 2021-09-27 ENCOUNTER — Other Ambulatory Visit (HOSPITAL_COMMUNITY): Payer: Self-pay

## 2021-09-27 MED ORDER — LEVOTHYROXINE SODIUM 150 MCG PO TABS
150.0000 ug | ORAL_TABLET | Freq: Every day | ORAL | 3 refills | Status: DC
  Filled 2021-09-27: qty 30, 30d supply, fill #0
  Filled 2021-11-17: qty 30, 30d supply, fill #1
  Filled 2021-12-23: qty 30, 30d supply, fill #2
  Filled 2022-02-04: qty 30, 30d supply, fill #3

## 2021-09-27 MED ORDER — LISINOPRIL 10 MG PO TABS
10.0000 mg | ORAL_TABLET | Freq: Every day | ORAL | 0 refills | Status: DC
  Filled 2021-09-27: qty 30, 30d supply, fill #0

## 2021-09-27 NOTE — Telephone Encounter (Signed)
Patient called stating that her labs had not been processed. Patient states that she will come back in to get labs redrawn.

## 2021-09-27 NOTE — Telephone Encounter (Signed)
Left message for patient to call us back about lab results.

## 2021-09-30 ENCOUNTER — Other Ambulatory Visit (HOSPITAL_COMMUNITY): Payer: Self-pay

## 2021-10-01 ENCOUNTER — Other Ambulatory Visit (HOSPITAL_COMMUNITY): Payer: Self-pay

## 2021-10-01 NOTE — Progress Notes (Signed)
Patient left without being seen due to conflicting appointment and clinic running behind schedule.

## 2021-10-04 ENCOUNTER — Telehealth (INDEPENDENT_AMBULATORY_CARE_PROVIDER_SITE_OTHER): Payer: BC Managed Care – PPO | Admitting: Physician Assistant

## 2021-10-04 ENCOUNTER — Encounter: Payer: Self-pay | Admitting: Physician Assistant

## 2021-10-04 DIAGNOSIS — G4700 Insomnia, unspecified: Secondary | ICD-10-CM

## 2021-10-04 DIAGNOSIS — G43909 Migraine, unspecified, not intractable, without status migrainosus: Secondary | ICD-10-CM | POA: Diagnosis not present

## 2021-10-04 DIAGNOSIS — F902 Attention-deficit hyperactivity disorder, combined type: Secondary | ICD-10-CM | POA: Diagnosis not present

## 2021-10-04 DIAGNOSIS — F319 Bipolar disorder, unspecified: Secondary | ICD-10-CM

## 2021-10-04 DIAGNOSIS — F411 Generalized anxiety disorder: Secondary | ICD-10-CM

## 2021-10-04 DIAGNOSIS — G2581 Restless legs syndrome: Secondary | ICD-10-CM

## 2021-10-04 NOTE — Progress Notes (Signed)
[facility] Check  Patient ID: Grace Butler,  MRN: [id number]  PCP: Darreld Mclean, MD  Date of Evaluation: 10/04/2021 Time spent:20 minutes  Chief Complaint:  Chief Complaint   ADHD; Depression; Insomnia; Follow-up    Virtual Visit via Telehealth  I connected with patient by a video enabled telemedicine application (we attempted a video call but we were not able to connect so visit was completed on the phone.)  With their informed consent, and verified patient privacy and that I am speaking with the correct person using two identifiers.  I am private, in my office and the patient is at work.  I discussed the limitations, risks, security and privacy concerns of performing an evaluation and management service by video and the availability of in person appointments. I also discussed with the patient that there may be a patient responsible charge related to this service. The patient expressed understanding and agreed to proceed.   I discussed the assessment and treatment plan with the patient. The patient was provided an opportunity to ask questions and all were answered. The patient agreed with the plan and demonstrated an understanding of the instructions.   The patient was advised to call back or seek an in-person evaluation if the symptoms worsen or if the condition fails to improve as anticipated.  I provided 20 minutes of non-face-to-face time during this encounter.  HISTORY/CURRENT STATUS: HPI for 65-month med check.  Doing really well.  Feels that her medications are working just fine. Patient is able to enjoy things.  Energy and motivation are good.  Work is going well.   No extreme sadness, tearfulness, or feelings of hopelessness.  Sleeps well.  Gabapentin helps RLS. ADLs and personal hygiene are normal.  Appetite has not changed.  Weight is stable.  Not having a lot of anxiety unless it is triggered by something which she feels is normal.  Denies suicidal or  homicidal thoughts.  States that attention is good without easy distractibility.  Able to focus on things and finish tasks to completion.   Patient denies increased energy with decreased need for sleep, increased talkativeness, racing thoughts, impulsivity or risky behaviors, increased spending, increased libido, grandiosity, increased irritability or anger, paranoia, or hallucinations.  Denies dizziness, syncope, seizures, numbness, tingling, tremor, tics, unsteady gait, slurred speech, confusion. Denies muscle or joint pain, stiffness, or dystonia. Denies unexplained weight loss, frequent infections, or sores that heal slowly.  No polyphagia, polydipsia, or polyuria. Denies visual changes or paresthesias.   Individual Medical History/ Review of Systems: Changes? :No   RA is stable.  Allergies: Coconut (cocos nucifera), Codeine, Sulfa antibiotics, and Trazodone and nefazodone  Current Medications:  Current Outpatient Medications:    amitriptyline (ELAVIL) 50 MG tablet, Take 1 tablet (50 mg total) by mouth every evening., Disp: 90 tablet, Rfl: 1   ARIPiprazole (ABILIFY) 15 MG tablet, TAKE 1 TABLET BY MOUTH EACH DAY, Disp: 90 tablet, Rfl: 1   Ascorbic Acid (VITAMIN C) 100 MG tablet, Take 100 mg by mouth daily., Disp: , Rfl:    butalbital-acetaminophen-caffeine (FIORICET) 50-325-40 MG tablet, Take 1-2 tablets by mouth every 6 (six) hours as needed for headache. Max 6 per 24 hours, Disp: 14 tablet, Rfl: 0   calcium citrate-vitamin D (CITRACAL+D) 315-200 MG-UNIT tablet, Take 1 tablet by mouth 2 (two) times daily., Disp: , Rfl:    cholecalciferol (VITAMIN D) 1000 UNITS tablet, Take 1,000 Units by mouth daily., Disp: , Rfl:    diclofenac (VOLTAREN) 25 MG EC tablet,  Take 1 tablet (25 mg total) by mouth 2 (two) times daily as needed., Disp: 60 tablet, Rfl: 5   gabapentin (NEURONTIN) 300 MG capsule, Take 1 capsule (300 mg total) by mouth at bedtime., Disp: 90 capsule, Rfl: 0   hydroxychloroquine  (PLAQUENIL) 200 MG tablet, Take 1 tablet by mouth 2 times daily., Disp: 60 tablet, Rfl: 5   levothyroxine (SYNTHROID) 150 MCG tablet, Take 1 tablet by mouth daily before breakfast., Disp: 30 tablet, Rfl: 3   lisdexamfetamine (VYVANSE) 40 MG capsule, Take 1 capsule (40 mg total) by mouth every morning., Disp: 30 capsule, Rfl: 0   lisdexamfetamine (VYVANSE) 40 MG capsule, Take 1 capsule (40 mg total) by mouth every morning., Disp: 30 capsule, Rfl: 0   lisdexamfetamine (VYVANSE) 40 MG capsule, Take 1 capsule (40 mg total) by mouth every morning., Disp: 30 capsule, Rfl: 0   lisinopril (ZESTRIL) 10 MG tablet, Take 1 tablet (10 mg total) by mouth daily., Disp: 30 tablet, Rfl: 0   omeprazole (PRILOSEC) 40 MG capsule, TAKE 1 CAPSULE BY MOUTH 2 TIMES DAILY FOR ACID REFLUX, Disp: 180 capsule, Rfl: 3   QUEtiapine (SEROQUEL) 50 MG tablet, Take 3 tablets by mouth at bedtime., Disp: 270 tablet, Rfl: 0   rizatriptan (MAXALT-MLT) 10 MG disintegrating tablet, Take 1 tablet (10 mg total) by mouth as needed for migraine. May repeat in 2 hours if needed, Disp: 10 tablet, Rfl: 0   Specialty Vitamins Products (MAGNESIUM, AMINO ACID CHELATE,) 133 MG tablet, Take 1 tablet by mouth 2 (two) times daily., Disp: , Rfl:    Upadacitinib ER (RINVOQ) 15 MG TB24, Take 1 tablet (15 mg) by mouth daily., Disp: 30 tablet, Rfl: 3   fluticasone (FLONASE) 50 MCG/ACT nasal spray, Place 2 sprays into both nostrils daily. (Patient not taking: Reported on 05/11/2021), Disp: 16 g, Rfl: 6   Insulin Pen Needle (PEN NEEDLES) 31G X 6 MM MISC, 1 each by Does not apply route daily. (Patient not taking: Reported on 08/30/2021), Disp: 50 each, Rfl: prn Medication Side Effects: none  Family Medical/ Social History: Changes?  Wife had surgery for colorectal cancer about a month ago. Is 100% cancer-free!    MENTAL HEALTH EXAM:  There were no vitals taken for this visit.There is no height or weight on file to calculate BMI.  General Appearance:  unable to  assess  Eye Contact:   unable to assess  Speech:  Clear and Coherent and Normal Rate  Volume:  Normal  Mood:  Euthymic  Affect:   unable to assess  Thought Process:  Goal Directed and Descriptions of Associations: Intact  Orientation:  Full (Time, Place, and Person)  Thought Content: Logical   Suicidal Thoughts:  No  Homicidal Thoughts:  No  Memory:  WNL  Judgement:  Good  Insight:  Good  Psychomotor Activity:   unable to assess  Concentration:  Concentration: Good and Attention Span: Good  Recall:  Good  Fund of Knowledge: Good  Language: Good  Assets:  Desire for Improvement  ADL's:  Intact  Cognition: WNL  Prognosis:  Good   Labs 05/06/2021 reviewed.  PCP follows.  DIAGNOSES:    ICD-10-CM   1. Attention deficit hyperactivity disorder (ADHD), combined type  F90.2     2. Migraine without status migrainosus, not intractable, unspecified migraine type  G43.909     3. Bipolar I disorder ([place])  F31.9     4. Generalized anxiety disorder  F41.1     5. Insomnia, unspecified type  G47.00  6. Restless leg syndrome  G25.81      Receiving Psychotherapy: No   RECOMMENDATIONS:  PDMP was reviewed. Last Vyvanse filled 09/01/2021. I provided 20 minutes of non-face-to-face time during this encounter, including time spent before and after the visit in records review, medical decision making, counseling pertinent to today's visit, and charting.   She is doing well so no change needed.  Continue amitriptyline 50 mg, 1 p.o. nightly for migraine prevention. Continue Abilify 15 mg, 1 p.o. daily. Continue gabapentin 300 mg, 1 p.o. nightly. Continue Vyvanse 40 mg p.o. every morning. Continue Seroquel 50 mg, 3 p.o. nightly.   Return in 6 months.  Donnal Moat, PA-C

## 2021-10-06 ENCOUNTER — Encounter: Payer: Self-pay | Admitting: Family Medicine

## 2021-10-07 ENCOUNTER — Other Ambulatory Visit (HOSPITAL_COMMUNITY): Payer: Self-pay

## 2021-10-07 MED ORDER — MUPIROCIN 2 % EX OINT
1.0000 | TOPICAL_OINTMENT | Freq: Two times a day (BID) | CUTANEOUS | 1 refills | Status: DC
  Filled 2021-10-07: qty 22, 11d supply, fill #0

## 2021-10-14 ENCOUNTER — Other Ambulatory Visit (HOSPITAL_COMMUNITY): Payer: Self-pay

## 2021-10-14 ENCOUNTER — Encounter: Payer: Self-pay | Admitting: Pharmacist

## 2021-10-14 NOTE — Telephone Encounter (Signed)
Error

## 2021-10-18 ENCOUNTER — Encounter (INDEPENDENT_AMBULATORY_CARE_PROVIDER_SITE_OTHER): Payer: BC Managed Care – PPO | Admitting: Family Medicine

## 2021-10-18 DIAGNOSIS — B009 Herpesviral infection, unspecified: Secondary | ICD-10-CM | POA: Diagnosis not present

## 2021-10-18 MED ORDER — VALACYCLOVIR HCL 1 G PO TABS
1000.0000 mg | ORAL_TABLET | Freq: Every day | ORAL | 1 refills | Status: DC
  Filled 2021-10-19: qty 30, 30d supply, fill #0

## 2021-10-18 NOTE — Addendum Note (Signed)
Addended by: Lamar Blinks C on: 10/18/2021 10:56 AM   Modules accepted: Orders

## 2021-10-18 NOTE — Telephone Encounter (Signed)
Please see the MyChart message reply(ies) for my assessment and plan.  The patient gave consent for this Medical Advice Message and is aware that it may result in a bill to their insurance company as well as the possibility that this may result in a co-payment or deductible. They are an established patient, but are not seeking medical advice exclusively about a problem treated during an in person or video visit in the last 7 days. I did not recommend an in person or video visit within 7 days of my reply.  I spent a total of7 minutes cumulative time within 7 days through MyChart messaging Sheily Lineman, MD  

## 2021-10-19 ENCOUNTER — Other Ambulatory Visit (HOSPITAL_COMMUNITY): Payer: Self-pay

## 2021-10-20 ENCOUNTER — Other Ambulatory Visit (HOSPITAL_COMMUNITY): Payer: Self-pay

## 2021-10-27 ENCOUNTER — Other Ambulatory Visit: Payer: Self-pay | Admitting: Internal Medicine

## 2021-10-27 ENCOUNTER — Other Ambulatory Visit: Payer: Self-pay | Admitting: Physician Assistant

## 2021-10-27 DIAGNOSIS — M0600 Rheumatoid arthritis without rheumatoid factor, unspecified site: Secondary | ICD-10-CM

## 2021-10-27 MED FILL — Omeprazole Cap Delayed Release 40 MG: ORAL | 90 days supply | Qty: 180 | Fill #2 | Status: AC

## 2021-10-27 NOTE — Telephone Encounter (Signed)
Next Visit: Due around 11/30/2021. Message sent to the front to schedule.  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 Patient states she will be in for lab work tomorrow.  Okay to refill Plaquenil?

## 2021-10-27 NOTE — Telephone Encounter (Signed)
LMOM for patient to call and schedule follow-up appointment.   °

## 2021-10-28 ENCOUNTER — Other Ambulatory Visit (HOSPITAL_COMMUNITY): Payer: Self-pay

## 2021-10-28 MED ORDER — HYDROXYCHLOROQUINE SULFATE 200 MG PO TABS
200.0000 mg | ORAL_TABLET | Freq: Two times a day (BID) | ORAL | 2 refills | Status: DC
  Filled 2021-10-28: qty 60, 30d supply, fill #0
  Filled 2021-11-12 – 2021-11-25 (×2): qty 60, 30d supply, fill #1
  Filled 2022-01-08: qty 60, 30d supply, fill #2

## 2021-10-29 ENCOUNTER — Other Ambulatory Visit (HOSPITAL_COMMUNITY): Payer: Self-pay

## 2021-10-29 MED ORDER — LISDEXAMFETAMINE DIMESYLATE 40 MG PO CAPS: 40.0000 mg | ORAL_CAPSULE | ORAL | 0 refills | Status: DC

## 2021-10-29 MED ORDER — LISDEXAMFETAMINE DIMESYLATE 40 MG PO CAPS
40.0000 mg | ORAL_CAPSULE | ORAL | 0 refills | Status: DC
  Filled 2021-10-30: qty 30, 30d supply, fill #0

## 2021-10-30 ENCOUNTER — Other Ambulatory Visit (HOSPITAL_COMMUNITY): Payer: Self-pay

## 2021-11-01 ENCOUNTER — Other Ambulatory Visit (HOSPITAL_COMMUNITY): Payer: Self-pay

## 2021-11-01 ENCOUNTER — Other Ambulatory Visit: Payer: Self-pay

## 2021-11-01 ENCOUNTER — Telehealth: Payer: Self-pay | Admitting: Physician Assistant

## 2021-11-01 MED ORDER — AMPHETAMINE-DEXTROAMPHET ER 30 MG PO CP24
30.0000 mg | ORAL_CAPSULE | Freq: Every day | ORAL | 0 refills | Status: DC
  Filled 2021-12-04: qty 30, 30d supply, fill #0

## 2021-11-01 MED ORDER — AMPHETAMINE-DEXTROAMPHET ER 30 MG PO CP24
30.0000 mg | ORAL_CAPSULE | Freq: Every day | ORAL | 0 refills | Status: DC
  Filled 2022-01-08: qty 30, 30d supply, fill #0

## 2021-11-01 MED ORDER — AMPHETAMINE-DEXTROAMPHET ER 30 MG PO CP24
30.0000 mg | ORAL_CAPSULE | Freq: Every day | ORAL | 0 refills | Status: DC
  Filled 2021-11-01: qty 30, 30d supply, fill #0

## 2021-11-01 NOTE — Telephone Encounter (Signed)
Pt called and said that the pharmacy can't get the  vyvanse generic in stock. So she wants to go back on the adderall 110 mg because they do have the adderalll. Please cancel all scripts and send in three new adderall scripts to [facility]

## 2021-11-01 NOTE — Telephone Encounter (Signed)
Pended.

## 2021-11-02 ENCOUNTER — Other Ambulatory Visit (HOSPITAL_COMMUNITY): Payer: Self-pay

## 2021-11-05 ENCOUNTER — Other Ambulatory Visit (HOSPITAL_COMMUNITY): Payer: Self-pay

## 2021-11-06 ENCOUNTER — Other Ambulatory Visit (HOSPITAL_COMMUNITY): Payer: Self-pay

## 2021-11-12 ENCOUNTER — Other Ambulatory Visit: Payer: Self-pay | Admitting: Internal Medicine

## 2021-11-12 ENCOUNTER — Other Ambulatory Visit: Payer: Self-pay | Admitting: Physician Assistant

## 2021-11-12 ENCOUNTER — Other Ambulatory Visit (HOSPITAL_COMMUNITY): Payer: Self-pay

## 2021-11-12 DIAGNOSIS — G43909 Migraine, unspecified, not intractable, without status migrainosus: Secondary | ICD-10-CM

## 2021-11-12 DIAGNOSIS — M0600 Rheumatoid arthritis without rheumatoid factor, unspecified site: Secondary | ICD-10-CM

## 2021-11-13 ENCOUNTER — Other Ambulatory Visit (HOSPITAL_COMMUNITY): Payer: Self-pay

## 2021-11-15 ENCOUNTER — Encounter (HOSPITAL_COMMUNITY): Payer: Self-pay

## 2021-11-15 ENCOUNTER — Other Ambulatory Visit (HOSPITAL_COMMUNITY): Payer: Self-pay

## 2021-11-15 MED ORDER — QUETIAPINE FUMARATE 50 MG PO TABS
150.0000 mg | ORAL_TABLET | Freq: Every day | ORAL | 0 refills | Status: DC
  Filled 2021-11-15: qty 270, 90d supply, fill #0

## 2021-11-15 MED ORDER — AMITRIPTYLINE HCL 50 MG PO TABS
50.0000 mg | ORAL_TABLET | Freq: Every evening | ORAL | 1 refills | Status: DC
  Filled 2021-11-15: qty 90, 90d supply, fill #0
  Filled 2022-02-13: qty 90, 90d supply, fill #1

## 2021-11-16 ENCOUNTER — Other Ambulatory Visit (HOSPITAL_COMMUNITY): Payer: Self-pay

## 2021-11-17 ENCOUNTER — Other Ambulatory Visit (HOSPITAL_COMMUNITY): Payer: Self-pay

## 2021-11-17 DIAGNOSIS — Z79899 Other long term (current) drug therapy: Secondary | ICD-10-CM | POA: Diagnosis not present

## 2021-11-17 DIAGNOSIS — M0600 Rheumatoid arthritis without rheumatoid factor, unspecified site: Secondary | ICD-10-CM | POA: Diagnosis not present

## 2021-11-18 ENCOUNTER — Encounter: Payer: Self-pay | Admitting: Internal Medicine

## 2021-11-18 ENCOUNTER — Other Ambulatory Visit (HOSPITAL_COMMUNITY): Payer: Self-pay

## 2021-11-18 ENCOUNTER — Other Ambulatory Visit: Payer: Self-pay | Admitting: Internal Medicine

## 2021-11-18 DIAGNOSIS — M0600 Rheumatoid arthritis without rheumatoid factor, unspecified site: Secondary | ICD-10-CM

## 2021-11-18 LAB — CBC WITH DIFFERENTIAL/PLATELET
Absolute Monocytes: 432 cells/uL (ref 200–950)
Basophils Absolute: 32 cells/uL (ref 0–200)
Basophils Relative: 0.6 %
Eosinophils Absolute: 113 cells/uL (ref 15–500)
Eosinophils Relative: 2.1 %
HCT: 39.3 % (ref 35.0–45.0)
Hemoglobin: 13.4 g/dL (ref 11.7–15.5)
Lymphs Abs: 1604 cells/uL (ref 850–3900)
MCH: 33 pg (ref 27.0–33.0)
MCHC: 34.1 g/dL (ref 32.0–36.0)
MCV: 96.8 fL (ref 80.0–100.0)
MPV: 10 fL (ref 7.5–12.5)
Monocytes Relative: 8 %
Neutro Abs: 3218 cells/uL (ref 1500–7800)
Neutrophils Relative %: 59.6 %
Platelets: 315 10*3/uL (ref 140–400)
RBC: 4.06 10*6/uL (ref 3.80–5.10)
RDW: 11.7 % (ref 11.0–15.0)
Total Lymphocyte: 29.7 %
WBC: 5.4 10*3/uL (ref 3.8–10.8)

## 2021-11-18 LAB — COMPLETE METABOLIC PANEL WITH GFR
AG Ratio: 1.7 (calc) (ref 1.0–2.5)
ALT: 26 U/L (ref 6–29)
AST: 27 U/L (ref 10–35)
Albumin: 4.5 g/dL (ref 3.6–5.1)
Alkaline phosphatase (APISO): 77 U/L (ref 37–153)
BUN: 9 mg/dL (ref 7–25)
CO2: 28 mmol/L (ref 20–32)
Calcium: 9.8 mg/dL (ref 8.6–10.4)
Chloride: 101 mmol/L (ref 98–110)
Creat: 0.9 mg/dL (ref 0.50–1.03)
Globulin: 2.7 g/dL (calc) (ref 1.9–3.7)
Glucose, Bld: 72 mg/dL (ref 65–99)
Potassium: 4.4 mmol/L (ref 3.5–5.3)
Sodium: 138 mmol/L (ref 135–146)
Total Bilirubin: 0.6 mg/dL (ref 0.2–1.2)
Total Protein: 7.2 g/dL (ref 6.1–8.1)
eGFR: 78 mL/min/{1.73_m2} (ref >=60)

## 2021-11-18 LAB — SEDIMENTATION RATE: Sed Rate: 2 mm/h (ref 0–20)

## 2021-11-18 MED ORDER — DICLOFENAC SODIUM 25 MG PO TBEC
25.0000 mg | DELAYED_RELEASE_TABLET | Freq: Two times a day (BID) | ORAL | 5 refills | Status: DC | PRN
  Filled 2021-11-18: qty 60, 30d supply, fill #0
  Filled 2021-12-23: qty 60, 30d supply, fill #1
  Filled 2022-01-08 (×2): qty 60, 30d supply, fill #2

## 2021-11-18 NOTE — Telephone Encounter (Signed)
Next Visit:  Due around 11/30/2021. 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

## 2021-11-18 NOTE — Telephone Encounter (Signed)
Please schedule patient a follow up visit. Patient due November 2023. Thanks!  

## 2021-11-19 ENCOUNTER — Other Ambulatory Visit (HOSPITAL_COMMUNITY): Payer: Self-pay

## 2021-11-22 ENCOUNTER — Other Ambulatory Visit (HOSPITAL_COMMUNITY): Payer: Self-pay

## 2021-11-22 NOTE — Progress Notes (Unsigned)
[facility] at [facility] [address] [place], [place] [address] [phone]  Date:  11/24/2021   Name:  Grace Butler   DOB:  03/26/1970   MRN:  [id number]  PCP:  Pearline Cables, MD    Chief Complaint: No chief complaint on file.   History of Present Illness:  Grace Butler is a 51 y.o. very pleasant female patient who presents with the following:  Patient seen today with concern of right breast pain Most recent visit with myself was in May of this year History of RA, mood disorder, ADD, hypothyroisidm    Patient Active Problem List   Diagnosis Date Noted   Acute non-recurrent pansinusitis 03/09/2021   High risk medication use 07/29/2020   Bilateral knee pain 07/29/2020   Attention deficit hyperactivity disorder (ADHD) 09/28/2017   Insomnia, unspecified 09/28/2017   Acute meniscal tear of knee, left, subsequent encounter 02/10/2017   Left knee pain 09/01/2016   Obesity 01/14/2016   Rheumatoid arthritis (HCC) 01/14/2016   Hypothyroidism due to acquired atrophy of thyroid 01/14/2016   Osteopenia 09/15/2015   Bipolar 1 disorder (HCC) 08/21/2015   Gastroesophageal reflux disease without esophagitis 08/21/2015   Migraine 08/21/2015    Past Medical History:  Diagnosis Date   Bipolar 1 disorder (HCC)    Chronic headache    Depression    Gallstone    Hypertension    IBS (irritable bowel syndrome)    Obesity    Rheumatoid arthritis (HCC)    Thyroid disease    Ulcer     Past Surgical History:  Procedure Laterality Date   ABDOMINAL HYSTERECTOMY     APPENDECTOMY     CHOLECYSTECTOMY     COLONOSCOPY  09/07/2018   CYSTECTOMY     dermoid   ESOPHAGOGASTRODUODENOSCOPY     about 20 years ago to check for ulcers   KNEE ARTHROSCOPY Left 2019   MYOMECTOMY     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 78   UPPER GASTROINTESTINAL ENDOSCOPY      Social History   Tobacco Use   Smoking status: Former    Packs/day: 0.50     Years: 20.00    Total pack years: 10.00    Types: Cigarettes    Quit date: 01/16/2014    Years since quitting: 7.8   Smokeless tobacco: Never  Vaping Use   Vaping Use: Some days   Substances: Nicotine  Substance Use Topics   Alcohol use: Not Currently    Comment: Rarely   Drug use: No    Family History  Problem Relation Age of Onset   Hypertension Mother    Heart attack Father    Healthy Sister    Healthy Sister    Healthy Sister    Healthy Brother    Healthy Son    Rheum arthritis Paternal Uncle    Colon cancer Neg Hx    Esophageal cancer Neg Hx    Colon polyps Neg Hx    Rectal cancer Neg Hx    Stomach cancer Neg Hx     Allergies  Allergen Reactions   Coconut (Cocos Nucifera) Anaphylaxis   Codeine    Sulfa Antibiotics Hives   Trazodone And Nefazodone     Vivid dreams    Medication list has been reviewed and updated.  Current Outpatient Medications on File Prior to Visit  Medication Sig Dispense Refill   amitriptyline (ELAVIL) 50 MG tablet Take 1 tablet (  50 mg total) by mouth every evening. 90 tablet 1   amphetamine-dextroamphetamine (ADDERALL XR) 30 MG 24 hr capsule Take 1 capsule (30 mg total) by mouth daily. 30 capsule 0   [START ON 11/30/2021] amphetamine-dextroamphetamine (ADDERALL XR) 30 MG 24 hr capsule Take 1 capsule (30 mg total) by mouth daily. 30 capsule 0   [START ON 12/29/2021] amphetamine-dextroamphetamine (ADDERALL XR) 30 MG 24 hr capsule Take 1 capsule (30 mg total) by mouth daily. 30 capsule 0   ARIPiprazole (ABILIFY) 15 MG tablet TAKE 1 TABLET BY MOUTH EACH DAY 90 tablet 1   Ascorbic Acid (VITAMIN C) 100 MG tablet Take 100 mg by mouth daily.     butalbital-acetaminophen-caffeine (FIORICET) 50-325-40 MG tablet Take 1-2 tablets by mouth every 6 (six) hours as needed for headache. Max 6 per 24 hours 14 tablet 0   calcium citrate-vitamin D (CITRACAL+D) 315-200 MG-UNIT tablet Take 1 tablet by mouth 2 (two) times daily.     cholecalciferol (VITAMIN D)  1000 UNITS tablet Take 1,000 Units by mouth daily.     diclofenac (VOLTAREN) 25 MG EC tablet Take 1 tablet (25 mg total) by mouth 2 (two) times daily as needed. 60 tablet 5   fluticasone (FLONASE) 50 MCG/ACT nasal spray Place 2 sprays into both nostrils daily. (Patient not taking: Reported on 05/11/2021) 16 g 6   gabapentin (NEURONTIN) 300 MG capsule Take 1 capsule (300 mg total) by mouth at bedtime. 90 capsule 0   hydroxychloroquine (PLAQUENIL) 200 MG tablet Take 1 tablet by mouth 2 times daily. 60 tablet 2   Insulin Pen Needle (PEN NEEDLES) 31G X 6 MM MISC 1 each by Does not apply route daily. (Patient not taking: Reported on 08/30/2021) 50 each prn   levothyroxine (SYNTHROID) 150 MCG tablet Take 1 tablet by mouth daily before breakfast. 30 tablet 3   lisdexamfetamine (VYVANSE) 40 MG capsule Take 1 capsule (40 mg total) by mouth every morning. 30 capsule 0   lisdexamfetamine (VYVANSE) 40 MG capsule Take 1 capsule (40 mg total) by mouth every morning. 30 capsule 0   [START ON 11/29/2021] lisdexamfetamine (VYVANSE) 40 MG capsule Take 1 capsule (40 mg total) by mouth every morning. 30 capsule 0   lisinopril (ZESTRIL) 10 MG tablet Take 1 tablet (10 mg total) by mouth daily. 30 tablet 0   mupirocin ointment (BACTROBAN) 2 % Apply to affected area 2 times a day as needed for boils 22 g 1   omeprazole (PRILOSEC) 40 MG capsule TAKE 1 CAPSULE BY MOUTH 2 TIMES DAILY FOR ACID REFLUX 180 capsule 3   QUEtiapine (SEROQUEL) 50 MG tablet Take 3 tablets by mouth at bedtime. 270 tablet 0   rizatriptan (MAXALT-MLT) 10 MG disintegrating tablet Take 1 tablet (10 mg total) by mouth as needed for migraine. May repeat in 2 hours if needed 10 tablet 0   Specialty Vitamins Products (MAGNESIUM, AMINO ACID CHELATE,) 133 MG tablet Take 1 tablet by mouth 2 (two) times daily.     Upadacitinib ER (RINVOQ) 15 MG TB24 Take 1 tablet (15 mg) by mouth daily. 30 tablet 3   valACYclovir (VALTREX) 1000 MG tablet Take 1 tablet (1,000 mg  total) by mouth daily. Take for 5 days for outbreak as needed 30 tablet 1   No current facility-administered medications on file prior to visit.    Review of Systems:  As per HPI- otherwise negative.   Physical Examination: There were no vitals filed for this visit. There were no vitals filed for this visit.  There is no height or weight on file to calculate BMI. Ideal Body Weight:    GEN: no acute distress. HEENT: Atraumatic, Normocephalic.  Ears and Nose: No external deformity. CV: RRR, No M/G/R. No JVD. No thrill. No extra heart sounds. PULM: CTA B, no wheezes, crackles, rhonchi. No retractions. No resp. distress. No accessory muscle use. ABD: S, NT, ND, +BS. No rebound. No HSM. EXTR: No c/c/e PSYCH: Normally interactive. Conversant.    Assessment and Plan: ***  Signed Lamar Blinks, MD

## 2021-11-23 ENCOUNTER — Other Ambulatory Visit (HOSPITAL_COMMUNITY): Payer: Self-pay

## 2021-11-24 ENCOUNTER — Ambulatory Visit: Payer: BC Managed Care – PPO | Admitting: Family Medicine

## 2021-11-24 ENCOUNTER — Other Ambulatory Visit (HOSPITAL_COMMUNITY): Payer: Self-pay

## 2021-11-24 VITALS — BP 120/80 | HR 70 | Temp 97.7°F | Resp 18 | Ht 69.0 in | Wt 336.4 lb

## 2021-11-24 DIAGNOSIS — N644 Mastodynia: Secondary | ICD-10-CM

## 2021-11-24 NOTE — Patient Instructions (Signed)
We will get you set up for a right breast ultrasound asap

## 2021-11-26 ENCOUNTER — Other Ambulatory Visit (HOSPITAL_COMMUNITY): Payer: Self-pay

## 2021-12-04 ENCOUNTER — Other Ambulatory Visit (HOSPITAL_COMMUNITY): Payer: Self-pay

## 2021-12-07 ENCOUNTER — Other Ambulatory Visit: Payer: Self-pay | Admitting: Family Medicine

## 2021-12-07 DIAGNOSIS — N644 Mastodynia: Secondary | ICD-10-CM

## 2021-12-08 ENCOUNTER — Other Ambulatory Visit: Payer: BC Managed Care – PPO

## 2021-12-16 ENCOUNTER — Other Ambulatory Visit: Payer: Self-pay

## 2021-12-23 ENCOUNTER — Other Ambulatory Visit: Payer: Self-pay | Admitting: Physician Assistant

## 2021-12-23 ENCOUNTER — Other Ambulatory Visit (HOSPITAL_COMMUNITY): Payer: Self-pay

## 2021-12-23 ENCOUNTER — Other Ambulatory Visit: Payer: Self-pay | Admitting: Family Medicine

## 2021-12-23 DIAGNOSIS — I10 Essential (primary) hypertension: Secondary | ICD-10-CM

## 2021-12-23 MED ORDER — GABAPENTIN 300 MG PO CAPS
300.0000 mg | ORAL_CAPSULE | Freq: Every day | ORAL | 0 refills | Status: DC
  Filled 2021-12-23: qty 90, 90d supply, fill #0

## 2021-12-23 MED ORDER — LISINOPRIL 10 MG PO TABS
10.0000 mg | ORAL_TABLET | Freq: Every day | ORAL | 0 refills | Status: DC
  Filled 2021-12-23: qty 90, 90d supply, fill #0

## 2021-12-24 ENCOUNTER — Other Ambulatory Visit: Payer: Self-pay

## 2021-12-24 ENCOUNTER — Other Ambulatory Visit (HOSPITAL_COMMUNITY): Payer: Self-pay

## 2021-12-28 ENCOUNTER — Other Ambulatory Visit (HOSPITAL_COMMUNITY): Payer: Self-pay

## 2021-12-28 ENCOUNTER — Other Ambulatory Visit: Payer: Self-pay

## 2022-01-08 ENCOUNTER — Other Ambulatory Visit (HOSPITAL_COMMUNITY): Payer: Self-pay

## 2022-01-10 ENCOUNTER — Other Ambulatory Visit (HOSPITAL_COMMUNITY): Payer: Self-pay

## 2022-01-11 ENCOUNTER — Other Ambulatory Visit: Payer: Self-pay | Admitting: Physician Assistant

## 2022-01-11 ENCOUNTER — Other Ambulatory Visit (HOSPITAL_COMMUNITY): Payer: Self-pay

## 2022-01-11 MED ORDER — ARIPIPRAZOLE 15 MG PO TABS
15.0000 mg | ORAL_TABLET | Freq: Every day | ORAL | 0 refills | Status: DC
  Filled 2022-01-11: qty 90, 90d supply, fill #0

## 2022-01-12 ENCOUNTER — Other Ambulatory Visit (HOSPITAL_COMMUNITY): Payer: Self-pay

## 2022-01-12 ENCOUNTER — Other Ambulatory Visit: Payer: Self-pay

## 2022-01-14 ENCOUNTER — Telehealth: Payer: Self-pay | Admitting: Pharmacist

## 2022-01-14 NOTE — Telephone Encounter (Signed)
Per [employer] portal, PA for Rinvoq set to expire Submitted a Prior Authorization request to [facility] for RINVOQ via CoverMyMeds. Will update once we receive a response.  Key: [id number]  Knox Saliva, PharmD, MPH, BCPS, CPP Clinical Pharmacist (Rheumatology and Pulmonology)

## 2022-01-17 ENCOUNTER — Other Ambulatory Visit (HOSPITAL_COMMUNITY): Payer: Self-pay

## 2022-01-18 ENCOUNTER — Encounter: Payer: Self-pay | Admitting: Family Medicine

## 2022-01-18 ENCOUNTER — Other Ambulatory Visit (HOSPITAL_COMMUNITY): Payer: Self-pay

## 2022-01-18 ENCOUNTER — Other Ambulatory Visit: Payer: Self-pay | Admitting: Internal Medicine

## 2022-01-18 DIAGNOSIS — Z79899 Other long term (current) drug therapy: Secondary | ICD-10-CM

## 2022-01-18 DIAGNOSIS — M0600 Rheumatoid arthritis without rheumatoid factor, unspecified site: Secondary | ICD-10-CM

## 2022-01-18 NOTE — Telephone Encounter (Signed)
Received notification from [facility] regarding a prior authorization for [facility]. Authorization has been APPROVED from  01/14/2022 through 01/13/2023.  Patient can continue to fill through [facility]: [id number]   Authorization # Burna Cash, PharmD, MPH, BCPS, CPP Clinical Pharmacist (Rheumatology and Pulmonology)

## 2022-01-18 NOTE — Telephone Encounter (Signed)
Please schedule patient a follow up visit. Patient was due October 2023. Thanks!

## 2022-01-18 NOTE — Telephone Encounter (Signed)
Next Visit: Due October 2023. Message sent to the front to schedule.   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

## 2022-01-19 ENCOUNTER — Other Ambulatory Visit (HOSPITAL_COMMUNITY): Payer: Self-pay

## 2022-01-19 MED ORDER — RINVOQ 15 MG PO TB24
15.0000 mg | ORAL_TABLET | Freq: Every day | ORAL | 1 refills | Status: DC
  Filled 2022-01-19 – 2022-02-02 (×2): qty 30, 30d supply, fill #0
  Filled 2022-02-22: qty 30, 30d supply, fill #1

## 2022-01-23 ENCOUNTER — Other Ambulatory Visit: Payer: Self-pay | Admitting: Physician Assistant

## 2022-01-23 ENCOUNTER — Other Ambulatory Visit: Payer: Self-pay | Admitting: Family Medicine

## 2022-01-23 DIAGNOSIS — I10 Essential (primary) hypertension: Secondary | ICD-10-CM

## 2022-01-23 MED FILL — Omeprazole Cap Delayed Release 40 MG: ORAL | 30 days supply | Qty: 60 | Fill #3 | Status: AC

## 2022-01-24 ENCOUNTER — Other Ambulatory Visit (HOSPITAL_COMMUNITY): Payer: Self-pay

## 2022-01-24 MED ORDER — LISINOPRIL 10 MG PO TABS
10.0000 mg | ORAL_TABLET | Freq: Every day | ORAL | 0 refills | Status: DC
  Filled 2022-01-24: qty 90, 90d supply, fill #0

## 2022-01-25 ENCOUNTER — Other Ambulatory Visit (HOSPITAL_COMMUNITY): Payer: Self-pay

## 2022-01-25 ENCOUNTER — Other Ambulatory Visit: Payer: Self-pay

## 2022-01-26 ENCOUNTER — Other Ambulatory Visit (HOSPITAL_COMMUNITY): Payer: Self-pay

## 2022-01-28 ENCOUNTER — Other Ambulatory Visit (HOSPITAL_COMMUNITY): Payer: Self-pay

## 2022-01-31 ENCOUNTER — Encounter: Payer: BC Managed Care – PPO | Admitting: Family Medicine

## 2022-01-31 ENCOUNTER — Other Ambulatory Visit: Payer: Self-pay | Admitting: Internal Medicine

## 2022-01-31 ENCOUNTER — Other Ambulatory Visit (HOSPITAL_COMMUNITY): Payer: Self-pay

## 2022-01-31 DIAGNOSIS — M0600 Rheumatoid arthritis without rheumatoid factor, unspecified site: Secondary | ICD-10-CM

## 2022-01-31 MED ORDER — HYDROXYCHLOROQUINE SULFATE 200 MG PO TABS
200.0000 mg | ORAL_TABLET | Freq: Two times a day (BID) | ORAL | 2 refills | Status: DC
  Filled 2022-02-15 (×2): qty 60, 30d supply, fill #0
  Filled 2022-03-18: qty 60, 30d supply, fill #1
  Filled 2022-04-21: qty 60, 30d supply, fill #2

## 2022-01-31 MED ORDER — ZOSTER VAC RECOMB ADJUVANTED 50 MCG/0.5ML IM SUSR
0.5000 mL | Freq: Once | INTRAMUSCULAR | 0 refills | Status: DC
  Filled 2022-01-31: qty 0.5, 1d supply, fill #0

## 2022-01-31 MED ORDER — ZOSTER VAC RECOMB ADJUVANTED 50 MCG/0.5ML IM SUSR
0.5000 mL | INTRAMUSCULAR | 0 refills | Status: DC
  Filled 2022-04-01: qty 0.5, fill #0

## 2022-01-31 NOTE — Telephone Encounter (Signed)
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

## 2022-02-02 ENCOUNTER — Other Ambulatory Visit (HOSPITAL_COMMUNITY): Payer: Self-pay

## 2022-02-03 ENCOUNTER — Other Ambulatory Visit (HOSPITAL_COMMUNITY): Payer: Self-pay

## 2022-02-03 NOTE — Progress Notes (Signed)
Grace Butler at [facility] [address] [place], [place] [address] [phone]  Date:  02/07/2022   Name:  Grace Butler   DOB:  01-06-1970   MRN:  [id number]  PCP:  Darreld Mclean, MD    Chief Complaint: Annual Exam (Pt states not fasting )   History of Present Illness:  Grace Butler is a 52 y.o. very pleasant female patient who presents with the following:  Pt seen today for a CPE Last seen by myself on November for breast pain- this did resolve  History of RA, mood disorder, ADD, hypothyroisidm    Her partner is completing her cancer treatment and thankfully doing well.  She did recently have chickenpox however!  [name]'s 68 year old son developed shingles, we think this is where her partner got the chickenpox  Covid booster recommended Hep C screening can be done- update today  Pap smear; s/p hyst at age 48 for dermoid cyst and other benign disease.  She has not had a Pap smear in many years.  We discussed potentially doing a Pap of the vaginal cuff, she declines at this time Flu shot- declines   She did one dose of shingrix so far -she notes she did not have a systemic allergic reaction but had a localized reaction with some swelling and itching of her arm.  I advised her she does not have to go back to the second dose that she does not wish to.  If she does choose to get the second dose would recommend pretreatment with ibuprofen and Benadryl  We did some labs in November - CMP, CBC  Patient Active Problem List   Diagnosis Date Noted   Acute non-recurrent pansinusitis 03/09/2021   High risk medication use 07/29/2020   Bilateral knee pain 07/29/2020   Attention deficit hyperactivity disorder (ADHD) 09/28/2017   Insomnia, unspecified 09/28/2017   Acute meniscal tear of knee, left, subsequent encounter 02/10/2017   Left knee pain 09/01/2016   Obesity 01/14/2016   Rheumatoid arthritis ([place]) 01/14/2016   Hypothyroidism  due to acquired atrophy of thyroid 01/14/2016   Osteopenia 09/15/2015   Bipolar 1 disorder ([place]) 08/21/2015   Gastroesophageal reflux disease without esophagitis 08/21/2015   Migraine 08/21/2015    Past Medical History:  Diagnosis Date   Bipolar 1 disorder ([place])    Chronic headache    Depression    Gallstone    Hypertension    IBS (irritable bowel syndrome)    Obesity    Rheumatoid arthritis ([place])    Thyroid disease    Ulcer     Past Surgical History:  Procedure Laterality Date   ABDOMINAL HYSTERECTOMY     APPENDECTOMY     CHOLECYSTECTOMY     COLONOSCOPY  09/07/2018   CYSTECTOMY     dermoid   ESOPHAGOGASTRODUODENOSCOPY     about 20 years ago to check for ulcers   KNEE ARTHROSCOPY Left 2019   MYOMECTOMY     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 71   UPPER GASTROINTESTINAL ENDOSCOPY      Social History   Tobacco Use   Smoking status: Former    Packs/day: 0.50    Years: 20.00    Total pack years: 10.00    Types: Cigarettes    Quit date: 01/16/2014    Years since quitting: 8.0   Smokeless tobacco: Never  Vaping Use   Vaping Use: Some days   Substances: Nicotine  Substance  Use Topics   Alcohol use: Not Currently    Comment: Rarely   Drug use: No    Family History  Problem Relation Age of Onset   Hypertension Mother    Heart attack Father    Healthy Sister    Healthy Sister    Healthy Sister    Healthy Brother    Healthy Son    Rheum arthritis Paternal Uncle    Colon cancer Neg Hx    Esophageal cancer Neg Hx    Colon polyps Neg Hx    Rectal cancer Neg Hx    Stomach cancer Neg Hx     Allergies  Allergen Reactions   Coconut (Cocos Nucifera) Anaphylaxis   Codeine    Sulfa Antibiotics Hives   Trazodone And Nefazodone     Vivid dreams    Medication list has been reviewed and updated.  Current Outpatient Medications on File Prior to Visit  Medication Sig Dispense Refill   amitriptyline (ELAVIL) 50 MG tablet Take 1 tablet (50 mg total) by mouth every  evening. 90 tablet 1   amphetamine-dextroamphetamine (ADDERALL XR) 30 MG 24 hr capsule Take 1 capsule (30 mg total) by mouth daily. 30 capsule 0   amphetamine-dextroamphetamine (ADDERALL XR) 30 MG 24 hr capsule Take 1 capsule (30 mg total) by mouth daily. 30 capsule 0   amphetamine-dextroamphetamine (ADDERALL XR) 30 MG 24 hr capsule Take 1 capsule (30 mg total) by mouth daily. 30 capsule 0   ARIPiprazole (ABILIFY) 15 MG tablet Take 1 tablet (15 mg total) by mouth daily. 90 tablet 0   Ascorbic Acid (VITAMIN C) 100 MG tablet Take 100 mg by mouth daily.     butalbital-acetaminophen-caffeine (FIORICET) 50-325-40 MG tablet Take 1-2 tablets by mouth every 6 (six) hours as needed for headache. Max 6 per 24 hours 14 tablet 0   calcium citrate-vitamin D (CITRACAL+D) 315-200 MG-UNIT tablet Take 1 tablet by mouth 2 (two) times daily.     cholecalciferol (VITAMIN D) 1000 UNITS tablet Take 1,000 Units by mouth daily.     diclofenac (VOLTAREN) 25 MG EC tablet Take 1 tablet (25 mg total) by mouth 2 (two) times daily as needed. 60 tablet 5   fluticasone (FLONASE) 50 MCG/ACT nasal spray Place 2 sprays into both nostrils daily. 16 g 6   gabapentin (NEURONTIN) 300 MG capsule Take 1 capsule (300 mg total) by mouth at bedtime. 90 capsule 0   hydroxychloroquine (PLAQUENIL) 200 MG tablet Take 1 tablet by mouth 2 times daily. 60 tablet 2   Insulin Pen Needle (PEN NEEDLES) 31G X 6 MM MISC 1 each by Does not apply route daily. 50 each prn   levothyroxine (SYNTHROID) 150 MCG tablet Take 1 tablet by mouth daily before breakfast. 30 tablet 3   lisdexamfetamine (VYVANSE) 40 MG capsule Take 1 capsule (40 mg total) by mouth every morning. 30 capsule 0   lisdexamfetamine (VYVANSE) 40 MG capsule Take 1 capsule (40 mg total) by mouth every morning. 30 capsule 0   lisdexamfetamine (VYVANSE) 40 MG capsule Take 1 capsule (40 mg total) by mouth every morning. 30 capsule 0   lisinopril (ZESTRIL) 10 MG tablet Take 1 tablet (10 mg total)  by mouth daily. 90 tablet 0   mupirocin ointment (BACTROBAN) 2 % Apply to affected area 2 times a day as needed for boils 22 g 1   omeprazole (PRILOSEC) 40 MG capsule TAKE 1 CAPSULE BY MOUTH 2 TIMES DAILY FOR ACID REFLUX 180 capsule 3   QUEtiapine (SEROQUEL)  50 MG tablet Take 3 tablets by mouth at bedtime. 270 tablet 0   rizatriptan (MAXALT-MLT) 10 MG disintegrating tablet Take 1 tablet (10 mg total) by mouth as needed for migraine. May repeat in 2 hours if needed 10 tablet 0   Specialty Vitamins Products (MAGNESIUM, AMINO ACID CHELATE,) 133 MG tablet Take 1 tablet by mouth 2 (two) times daily.     Upadacitinib ER (RINVOQ) 15 MG TB24 Take 1 tablet (15 mg) by mouth daily. 30 tablet 1   valACYclovir (VALTREX) 1000 MG tablet Take 1 tablet (1,000 mg total) by mouth daily. Take for 5 days for outbreak as needed 30 tablet 1   Zoster Vaccine Adjuvanted [facility]) injection Inject 0.5 mLs into the muscle. 0.5 mL 0   No current facility-administered medications on file prior to visit.    Review of Systems:  As per HPI- otherwise negative.   Physical Examination: Vitals:   02/07/22 1104  BP: 110/70  Pulse: 74  Resp: 18  Temp: 98.2 F (36.8 C)  SpO2: 99%   Vitals:   02/07/22 1104  Weight: (!) 337 lb 9.6 oz (153.1 kg)  Height: 5\' 9"  (1.753 m)   Body mass index is 49.85 kg/m. Ideal Body Weight: Weight in (lb) to have BMI = 25: 168.9  GEN: no acute distress.  Severe obesity, otherwise looks well HEENT: Atraumatic, Normocephalic.  Bilateral TM wnl, oropharynx normal.  PEERL,EOMI.   Ears and Nose: No external deformity. CV: RRR, No M/G/R. No JVD. No thrill. No extra heart sounds. PULM: CTA B, no wheezes, crackles, rhonchi. No retractions. No resp. distress. No accessory muscle use. ABD: S, NT, ND No rebound. No HSM. EXTR: No c/c/e PSYCH: Normally interactive. Conversant.    Assessment and Plan: Physical exam  Hypothyroidism due to acquired atrophy of thyroid - Plan: TSH,  levothyroxine (SYNTHROID) 150 MCG tablet, CANCELED: TSH  Class 3 severe obesity with body mass index (BMI) of 45.0 to 49.9 in adult, unspecified obesity type, unspecified whether serious comorbidity present ([place]) - Plan: CT CARDIAC SCORING (SELF PAY ONLY)  Encounter for hepatitis C screening test for low risk patient - Plan: Hepatitis C antibody  Screening for diabetes mellitus - Plan: Hemoglobin A1c  Screening, lipid - Plan: Lipid panel  Encounter for screening mammogram for malignant neoplasm of breast - Plan: MM 3D SCREEN BREAST BILATERAL  Essential hypertension - Plan: lisinopril (ZESTRIL) 10 MG tablet  Physical exam today- encouraged healthy diet and exercise routine Will plan further follow- up pending labs. Jamiesha is looking into a bariatric surgery office in [place].  They do some sort of gastric sleeve type surgery, she will given this information so I can check on it for her Ordered mammogram, coronary calcium score Blood pressure is well-controlled, continue lisinopril Checking TSH, refilled her levothyroxine  Signed Lamar Blinks, MD  Received labs as below, message to patient  Results for orders placed or performed in visit on 02/07/22  Hemoglobin A1c  Result Value Ref Range   Hgb A1c MFr Bld 5.7 4.6 - 6.5 %  Lipid panel  Result Value Ref Range   Cholesterol 228 (H) 0 - 200 mg/dL   Triglycerides 133.0 0.0 - 149.0 mg/dL   HDL 54.50 >39.00 mg/dL   VLDL 26.6 0.0 - 40.0 mg/dL   LDL Cholesterol 147 (H) 0 - 99 mg/dL   Total CHOL/HDL Ratio 4    NonHDL 173.69   TSH  Result Value Ref Range   TSH 10.69 (H) 0.35 - 5.50 uIU/mL

## 2022-02-03 NOTE — Patient Instructions (Addendum)
Good to see you today- I will be in touch with your labs Covid booster recommended at your pharmacy at your convenience Please stop at imaging on the ground floor to set up your mammo and coronary calcium Please send me the name of the weight loss surgery clinic and procedure you are looking into when you get a chance!

## 2022-02-04 ENCOUNTER — Other Ambulatory Visit: Payer: Self-pay | Admitting: Physician Assistant

## 2022-02-04 ENCOUNTER — Other Ambulatory Visit (HOSPITAL_COMMUNITY): Payer: Self-pay

## 2022-02-04 MED ORDER — AMPHETAMINE-DEXTROAMPHET ER 30 MG PO CP24
30.0000 mg | ORAL_CAPSULE | Freq: Every day | ORAL | 0 refills | Status: DC
  Filled 2022-02-07: qty 30, 30d supply, fill #0

## 2022-02-05 ENCOUNTER — Other Ambulatory Visit (HOSPITAL_COMMUNITY): Payer: Self-pay

## 2022-02-07 ENCOUNTER — Encounter: Payer: Self-pay | Admitting: Family Medicine

## 2022-02-07 ENCOUNTER — Other Ambulatory Visit (HOSPITAL_COMMUNITY): Payer: Self-pay

## 2022-02-07 ENCOUNTER — Other Ambulatory Visit: Payer: Self-pay

## 2022-02-07 ENCOUNTER — Ambulatory Visit (INDEPENDENT_AMBULATORY_CARE_PROVIDER_SITE_OTHER): Payer: BC Managed Care – PPO | Admitting: Family Medicine

## 2022-02-07 VITALS — BP 110/70 | HR 74 | Temp 98.2°F | Resp 18 | Ht 69.0 in | Wt 337.6 lb

## 2022-02-07 DIAGNOSIS — Z0000 Encounter for general adult medical examination without abnormal findings: Secondary | ICD-10-CM | POA: Diagnosis not present

## 2022-02-07 DIAGNOSIS — Z131 Encounter for screening for diabetes mellitus: Secondary | ICD-10-CM | POA: Diagnosis not present

## 2022-02-07 DIAGNOSIS — E034 Atrophy of thyroid (acquired): Secondary | ICD-10-CM

## 2022-02-07 DIAGNOSIS — Z1159 Encounter for screening for other viral diseases: Secondary | ICD-10-CM

## 2022-02-07 DIAGNOSIS — Z6841 Body Mass Index (BMI) 40.0 and over, adult: Secondary | ICD-10-CM

## 2022-02-07 DIAGNOSIS — Z1231 Encounter for screening mammogram for malignant neoplasm of breast: Secondary | ICD-10-CM

## 2022-02-07 DIAGNOSIS — Z13220 Encounter for screening for lipoid disorders: Secondary | ICD-10-CM

## 2022-02-07 DIAGNOSIS — I10 Essential (primary) hypertension: Secondary | ICD-10-CM

## 2022-02-07 LAB — LIPID PANEL
Cholesterol: 228 mg/dL — ABNORMAL HIGH (ref 0–200)
HDL: 54.5 mg/dL (ref >39.00)
LDL Cholesterol: 147 mg/dL — ABNORMAL HIGH (ref 0–99)
NonHDL: 173.69
Total CHOL/HDL Ratio: 4
Triglycerides: 133 mg/dL (ref 0.0–149.0)
VLDL: 26.6 mg/dL (ref 0.0–40.0)

## 2022-02-07 LAB — HEMOGLOBIN A1C: Hgb A1c MFr Bld: 5.7 % (ref 4.6–6.5)

## 2022-02-07 LAB — TSH: TSH: 10.69 u[IU]/mL — ABNORMAL HIGH (ref 0.35–5.50)

## 2022-02-07 MED ORDER — LISINOPRIL 10 MG PO TABS
10.0000 mg | ORAL_TABLET | Freq: Every day | ORAL | 3 refills | Status: DC
  Filled 2022-02-07: qty 90, 90d supply, fill #0
  Filled 2022-04-02: qty 30, 30d supply, fill #0
  Filled 2022-05-05: qty 30, 30d supply, fill #1
  Filled 2022-06-15: qty 30, 30d supply, fill #2
  Filled 2022-07-16 (×2): qty 30, 30d supply, fill #3
  Filled 2022-08-10: qty 30, 30d supply, fill #4
  Filled 2022-09-09: qty 30, 30d supply, fill #5
  Filled 2022-10-07: qty 30, 30d supply, fill #6

## 2022-02-07 MED ORDER — LEVOTHYROXINE SODIUM 150 MCG PO TABS
150.0000 ug | ORAL_TABLET | Freq: Every day | ORAL | 3 refills | Status: DC
  Filled 2022-02-07: qty 90, 90d supply, fill #0

## 2022-02-08 ENCOUNTER — Other Ambulatory Visit (HOSPITAL_COMMUNITY): Payer: Self-pay

## 2022-02-08 LAB — HEPATITIS C ANTIBODY: Hepatitis C Ab: NONREACTIVE

## 2022-02-08 MED ORDER — LEVOTHYROXINE SODIUM 175 MCG PO TABS
175.0000 ug | ORAL_TABLET | Freq: Every day | ORAL | 1 refills | Status: DC
  Filled 2022-05-24: qty 30, 30d supply, fill #0
  Filled 2022-06-22: qty 30, 30d supply, fill #1
  Filled 2022-07-23 – 2022-08-02 (×2): qty 30, 30d supply, fill #2
  Filled 2022-08-11: qty 30, 30d supply, fill #0
  Filled 2022-08-11: qty 30, 30d supply, fill #2

## 2022-02-09 ENCOUNTER — Other Ambulatory Visit (HOSPITAL_COMMUNITY): Payer: Self-pay

## 2022-02-13 ENCOUNTER — Other Ambulatory Visit: Payer: Self-pay | Admitting: Physician Assistant

## 2022-02-13 MED ORDER — QUETIAPINE FUMARATE 50 MG PO TABS
150.0000 mg | ORAL_TABLET | Freq: Every day | ORAL | 0 refills | Status: DC
  Filled 2022-02-13: qty 270, 90d supply, fill #0

## 2022-02-14 ENCOUNTER — Other Ambulatory Visit (HOSPITAL_COMMUNITY): Payer: Self-pay

## 2022-02-14 ENCOUNTER — Ambulatory Visit (HOSPITAL_BASED_OUTPATIENT_CLINIC_OR_DEPARTMENT_OTHER): Payer: BC Managed Care – PPO

## 2022-02-14 ENCOUNTER — Encounter (HOSPITAL_BASED_OUTPATIENT_CLINIC_OR_DEPARTMENT_OTHER): Payer: Self-pay

## 2022-02-14 ENCOUNTER — Other Ambulatory Visit: Payer: Self-pay

## 2022-02-15 ENCOUNTER — Other Ambulatory Visit (HOSPITAL_COMMUNITY): Payer: Self-pay

## 2022-02-22 ENCOUNTER — Other Ambulatory Visit (HOSPITAL_COMMUNITY): Payer: Self-pay

## 2022-02-23 ENCOUNTER — Encounter: Payer: Self-pay | Admitting: Internal Medicine

## 2022-02-23 DIAGNOSIS — M0600 Rheumatoid arthritis without rheumatoid factor, unspecified site: Secondary | ICD-10-CM

## 2022-02-24 MED ORDER — MELOXICAM 15 MG PO TABS: 15.0000 mg | ORAL_TABLET | Freq: Every day | ORAL | 0 refills | Status: DC

## 2022-02-26 ENCOUNTER — Other Ambulatory Visit: Payer: Self-pay | Admitting: Physician Assistant

## 2022-02-26 ENCOUNTER — Other Ambulatory Visit (HOSPITAL_COMMUNITY): Payer: Self-pay

## 2022-02-26 MED FILL — Omeprazole Cap Delayed Release 40 MG: ORAL | 30 days supply | Qty: 60 | Fill #4 | Status: CN

## 2022-02-26 MED FILL — Omeprazole Cap Delayed Release 40 MG: ORAL | 30 days supply | Qty: 60 | Fill #4 | Status: AC

## 2022-02-27 MED ORDER — GABAPENTIN 300 MG PO CAPS
300.0000 mg | ORAL_CAPSULE | Freq: Every day | ORAL | 0 refills | Status: DC
  Filled 2022-02-27: qty 90, 90d supply, fill #0
  Filled 2022-03-18: qty 30, 30d supply, fill #0
  Filled 2022-04-21: qty 30, 30d supply, fill #1
  Filled 2022-05-22: qty 30, 30d supply, fill #2

## 2022-02-28 ENCOUNTER — Other Ambulatory Visit: Payer: Self-pay

## 2022-02-28 ENCOUNTER — Telehealth: Payer: Self-pay | Admitting: Physician Assistant

## 2022-02-28 ENCOUNTER — Inpatient Hospital Stay (HOSPITAL_BASED_OUTPATIENT_CLINIC_OR_DEPARTMENT_OTHER): Admission: RE | Admit: 2022-02-28 | Payer: BC Managed Care – PPO | Source: Ambulatory Visit

## 2022-02-28 ENCOUNTER — Other Ambulatory Visit (HOSPITAL_COMMUNITY): Payer: Self-pay

## 2022-02-28 NOTE — Telephone Encounter (Signed)
Last filled 2/5. Provider out d/t illness.

## 2022-02-28 NOTE — Telephone Encounter (Signed)
Pt called at 9:43a.  She thinks she needs an increase in Adderall.  She said it is not working.  No upcoming appt scheduled.

## 2022-03-01 ENCOUNTER — Ambulatory Visit (HOSPITAL_BASED_OUTPATIENT_CLINIC_OR_DEPARTMENT_OTHER)
Admission: RE | Admit: 2022-03-01 | Discharge: 2022-03-01 | Disposition: A | Discharge status: Home / Self Care | Payer: BC Managed Care – PPO | Source: Ambulatory Visit | Attending: Family Medicine | Admitting: Family Medicine

## 2022-03-01 ENCOUNTER — Encounter (HOSPITAL_BASED_OUTPATIENT_CLINIC_OR_DEPARTMENT_OTHER): Payer: Self-pay

## 2022-03-01 DIAGNOSIS — Z1231 Encounter for screening mammogram for malignant neoplasm of breast: Secondary | ICD-10-CM | POA: Diagnosis not present

## 2022-03-01 NOTE — Telephone Encounter (Signed)
Patient notified of this and said that was fair.

## 2022-03-01 NOTE — Telephone Encounter (Signed)
Pt is following up on her call from yesterday. She made an appt for 3/26 as she hadn't been seen in a while.

## 2022-03-01 NOTE — Telephone Encounter (Signed)
LVM to RC.  Patient not due for RF until 3/5 and provider is out sick, can wait until she is able to respond to messages.

## 2022-03-02 ENCOUNTER — Other Ambulatory Visit (HOSPITAL_COMMUNITY): Payer: Self-pay

## 2022-03-02 ENCOUNTER — Other Ambulatory Visit: Payer: Self-pay | Admitting: Physician Assistant

## 2022-03-02 MED ORDER — AMPHETAMINE-DEXTROAMPHETAMINE 10 MG PO TABS
10.0000 mg | ORAL_TABLET | Freq: Every day | ORAL | 0 refills | Status: DC
  Filled 2022-03-02: qty 30, 30d supply, fill #0

## 2022-03-02 MED ORDER — AMPHETAMINE-DEXTROAMPHET ER 30 MG PO CP24
30.0000 mg | ORAL_CAPSULE | Freq: Every day | ORAL | 0 refills | Status: DC
  Filled 2022-03-07 – 2022-03-09 (×2): qty 30, 30d supply, fill #0

## 2022-03-02 NOTE — Telephone Encounter (Signed)
Patient notified of Rx and recommendations.  

## 2022-03-02 NOTE — Telephone Encounter (Signed)
I've sent in a prescription for Adderall IR 10 mg, take 1 at lunch (she can fill that now) Continue the XR 30 mg q am. I sent in 1 Rx for the XR. (Can't fill that till next week)Thanks

## 2022-03-02 NOTE — Telephone Encounter (Signed)
Patient is asking to increase her Adderall dose. She is not focusing as well as she used to and said by 2:00 she is spent for the day. She is due for a RF next week.   Pharmacy - [facility].

## 2022-03-07 ENCOUNTER — Other Ambulatory Visit (HOSPITAL_COMMUNITY): Payer: Self-pay

## 2022-03-08 ENCOUNTER — Encounter: Payer: Self-pay | Admitting: Family Medicine

## 2022-03-08 NOTE — Telephone Encounter (Signed)
Please see the MyChart message reply(ies) for my assessment and plan.  The patient gave consent for this Medical Advice Message and is aware that it may result in a bill to their insurance company as well as the possibility that this may result in a co-payment or deductible. They are an established patient, but are not seeking medical advice exclusively about a problem treated during an in person or video visit in the last 7 days. I did not recommend an in person or video visit within 7 days of my reply.  I spent a total of 12 minutes cumulative time within 7 days through [facility] messaging Lamar Blinks, MD

## 2022-03-09 ENCOUNTER — Other Ambulatory Visit (HOSPITAL_COMMUNITY): Payer: Self-pay

## 2022-03-10 ENCOUNTER — Ambulatory Visit: Payer: Managed Care, Other (non HMO) | Admitting: Family Medicine

## 2022-03-10 ENCOUNTER — Other Ambulatory Visit (HOSPITAL_COMMUNITY): Payer: Self-pay

## 2022-03-10 VITALS — BP 126/80 | HR 66 | Temp 97.6°F | Resp 18 | Ht 69.0 in | Wt 342.0 lb

## 2022-03-10 DIAGNOSIS — R600 Localized edema: Secondary | ICD-10-CM

## 2022-03-10 MED ORDER — FUROSEMIDE 20 MG PO TABS
20.0000 mg | ORAL_TABLET | Freq: Every day | ORAL | 3 refills | Status: DC
  Filled 2022-03-10: qty 30, 30d supply, fill #0
  Filled 2022-06-22: qty 30, 30d supply, fill #1

## 2022-03-10 NOTE — Progress Notes (Addendum)
[place] at [facility] [address], [place],  [address] [id number] [phone]  Date:  03/10/2022   Name:  Grace Butler   DOB:  03-Aug-1970   MRN:  [id number]  PCP:  Darreld Mclean, MD    Chief Complaint: Edema (In the ankles x 2 weeks. She does have some calf pain. )   History of Present Illness:  Grace Butler is a 52 y.o. very pleasant female patient who presents with the following:  Patient is seen today for concern of leg swelling Most recent visit with myself about 1 month ago for her physical-at that time she was overall doing well, no major changes made History of RA, mood disorder, ADD, hypothyroisidm    Her partner Ivin Booty is completing her cancer treatment and thankfully doing well.   Pt has noted swelling in her bilateral ankles for about 2 weeks  She notes it more in the evening She also notes her weight is up- despite not eating more She feels like her swelling is about the same bilaterally Not painful except when nail tech was rubbing her legs Never had this in the past   No change in her routine  No orthopnea   Wt Readings from Last 3 Encounters:  03/10/22 (!) 342 lb (155.1 kg)  02/07/22 (!) 337 lb 9.6 oz (153.1 kg)  11/24/21 (!) 336 lb 6.4 oz (152.6 kg)     Amitriptyline at bedtime Adderall Abilify 15 Seroquel 150 at bedtime Gabapentin Plaquenil 200 mg twice daily Rinvoz Levothyroxine Lisinopril Patient Active Problem List   Diagnosis Date Noted   Acute non-recurrent pansinusitis 03/09/2021   High risk medication use 07/29/2020   Bilateral knee pain 07/29/2020   Attention deficit hyperactivity disorder (ADHD) 09/28/2017   Insomnia, unspecified 09/28/2017   Acute meniscal tear of knee, left, subsequent encounter 02/10/2017   Left knee pain 09/01/2016   Obesity 01/14/2016   Rheumatoid arthritis ([place]) 01/14/2016   Hypothyroidism due to acquired atrophy of thyroid 01/14/2016   Osteopenia  09/15/2015   Bipolar 1 disorder ([place]) 08/21/2015   Gastroesophageal reflux disease without esophagitis 08/21/2015   Migraine 08/21/2015    Past Medical History:  Diagnosis Date   Bipolar 1 disorder ([place])    Chronic headache    Depression    Gallstone    Hypertension    IBS (irritable bowel syndrome)    Obesity    Rheumatoid arthritis ([place])    Thyroid disease    Ulcer     Past Surgical History:  Procedure Laterality Date   ABDOMINAL HYSTERECTOMY     APPENDECTOMY     CHOLECYSTECTOMY     COLONOSCOPY  09/07/2018   CYSTECTOMY     dermoid   ESOPHAGOGASTRODUODENOSCOPY     about 20 years ago to check for ulcers   KNEE ARTHROSCOPY Left 2019   MYOMECTOMY     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 29   UPPER GASTROINTESTINAL ENDOSCOPY      Social History   Tobacco Use   Smoking status: Former    Packs/day: 0.50    Years: 20.00    Total pack years: 10.00    Types: Cigarettes    Quit date: 01/16/2014    Years since quitting: 8.1   Smokeless tobacco: Never  Vaping Use   Vaping Use: Some days   Substances: Nicotine  Substance Use Topics   Alcohol use: Not Currently    Comment: Rarely   Drug  use: No    Family History  Problem Relation Age of Onset   Hypertension Mother    Heart attack Father    Healthy Sister    Healthy Sister    Healthy Sister    Healthy Brother    Healthy Son    Rheum arthritis Paternal Uncle    Colon cancer Neg Hx    Esophageal cancer Neg Hx    Colon polyps Neg Hx    Rectal cancer Neg Hx    Stomach cancer Neg Hx     Allergies  Allergen Reactions   Coconut (Cocos Nucifera) Anaphylaxis   Codeine    Sulfa Antibiotics Hives   Trazodone And Nefazodone     Vivid dreams    Medication list has been reviewed and updated.  Current Outpatient Medications on File Prior to Visit  Medication Sig Dispense Refill   amitriptyline (ELAVIL) 50 MG tablet Take 1 tablet (50 mg total) by mouth every evening. 90 tablet 1   amphetamine-dextroamphetamine (ADDERALL  XR) 30 MG 24 hr capsule Take 1 capsule (30 mg total) by mouth daily. 30 capsule 0   amphetamine-dextroamphetamine (ADDERALL XR) 30 MG 24 hr capsule Take 1 capsule (30 mg total) by mouth daily. 30 capsule 0   amphetamine-dextroamphetamine (ADDERALL XR) 30 MG 24 hr capsule Take 1 capsule (30 mg total) by mouth daily. 30 capsule 0   amphetamine-dextroamphetamine (ADDERALL) 10 MG tablet Take 1 tablet (10 mg total) by mouth daily with lunch. 30 tablet 0   ARIPiprazole (ABILIFY) 15 MG tablet Take 1 tablet (15 mg total) by mouth daily. 90 tablet 0   Ascorbic Acid (VITAMIN C) 100 MG tablet Take 100 mg by mouth daily.     butalbital-acetaminophen-caffeine (FIORICET) 50-325-40 MG tablet Take 1-2 tablets by mouth every 6 (six) hours as needed for headache. Max 6 per 24 hours 14 tablet 0   calcium citrate-vitamin D (CITRACAL+D) 315-200 MG-UNIT tablet Take 1 tablet by mouth 2 (two) times daily.     cholecalciferol (VITAMIN D) 1000 UNITS tablet Take 1,000 Units by mouth daily.     gabapentin (NEURONTIN) 300 MG capsule Take 1 capsule (300 mg total) by mouth at bedtime. 90 capsule 0   hydroxychloroquine (PLAQUENIL) 200 MG tablet Take 1 tablet by mouth 2 times daily. 60 tablet 2   levothyroxine (SYNTHROID) 175 MCG tablet Take 1 tablet (175 mcg total) by mouth daily before breakfast. 90 tablet 1   lisinopril (ZESTRIL) 10 MG tablet Take 1 tablet (10 mg total) by mouth daily. 90 tablet 3   meloxicam (MOBIC) 15 MG tablet Take 1 tablet (15 mg total) by mouth daily. 30 tablet 0   omeprazole (PRILOSEC) 40 MG capsule TAKE 1 CAPSULE BY MOUTH 2 TIMES DAILY FOR ACID REFLUX 180 capsule 3   QUEtiapine (SEROQUEL) 50 MG tablet Take 3 tablets (150 mg total) by mouth at bedtime. 270 tablet 0   rizatriptan (MAXALT-MLT) 10 MG disintegrating tablet Take 1 tablet (10 mg total) by mouth as needed for migraine. May repeat in 2 hours if needed 10 tablet 0   Specialty Vitamins Products (MAGNESIUM, AMINO ACID CHELATE,) 133 MG tablet Take 1  tablet by mouth 2 (two) times daily.     Upadacitinib ER (RINVOQ) 15 MG TB24 Take 1 tablet (15 mg) by mouth daily. 30 tablet 1   valACYclovir (VALTREX) 1000 MG tablet Take 1 tablet (1,000 mg total) by mouth daily. Take for 5 days for outbreak as needed 30 tablet 1   No current facility-administered medications  on file prior to visit.    Review of Systems:  As per HPI- otherwise negative.   Physical Examination: Vitals:   03/10/22 1445  BP: 126/80  Pulse: 66  Resp: 18  Temp: 97.6 F (36.4 C)  SpO2: 95%   Vitals:   03/10/22 1445  Weight: (!) 342 lb (155.1 kg)  Height: 5\' 9"  (1.753 m)   Body mass index is 50.5 kg/m. Ideal Body Weight: Weight in (lb) to have BMI = 25: 168.9  GEN: no acute distress.  Significantly obese, looks well HEENT: Atraumatic, Normocephalic.  Ears and Nose: No external deformity. CV: RRR, No M/G/R. No JVD. No thrill. No extra heart sounds. PULM: CTA B, no wheezes, crackles, rhonchi. No retractions. No resp. distress. No accessory muscle use. ABD: S, NT, ND, +BS. No rebound. No HSM. EXTR: No c/c/ PSYCH: Normally interactive. Conversant.  Mild, trace to 1+ edema is present in the dorsum of both feet and ankles.  No skin breakdown or ulceration, strong pulses bilaterally  Assessment and Plan: Lower extremity edema - Plan: B Nat Peptide, furosemide (LASIX) 20 MG tablet, Basic metabolic panel  Patient seen today with bilateral lower extremity edema for about 2 weeks.  Most consistent with venous insufficiency, edema gets worse as the day goes on and better overnight.  I offered to evaluate for DVT, however as symptoms are bilateral DVT is unlikely As such, we will check renal function and a BNP.  Assuming these are normal we will plan to use furosemide 20 mg daily as needed for symptoms.  Also encouraged frequent walking, leg elevation, compression socks  Will plan further follow- up pending labs.   Signed Lamar Blinks, MD  Received labs as below-  message to pt 3/8-  Results for orders placed or performed in visit on 03/10/22  B Nat Peptide  Result Value Ref Range   Pro B Natriuretic peptide (BNP) 44.0 0.0 - 100.0 pg/mL  Basic metabolic panel  Result Value Ref Range   Sodium 140 135 - 145 mEq/L   Potassium 4.4 3.5 - 5.1 mEq/L   Chloride 103 96 - 112 mEq/L   CO2 28 19 - 32 mEq/L   Glucose, Bld 77 70 - 99 mg/dL   BUN 9 6 - 23 mg/dL   Creatinine, Ser 0.78 0.40 - 1.20 mg/dL   GFR 88.49 >60.00 mL/min   Calcium 9.8 8.4 - 10.5 mg/dL

## 2022-03-10 NOTE — Patient Instructions (Signed)
It was good to see you again today- I will be in touch with your labs asap  Assuming labs are ok let's use furosemide 20 mg once a day as needed for swelling Wearing compression socks during the day may help too Let me know how things go!

## 2022-03-11 ENCOUNTER — Encounter: Payer: Self-pay | Admitting: Family Medicine

## 2022-03-11 LAB — BASIC METABOLIC PANEL
BUN: 9 mg/dL (ref 6–23)
CO2: 28 mEq/L (ref 19–32)
Calcium: 9.8 mg/dL (ref 8.4–10.5)
Chloride: 103 mEq/L (ref 96–112)
Creatinine, Ser: 0.78 mg/dL (ref 0.40–1.20)
GFR: 88.49 mL/min (ref >60.00)
Glucose, Bld: 77 mg/dL (ref 70–99)
Potassium: 4.4 mEq/L (ref 3.5–5.1)
Sodium: 140 mEq/L (ref 135–145)

## 2022-03-11 LAB — BRAIN NATRIURETIC PEPTIDE: Pro B Natriuretic peptide (BNP): 44 pg/mL (ref 0.0–100.0)

## 2022-03-12 ENCOUNTER — Other Ambulatory Visit (HOSPITAL_COMMUNITY): Payer: Self-pay

## 2022-03-18 ENCOUNTER — Other Ambulatory Visit: Payer: Self-pay | Admitting: Internal Medicine

## 2022-03-18 ENCOUNTER — Other Ambulatory Visit (HOSPITAL_COMMUNITY): Payer: Self-pay

## 2022-03-18 DIAGNOSIS — M0600 Rheumatoid arthritis without rheumatoid factor, unspecified site: Secondary | ICD-10-CM

## 2022-03-18 MED ORDER — MELOXICAM 15 MG PO TABS
15.0000 mg | ORAL_TABLET | Freq: Every day | ORAL | 0 refills | Status: DC
  Filled 2022-03-18: qty 30, 30d supply, fill #0

## 2022-03-18 NOTE — Telephone Encounter (Signed)
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

## 2022-03-20 NOTE — Progress Notes (Deleted)
Office Visit Note  Patient: Grace Butler             Date of Birth: 31-Aug-1970           MRN: [id number]             PCP: Darreld Mclean, MD Referring: Darreld Mclean, MD Visit Date: 03/21/2022   Subjective:  No chief complaint on file.   History of Present Illness: Grace Butler is a 52 y.o. female here for follow up ***   Previous HPI 08/30/21 Grace Butler is a 52 y.o. female here for follow up for seronegative RA on Rinvoq 15 mg daily hydroxychloroquine 400 mg daily and diclofenac 25 mg as needed.  She has not had any major flareups or exacerbations since her last visit or required any steroid treatment.  No new infections or antibiotic treatment required.  This morning at work she fell describes as tripping over her own foot and landed incurring some abrasion on her right elbow as well as lateral right knee and right hip.   Previous HPI 05/26/2021 Grace Butler is a 52 y.o. female here for follow up  for seronegative RA on Rinvoq 15mg  po qd, HCQ 400 mg PO daily, and diclofenac 25 mg PRN. She is doing well overall since last visit no major flare ups. No infections or antibiotic treatments needed. She does not have many bad days recently. She had lab testing drawn on 05/06/21 with her family medicine office including CBC, CMP, and TSH which were normal. Her wife was newly diagnosed with colorectal cancer and undergoing neoadjuvant chemoradiation therapy so she has increased stress and missed work time due to that.   Previous HPI 01/19/2021 Grace Butler is a 52 y.o. female here for follow up for seronegative RA on Enbrel 50 mg [place] weekly, HCQ 400 mg PO daily, and diclofenac 25 mg PRN. She has been doing well most of the time without a RA flare up but for the last few weeks having increased stiffness in bilateral legs, also pain but less of a problem than the stiffness. She fell twice without major injury. She has an appointment to start working with a Physiological scientist on  Thursday plans to do resistance training for strengthening and ideally some weight loss. She is interested in information about Rinvoq after hearing about the medication.   Previous HPI 10/20/20 Grace Butler is a 52 y.o. female here for follow up for seronegative rheumatoid arthritis after restarting treatment with Enbrel 50 mg [place] weekly with continued HCQ 400 mg PO daily. Workup at that visit with negative inflammatory serology, xray of left knee does show osteoarthritis changes. She had been feeling pretty well after starting Enbrel although continuing to take diclofenac 25 mg BID regularly. However in the past few weeks she is feeling worse, with several major life and family stressors going on. 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 Her joint pain is worse especially knees and ankles. Not especially stiff though. Recent fall onto right elbow and right knee without major injury. Had right knee injection with orthopedic surgery for pain and swelling there, with an improvement.   Previous HPI 07/29/20 Grace Butler is a 52 y.o. female here for transition of care for seronegative rheumatoid arthritis previously managed with [facility] rheumatology.  Rheumatoid arthritis was diagnosed in 2013 with development of bilateral hand and feet swelling and morning stiffness.  Previous treatments include methotrexate stopped due to elevated transaminases.  She was treated with Humira for about 6 months but with increasing injection site reaction and pain did not tolerate this well.  She was switched to Enbrel continued this for several months but also stopped due to the same problem of local injection site reactions and pain.  She is not sure whether the medicine was ever effective due to early discontinuation.  She started [place] in 2016 she  felt greatly improved her symptoms continue this medicine until 2021 she does question whether there was loss of efficacy.  After [name] she started hydroxychloroquine has been on this since October 2021 does not feel this medication has been highly effective for her joint pain.  Currently she complains of daily pain worst in her bilateral knees and feet.  She describes the pain as sharp but states her foot pain feels more like a pressure sensation.  The knee pain is exacerbated climbing stairs.  She notices stiffness pain and redness over her MCP joints does not describe discrete swelling in this area lately.  She is not sure whether there is knee or ankle swelling related to her arthritis body habitus and swelling and redness she gets in the feet from standing during the day.  Currently she takes Celebrex once daily in the mornings and takes Tylenol usually twice a day for her ongoing joint pain.  She previously took oral diclofenac that she found helpful but was switched due to concern of long-term NSAID exposure.  She takes hydroxychloroquine 400 mg/day.   DMARD Hx MTX d/c LFTs Humira d/c injection site reactions Enbrel d/c injection site reactions (old formulation) [name] ?loss of efficacy   Labs  02/2014 HBV/HCV neg RF neg CCP neg   No Rheumatology ROS completed.   PMFS History:  Patient Active Problem List   Diagnosis Date Noted   Acute non-recurrent pansinusitis 03/09/2021   High risk medication use 07/29/2020   Bilateral knee pain 07/29/2020   Attention deficit hyperactivity disorder (ADHD) 09/28/2017   Insomnia, unspecified 09/28/2017   Acute meniscal tear of knee, left, subsequent encounter 02/10/2017   Left knee pain 09/01/2016   Obesity 01/14/2016   Rheumatoid arthritis ([place]) 01/14/2016   Hypothyroidism due to acquired atrophy of thyroid 01/14/2016   Osteopenia 09/15/2015   Bipolar 1 disorder ([place]) 08/21/2015   Gastroesophageal reflux disease without esophagitis  08/21/2015   Migraine 08/21/2015    Past Medical History:  Diagnosis Date   Bipolar 1 disorder ([place])    Chronic headache    Depression    Gallstone    Hypertension    IBS (irritable bowel syndrome)    Obesity    Rheumatoid arthritis ([place])    Thyroid disease    Ulcer     Family History  Problem Relation Age of Onset   Hypertension Mother    Heart attack Father    Healthy Sister    Healthy Sister    Healthy Sister    Healthy Brother    Healthy Son    Rheum arthritis Paternal Uncle    Colon cancer Neg Hx    Esophageal cancer Neg Hx    Colon polyps Neg Hx    Rectal cancer Neg Hx    Stomach cancer Neg Hx    Past Surgical History:  Procedure Laterality Date   ABDOMINAL HYSTERECTOMY     APPENDECTOMY  CHOLECYSTECTOMY     COLONOSCOPY  09/07/2018   CYSTECTOMY     dermoid   ESOPHAGOGASTRODUODENOSCOPY     about 20 years ago to check for ulcers   KNEE ARTHROSCOPY Left 2019   MYOMECTOMY     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 76   UPPER GASTROINTESTINAL ENDOSCOPY     Social History   Social History Narrative   Not on file   Immunization History  Administered Date(s) Administered   Influenza,inj,Quad PF,6+ Mos 01/14/2016, 09/13/2017   Influenza-Unspecified 01/14/2016, 10/31/2016, 09/13/2017, 12/17/2017, 08/06/2018   Pneumococcal Conjugate-13 03/24/2014   Pneumococcal Polysaccharide-23 05/26/2014   Pneumococcal-Unspecified 05/26/2014   Tdap 11/10/2017   Zoster Recombinat (Shingrix) 01/31/2022     Objective: Vital Signs: There were no vitals taken for this visit.   Physical Exam   Musculoskeletal Exam: ***  CDAI Exam: CDAI Score: -- Patient Global: --; Provider Global: -- Swollen: --; Tender: -- Joint Exam 03/21/2022   No joint exam has been documented for this visit   There is currently no information documented on the homunculus. Go to the Rheumatology activity and complete the homunculus joint exam.  Investigation: No additional findings.  Imaging: MM  3D SCREEN BREAST BILATERAL  Result Date: 03/03/2022 CLINICAL DATA:  Screening. EXAM: DIGITAL SCREENING BILATERAL MAMMOGRAM WITH TOMOSYNTHESIS AND CAD TECHNIQUE: Bilateral screening digital craniocaudal and mediolateral oblique mammograms were obtained. Bilateral screening digital breast tomosynthesis was performed. The images were evaluated with computer-aided detection. COMPARISON:  Previous exam(s). ACR Breast Density Category a: The breasts are almost entirely fatty. FINDINGS: There are no findings suspicious for malignancy. IMPRESSION: No mammographic evidence of malignancy. A result letter of this screening mammogram will be mailed directly to the patient. RECOMMENDATION: Screening mammogram in one year. (Code:SM-B-01Y) BI-RADS CATEGORY  1: Negative. Electronically Signed   By: Ammie Ferrier M.D.   On: 03/03/2022 12:29    Recent Labs: Lab Results  Component Value Date   WBC 5.4 11/17/2021   HGB 13.4 11/17/2021   PLT 315 11/17/2021   NA 140 03/10/2022   K 4.4 03/10/2022   CL 103 03/10/2022   CO2 28 03/10/2022   GLUCOSE 77 03/10/2022   BUN 9 03/10/2022   CREATININE 0.78 03/10/2022   BILITOT 0.6 11/17/2021   ALKPHOS 99 01/25/2021   AST 27 11/17/2021   ALT 26 11/17/2021   PROT 7.2 11/17/2021   ALBUMIN 4.2 01/25/2021   CALCIUM 9.8 03/10/2022   GFRAA >60 04/23/2019   QFTBGOLDPLUS NEGATIVE 07/29/2020    Speciality Comments: PLQ eye exam 05/20/2021 [facility], [place] f/u 12 months  Procedures:  No procedures performed Allergies: Coconut (cocos nucifera), Codeine, Sulfa antibiotics, and Trazodone and nefazodone   Assessment / Plan:     Visit Diagnoses: No diagnosis found.  ***  Orders: No orders of the defined types were placed in this encounter.  No orders of the defined types were placed in this encounter.    Follow-Up Instructions: No follow-ups on file.   Collier Salina, MD  Note - This record has been created using [employer].  Chart creation  errors have been sought, but may not always  have been located. Such creation errors do not reflect on  the standard of medical care.

## 2022-03-21 ENCOUNTER — Ambulatory Visit: Payer: BC Managed Care – PPO | Admitting: Internal Medicine

## 2022-03-23 ENCOUNTER — Other Ambulatory Visit (HOSPITAL_COMMUNITY): Payer: Self-pay

## 2022-03-23 ENCOUNTER — Other Ambulatory Visit: Payer: Self-pay | Admitting: Internal Medicine

## 2022-03-23 DIAGNOSIS — M0600 Rheumatoid arthritis without rheumatoid factor, unspecified site: Secondary | ICD-10-CM

## 2022-03-23 DIAGNOSIS — Z79899 Other long term (current) drug therapy: Secondary | ICD-10-CM

## 2022-03-24 ENCOUNTER — Other Ambulatory Visit (HOSPITAL_COMMUNITY): Payer: Self-pay

## 2022-03-25 ENCOUNTER — Other Ambulatory Visit: Payer: Self-pay | Admitting: Family Medicine

## 2022-03-25 ENCOUNTER — Other Ambulatory Visit (HOSPITAL_COMMUNITY): Payer: Self-pay

## 2022-03-25 MED ORDER — OMEPRAZOLE 40 MG PO CPDR
40.0000 mg | DELAYED_RELEASE_CAPSULE | Freq: Two times a day (BID) | ORAL | 0 refills | Status: DC
  Filled 2022-03-25: qty 60, 30d supply, fill #0
  Filled 2022-04-21: qty 60, 30d supply, fill #1
  Filled 2022-05-22: qty 60, 30d supply, fill #2

## 2022-03-29 ENCOUNTER — Telehealth (INDEPENDENT_AMBULATORY_CARE_PROVIDER_SITE_OTHER): Payer: BC Managed Care – PPO | Admitting: Physician Assistant

## 2022-03-29 ENCOUNTER — Other Ambulatory Visit (HOSPITAL_COMMUNITY): Payer: Self-pay

## 2022-03-29 ENCOUNTER — Encounter: Payer: Self-pay | Admitting: Physician Assistant

## 2022-03-29 DIAGNOSIS — F411 Generalized anxiety disorder: Secondary | ICD-10-CM

## 2022-03-29 DIAGNOSIS — F319 Bipolar disorder, unspecified: Secondary | ICD-10-CM

## 2022-03-29 DIAGNOSIS — F902 Attention-deficit hyperactivity disorder, combined type: Secondary | ICD-10-CM

## 2022-03-29 DIAGNOSIS — F4321 Adjustment disorder with depressed mood: Secondary | ICD-10-CM

## 2022-03-29 DIAGNOSIS — G4700 Insomnia, unspecified: Secondary | ICD-10-CM

## 2022-03-29 DIAGNOSIS — G43909 Migraine, unspecified, not intractable, without status migrainosus: Secondary | ICD-10-CM | POA: Diagnosis not present

## 2022-03-29 DIAGNOSIS — G2581 Restless legs syndrome: Secondary | ICD-10-CM

## 2022-03-29 MED ORDER — ARIPIPRAZOLE 15 MG PO TABS
15.0000 mg | ORAL_TABLET | Freq: Every day | ORAL | 1 refills | Status: DC
  Filled 2022-03-29: qty 90, 90d supply, fill #0
  Filled 2022-04-21: qty 30, 30d supply, fill #0
  Filled 2022-05-11: qty 30, 30d supply, fill #1
  Filled 2022-06-15: qty 30, 30d supply, fill #2
  Filled 2022-07-16 (×2): qty 30, 30d supply, fill #3
  Filled 2022-08-10: qty 30, 30d supply, fill #4
  Filled 2022-09-09: qty 30, 30d supply, fill #5

## 2022-03-29 MED ORDER — AMITRIPTYLINE HCL 50 MG PO TABS
50.0000 mg | ORAL_TABLET | Freq: Every evening | ORAL | 1 refills | Status: DC
  Filled 2022-03-29: qty 90, 90d supply, fill #0
  Filled 2022-05-11: qty 30, 30d supply, fill #0
  Filled 2022-06-15: qty 30, 30d supply, fill #1
  Filled 2022-07-16 (×2): qty 30, 30d supply, fill #2
  Filled 2022-08-10: qty 30, 30d supply, fill #3
  Filled 2022-09-09: qty 30, 30d supply, fill #4

## 2022-03-29 MED ORDER — AMPHETAMINE-DEXTROAMPHET ER 30 MG PO CP24
30.0000 mg | ORAL_CAPSULE | Freq: Every day | ORAL | 0 refills | Status: DC
  Filled 2022-05-11: qty 30, 30d supply, fill #0

## 2022-03-29 MED ORDER — AMPHETAMINE-DEXTROAMPHET ER 30 MG PO CP24
30.0000 mg | ORAL_CAPSULE | Freq: Every day | ORAL | 0 refills | Status: DC
  Filled 2022-06-08: qty 30, 30d supply, fill #0

## 2022-03-29 MED ORDER — AMPHETAMINE-DEXTROAMPHETAMINE 10 MG PO TABS
10.0000 mg | ORAL_TABLET | Freq: Every day | ORAL | 0 refills | Status: DC
  Filled 2022-08-10: qty 20, 20d supply, fill #0

## 2022-03-29 MED ORDER — AMPHETAMINE-DEXTROAMPHETAMINE 10 MG PO TABS
10.0000 mg | ORAL_TABLET | Freq: Every day | ORAL | 0 refills | Status: DC
  Filled 2022-06-22: qty 30, 30d supply, fill #0

## 2022-03-29 MED ORDER — AMPHETAMINE-DEXTROAMPHETAMINE 10 MG PO TABS
10.0000 mg | ORAL_TABLET | Freq: Every day | ORAL | 0 refills | Status: DC
  Filled 2022-03-29: qty 30, 30d supply, fill #0

## 2022-03-29 MED ORDER — AMPHETAMINE-DEXTROAMPHET ER 30 MG PO CP24
30.0000 mg | ORAL_CAPSULE | Freq: Every day | ORAL | 0 refills | Status: DC
  Filled 2022-04-02 – 2022-04-11 (×2): qty 30, 30d supply, fill #0

## 2022-03-29 NOTE — Progress Notes (Unsigned)
[facility]  Patient ID: Grace Butler,  MRN: [id number]  PCP: Darreld Mclean, MD  Date of Evaluation: 03/29/2022 Time spent:30 minutes  Chief Complaint:  Chief Complaint   ADHD; Anxiety; Depression; Follow-up    Virtual Visit via Telehealth  I connected with patient by a video enabled telemedicine application with their informed consent, and verified patient privacy and that I am speaking with the correct person using two identifiers.  I am private, in my office and the patient is at work.  I discussed the limitations, risks, security and privacy concerns of performing an evaluation and management service by video and the availability of in person appointments. I also discussed with the patient that there may be a patient responsible charge related to this service. The patient expressed understanding and agreed to proceed.   I discussed the assessment and treatment plan with the patient. The patient was provided an opportunity to ask questions and all were answered. The patient agreed with the plan and demonstrated an understanding of the instructions.   The patient was advised to call back or seek an in-person evaluation if the symptoms worsen or if the condition fails to improve as anticipated.  I provided 30 minutes of non-face-to-face time during this encounter.  HISTORY/CURRENT STATUS: HPI for 37-month med check.  Dad died last week. She's doing fairly well with the grief, but asks about see a therapist to help her deal with some things from her childhood.   Her wife is still cancer-free. Had colorectal CA. Thankful for that. [place] changed jobs, back to an old employer, likes it a lot.  Main problem with our meds is that she has to take Seroquel 150 mg,  or else she can't sleep. Practices good sleep hygiene.   Patient is able to enjoy things.  Energy and motivation are good.  No extreme sadness, tearfulness, or feelings of hopelessness.  ADLs and personal  hygiene are normal.  Appetite has not changed.  Weight is stable.  Denies suicidal or homicidal thoughts.  States that attention is good without easy distractibility.  Able to focus on things and finish tasks to completion.   Patient denies increased energy with decreased need for sleep, increased talkativeness, racing thoughts, impulsivity or risky behaviors, increased spending, increased libido, grandiosity, increased irritability or anger, paranoia, or hallucinations.  Denies dizziness, syncope, seizures, numbness, tingling, tremor, tics, unsteady gait, slurred speech, confusion. Denies muscle or joint pain, stiffness, or dystonia. Denies unexplained weight loss, frequent infections, or sores that heal slowly.  No polyphagia, polydipsia, or polyuria. Denies visual changes or paresthesias.   Individual Medical History/ Review of Systems: Changes? :No   RA is stable.  Allergies: Coconut (cocos nucifera), Codeine, Sulfa antibiotics, and Trazodone and nefazodone  Current Medications:  Current Outpatient Medications:    [START ON 04/28/2022] amphetamine-dextroamphetamine (ADDERALL) 10 MG tablet, Take 1 tablet (10 mg total) by mouth daily with lunch., Disp: 30 tablet, Rfl: 0   [START ON 05/27/2022] amphetamine-dextroamphetamine (ADDERALL) 10 MG tablet, Take 1 tablet (10 mg total) by mouth daily with lunch., Disp: 20 tablet, Rfl: 0   Ascorbic Acid (VITAMIN C) 100 MG tablet, Take 100 mg by mouth daily., Disp: , Rfl:    butalbital-acetaminophen-caffeine (FIORICET) 50-325-40 MG tablet, Take 1-2 tablets by mouth every 6 (six) hours as needed for headache. Max 6 per 24 hours, Disp: 14 tablet, Rfl: 0   calcium citrate-vitamin D (CITRACAL+D) 315-200 MG-UNIT tablet, Take 1 tablet by mouth 2 (two) times daily., Disp: ,  Rfl:    cholecalciferol (VITAMIN D) 1000 UNITS tablet, Take 1,000 Units by mouth daily., Disp: , Rfl:    furosemide (LASIX) 20 MG tablet, Take 1 tablet (20 mg total) by mouth daily as needed for  leg edema, Disp: 30 tablet, Rfl: 3   gabapentin (NEURONTIN) 300 MG capsule, Take 1 capsule (300 mg total) by mouth at bedtime., Disp: 90 capsule, Rfl: 0   hydroxychloroquine (PLAQUENIL) 200 MG tablet, Take 1 tablet by mouth 2 times daily., Disp: 60 tablet, Rfl: 2   levothyroxine (SYNTHROID) 175 MCG tablet, Take 1 tablet (175 mcg total) by mouth daily before breakfast., Disp: 90 tablet, Rfl: 1   lisinopril (ZESTRIL) 10 MG tablet, Take 1 tablet (10 mg total) by mouth daily., Disp: 90 tablet, Rfl: 3   omeprazole (PRILOSEC) 40 MG capsule, Take 1 capsule (40 mg total) by mouth in the morning and at bedtime., Disp: 180 capsule, Rfl: 0   QUEtiapine (SEROQUEL) 50 MG tablet, Take 3 tablets (150 mg total) by mouth at bedtime., Disp: 270 tablet, Rfl: 0   rizatriptan (MAXALT-MLT) 10 MG disintegrating tablet, Take 1 tablet (10 mg total) by mouth as needed for migraine. May repeat in 2 hours if needed, Disp: 10 tablet, Rfl: 0   Specialty Vitamins Products (MAGNESIUM, AMINO ACID CHELATE,) 133 MG tablet, Take 1 tablet by mouth 2 (two) times daily., Disp: , Rfl:    Upadacitinib ER (RINVOQ) 15 MG TB24, Take 1 tablet (15 mg) by mouth daily., Disp: 30 tablet, Rfl: 1   valACYclovir (VALTREX) 1000 MG tablet, Take 1 tablet (1,000 mg total) by mouth daily. Take for 5 days for outbreak as needed, Disp: 30 tablet, Rfl: 1   amitriptyline (ELAVIL) 50 MG tablet, Take 1 tablet (50 mg total) by mouth every evening., Disp: 90 tablet, Rfl: 1   [START ON 04/02/2022] amphetamine-dextroamphetamine (ADDERALL XR) 30 MG 24 hr capsule, Take 1 capsule (30 mg total) by mouth daily., Disp: 30 capsule, Rfl: 0   [START ON 05/06/2022] amphetamine-dextroamphetamine (ADDERALL XR) 30 MG 24 hr capsule, Take 1 capsule (30 mg total) by mouth daily., Disp: 30 capsule, Rfl: 0   [START ON 06/06/2022] amphetamine-dextroamphetamine (ADDERALL XR) 30 MG 24 hr capsule, Take 1 capsule (30 mg total) by mouth daily., Disp: 30 capsule, Rfl: 0    amphetamine-dextroamphetamine (ADDERALL) 10 MG tablet, Take 1 tablet (10 mg total) by mouth daily with lunch., Disp: 30 tablet, Rfl: 0   ARIPiprazole (ABILIFY) 15 MG tablet, Take 1 tablet (15 mg total) by mouth daily., Disp: 90 tablet, Rfl: 1   meloxicam (MOBIC) 15 MG tablet, Take 1 tablet (15 mg total) by mouth daily. (Patient not taking: Reported on 03/29/2022), Disp: 30 tablet, Rfl: 0 Medication Side Effects: none  Family Medical/ Social History: Changes?  Dad died last week. Changed jobs to [employer] EXAM:  There were no vitals taken for this visit.There is no height or weight on file to calculate BMI.  General Appearance: Casual and Well Groomed  Eye Contact:  Good  Speech:  Clear and Coherent and Normal Rate  Volume:  Normal  Mood:   sad  Affect:  Congruent  Thought Process:  Goal Directed and Descriptions of Associations: Intact  Orientation:  Full (Time, Place, and Person)  Thought Content: Logical   Suicidal Thoughts:  No  Homicidal Thoughts:  No  Memory:  WNL  Judgement:  Good  Insight:  Good  Psychomotor Activity:  Normal  Concentration:  Concentration: Good and Attention Span: Good  Recall:  Roel Cluck of Knowledge: Good  Language: Good  Assets:  Desire for Improvement  ADL's:  Intact  Cognition: WNL  Prognosis:  Good   Labs from February and March reviewed. See on chart. Her PCP follows.  DIAGNOSES:    ICD-10-CM   1. Bipolar I disorder ([place])  F31.9     2. Migraine without status migrainosus, not intractable, unspecified migraine type  G43.909 amitriptyline (ELAVIL) 50 MG tablet    3. Attention deficit hyperactivity disorder (ADHD), combined type  F90.2     4. Insomnia, unspecified type  G47.00     5. Generalized anxiety disorder  F41.1     6. Restless leg syndrome  G25.81     7. Grief  F43.21      Receiving Psychotherapy: No   RECOMMENDATIONS:  PDMP was reviewed. Last Adderall filled 03/09/2022.  Gabapentin filled 03/18/2022. I  provided 30 minutes of non-face-to-face time during this encounter, including time spent before and after the visit in records review, medical decision making, counseling pertinent to today's visit, and charting.   My sympathy in the loss of her dad.   Discussed sleep. It's ok to take 2 AP but risk of movement d/o increases. Will need to watch. Also if need higher dose of seroquel to help sleep, will try to decrease Abilify. For now, no change.   Continue amitriptyline 50 mg, 1 p.o. nightly for migraine prevention. Continue Adderall XR 30 mg, 1 q am. Continue Adderall 10 mg, 1 at lunch.  Continue Abilify 15 mg, 1 p.o. daily. Continue gabapentin 300 mg, 1 p.o. nightly. Continue Seroquel 50 mg, 3 p.o. nightly.   Refer to [facility] for therapy. Also Grief counseling through hospice.  Return in 6 months.  Donnal Moat, PA-C

## 2022-03-31 ENCOUNTER — Other Ambulatory Visit (HOSPITAL_COMMUNITY): Payer: Self-pay

## 2022-04-01 ENCOUNTER — Other Ambulatory Visit (HOSPITAL_COMMUNITY): Payer: Self-pay

## 2022-04-01 MED ORDER — ZOSTER VAC RECOMB ADJUVANTED 50 MCG/0.5ML IM SUSR
INTRAMUSCULAR | 0 refills | Status: DC
  Filled 2022-04-01: qty 1, 2d supply, fill #0

## 2022-04-02 ENCOUNTER — Other Ambulatory Visit (HOSPITAL_COMMUNITY): Payer: Self-pay

## 2022-04-04 ENCOUNTER — Ambulatory Visit: Payer: Managed Care, Other (non HMO) | Admitting: Internal Medicine

## 2022-04-04 ENCOUNTER — Other Ambulatory Visit: Payer: Self-pay | Admitting: *Deleted

## 2022-04-04 ENCOUNTER — Other Ambulatory Visit (HOSPITAL_COMMUNITY): Payer: Self-pay

## 2022-04-04 MED ORDER — DICLOFENAC SODIUM 25 MG PO TBEC
25.0000 mg | DELAYED_RELEASE_TABLET | Freq: Two times a day (BID) | ORAL | 2 refills | Status: DC | PRN
  Filled 2022-04-04: qty 60, 30d supply, fill #0
  Filled 2022-04-21: qty 60, 30d supply, fill #1

## 2022-04-04 NOTE — Progress Notes (Deleted)
Office Visit Note  Patient: Grace Butler             Date of Birth: May 01, 1970           MRN: [id number]             PCP: Darreld Mclean, MD Referring: Darreld Mclean, MD Visit Date: 04/04/2022   Subjective:  No chief complaint on file.   History of Present Illness: Grace Butler is a 52 y.o. female here for follow up ***   Previous HPI 08/30/21 Grace Butler is a 52 y.o. female here for follow up for seronegative RA on Rinvoq 15 mg daily hydroxychloroquine 400 mg daily and diclofenac 25 mg as needed.  She has not had any major flareups or exacerbations since her last visit or required any steroid treatment.  No new infections or antibiotic treatment required.  This morning at work she fell describes as tripping over her own foot and landed incurring some abrasion on her right elbow as well as lateral right knee and right hip.   Previous HPI 05/26/2021 Grace Butler is a 52 y.o. female here for follow up  for seronegative RA on Rinvoq 15mg  po qd, HCQ 400 mg PO daily, and diclofenac 25 mg PRN. She is doing well overall since last visit no major flare ups. No infections or antibiotic treatments needed. She does not have many bad days recently. She had lab testing drawn on 05/06/21 with her family medicine office including CBC, CMP, and TSH which were normal. Her wife was newly diagnosed with colorectal cancer and undergoing neoadjuvant chemoradiation therapy so she has increased stress and missed work time due to that.   Previous HPI 01/19/2021 Grace Butler is a 52 y.o. female here for follow up for seronegative RA on Enbrel 50 mg [place] weekly, HCQ 400 mg PO daily, and diclofenac 25 mg PRN. She has been doing well most of the time without a RA flare up but for the last few weeks having increased stiffness in bilateral legs, also pain but less of a problem than the stiffness. She fell twice without major injury. She has an appointment to start working with a Physiological scientist on  Thursday plans to do resistance training for strengthening and ideally some weight loss. She is interested in information about Rinvoq after hearing about the medication.   Previous HPI 10/20/20 Grace Butler is a 52 y.o. female here for follow up for seronegative rheumatoid arthritis after restarting treatment with Enbrel 50 mg [place] weekly with continued HCQ 400 mg PO daily. Workup at that visit with negative inflammatory serology, xray of left knee does show osteoarthritis changes. She had been feeling pretty well after starting Enbrel although continuing to take diclofenac 25 mg BID regularly. However in the past few weeks she is feeling worse, with several major life and family stressors going on. 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 Her joint pain is worse especially knees and ankles. Not especially stiff though. Recent fall onto right elbow and right knee without major injury. Had right knee injection with orthopedic surgery for pain and swelling there, with an improvement.   Previous HPI 07/29/20 Grace Butler is a 52 y.o. female here for transition of care for seronegative rheumatoid arthritis previously managed with [facility] rheumatology.  Rheumatoid arthritis was diagnosed in 2013 with development of bilateral hand and feet swelling and morning stiffness.  Previous treatments include methotrexate stopped due to elevated transaminases.  She was treated with Humira for about 6 months but with increasing injection site reaction and pain did not tolerate this well.  She was switched to Enbrel continued this for several months but also stopped due to the same problem of local injection site reactions and pain.  She is not sure whether the medicine was ever effective due to early discontinuation.  She started [place] in 2016 she  felt greatly improved her symptoms continue this medicine until 2021 she does question whether there was loss of efficacy.  After [name] she started hydroxychloroquine has been on this since October 2021 does not feel this medication has been highly effective for her joint pain.  Currently she complains of daily pain worst in her bilateral knees and feet.  She describes the pain as sharp but states her foot pain feels more like a pressure sensation.  The knee pain is exacerbated climbing stairs.  She notices stiffness pain and redness over her MCP joints does not describe discrete swelling in this area lately.  She is not sure whether there is knee or ankle swelling related to her arthritis body habitus and swelling and redness she gets in the feet from standing during the day.  Currently she takes Celebrex once daily in the mornings and takes Tylenol usually twice a day for her ongoing joint pain.  She previously took oral diclofenac that she found helpful but was switched due to concern of long-term NSAID exposure.  She takes hydroxychloroquine 400 mg/day.   DMARD Hx MTX d/c LFTs Humira d/c injection site reactions Enbrel d/c injection site reactions (old formulation) [name] ?loss of efficacy   Labs  02/2014 HBV/HCV neg RF neg CCP neg   No Rheumatology ROS completed.   PMFS History:  Patient Active Problem List   Diagnosis Date Noted   Acute non-recurrent pansinusitis 03/09/2021   High risk medication use 07/29/2020   Bilateral knee pain 07/29/2020   Attention deficit hyperactivity disorder (ADHD) 09/28/2017   Insomnia, unspecified 09/28/2017   Acute meniscal tear of knee, left, subsequent encounter 02/10/2017   Left knee pain 09/01/2016   Obesity 01/14/2016   Rheumatoid arthritis 01/14/2016   Hypothyroidism due to acquired atrophy of thyroid 01/14/2016   Osteopenia 09/15/2015   Bipolar 1 disorder 08/21/2015   Gastroesophageal reflux disease without esophagitis 08/21/2015    Migraine 08/21/2015    Past Medical History:  Diagnosis Date   Bipolar 1 disorder ([place])    Chronic headache    Depression    Gallstone    Hypertension    IBS (irritable bowel syndrome)    Obesity    Rheumatoid arthritis ([place])    Thyroid disease    Ulcer     Family History  Problem Relation Age of Onset   Hypertension Mother    Heart attack Father    Healthy Sister    Healthy Sister    Healthy Sister    Healthy Brother    Healthy Son    Rheum arthritis Paternal Uncle    Colon cancer Neg Hx    Esophageal cancer Neg Hx    Colon polyps Neg Hx    Rectal cancer Neg Hx    Stomach cancer Neg Hx    Past Surgical History:  Procedure Laterality Date   ABDOMINAL HYSTERECTOMY     APPENDECTOMY     CHOLECYSTECTOMY  COLONOSCOPY  09/07/2018   CYSTECTOMY     dermoid   ESOPHAGOGASTRODUODENOSCOPY     about 20 years ago to check for ulcers   KNEE ARTHROSCOPY Left 2019   MYOMECTOMY     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 62   UPPER GASTROINTESTINAL ENDOSCOPY     Social History   Social History Narrative   Not on file   Immunization History  Administered Date(s) Administered   Influenza,inj,Quad PF,6+ Mos 01/14/2016, 09/13/2017   Influenza-Unspecified 01/14/2016, 10/31/2016, 09/13/2017, 12/17/2017, 08/06/2018   Pneumococcal Conjugate-13 03/24/2014   Pneumococcal Polysaccharide-23 05/26/2014   Pneumococcal-Unspecified 05/26/2014   Tdap 11/10/2017   Zoster Recombinat (Shingrix) 01/31/2022     Objective: Vital Signs: There were no vitals taken for this visit.   Physical Exam   Musculoskeletal Exam: ***  CDAI Exam: CDAI Score: -- Patient Global: --; Provider Global: -- Swollen: --; Tender: -- Joint Exam 04/04/2022   No joint exam has been documented for this visit   There is currently no information documented on the homunculus. Go to the Rheumatology activity and complete the homunculus joint exam.  Investigation: No additional findings.  Imaging: No results  found.  Recent Labs: Lab Results  Component Value Date   WBC 5.4 11/17/2021   HGB 13.4 11/17/2021   PLT 315 11/17/2021   NA 140 03/10/2022   K 4.4 03/10/2022   CL 103 03/10/2022   CO2 28 03/10/2022   GLUCOSE 77 03/10/2022   BUN 9 03/10/2022   CREATININE 0.78 03/10/2022   BILITOT 0.6 11/17/2021   ALKPHOS 99 01/25/2021   AST 27 11/17/2021   ALT 26 11/17/2021   PROT 7.2 11/17/2021   ALBUMIN 4.2 01/25/2021   CALCIUM 9.8 03/10/2022   GFRAA >60 04/23/2019   QFTBGOLDPLUS NEGATIVE 07/29/2020    Speciality Comments: PLQ eye exam 05/20/2021 [facility], [place] f/u 12 months  Procedures:  No procedures performed Allergies: Coconut (cocos nucifera), Codeine, Sulfa antibiotics, and Trazodone and nefazodone   Assessment / Plan:     Visit Diagnoses: No diagnosis found.  ***  Orders: No orders of the defined types were placed in this encounter.  No orders of the defined types were placed in this encounter.    Follow-Up Instructions: No follow-ups on file.   Collier Salina, MD  Note - This record has been created using [employer].  Chart creation errors have been sought, but may not always  have been located. Such creation errors do not reflect on  the standard of medical care.

## 2022-04-06 ENCOUNTER — Other Ambulatory Visit (HOSPITAL_COMMUNITY): Payer: Self-pay

## 2022-04-11 ENCOUNTER — Other Ambulatory Visit: Payer: Self-pay

## 2022-04-11 ENCOUNTER — Other Ambulatory Visit (HOSPITAL_COMMUNITY): Payer: Self-pay

## 2022-04-13 ENCOUNTER — Other Ambulatory Visit (HOSPITAL_COMMUNITY): Payer: Self-pay

## 2022-04-14 ENCOUNTER — Other Ambulatory Visit (HOSPITAL_COMMUNITY): Payer: Self-pay

## 2022-04-20 ENCOUNTER — Other Ambulatory Visit (HOSPITAL_COMMUNITY): Payer: Self-pay

## 2022-04-21 ENCOUNTER — Other Ambulatory Visit (HOSPITAL_COMMUNITY): Payer: Self-pay

## 2022-04-21 ENCOUNTER — Other Ambulatory Visit: Payer: Self-pay | Admitting: Internal Medicine

## 2022-04-21 DIAGNOSIS — Z79899 Other long term (current) drug therapy: Secondary | ICD-10-CM

## 2022-04-21 DIAGNOSIS — M0600 Rheumatoid arthritis without rheumatoid factor, unspecified site: Secondary | ICD-10-CM

## 2022-04-21 NOTE — Telephone Encounter (Signed)
Contacted patient to let her know she is overdue for labs and scheduled her for a follow up appointment. Patient states plans to come to the office to update her labs soon.

## 2022-04-22 ENCOUNTER — Other Ambulatory Visit: Payer: Self-pay | Admitting: *Deleted

## 2022-04-22 DIAGNOSIS — Z9225 Personal history of immunosupression therapy: Secondary | ICD-10-CM

## 2022-04-22 DIAGNOSIS — Z79899 Other long term (current) drug therapy: Secondary | ICD-10-CM

## 2022-04-22 DIAGNOSIS — Z111 Encounter for screening for respiratory tuberculosis: Secondary | ICD-10-CM

## 2022-04-28 ENCOUNTER — Other Ambulatory Visit (HOSPITAL_COMMUNITY): Payer: Self-pay

## 2022-05-05 ENCOUNTER — Other Ambulatory Visit (HOSPITAL_COMMUNITY): Payer: Self-pay

## 2022-05-11 ENCOUNTER — Other Ambulatory Visit: Payer: Self-pay

## 2022-05-11 ENCOUNTER — Other Ambulatory Visit: Payer: Self-pay | Admitting: Physician Assistant

## 2022-05-11 ENCOUNTER — Other Ambulatory Visit (HOSPITAL_COMMUNITY): Payer: Self-pay

## 2022-05-14 NOTE — Progress Notes (Unsigned)
Office Visit Note  Patient: Grace Butler             Date of Birth: 1970/06/07           MRN: [id number]             PCP: Pearline Cables, MD Referring: Pearline Cables, MD Visit Date: 05/16/2022   Subjective:  No chief complaint on file.   History of Present Illness: Grace Butler is a 52 y.o. female here for follow up ***   Previous HPI 08/30/21 Grace Butler is a 52 y.o. female here for follow up for seronegative RA on Rinvoq 15 mg daily hydroxychloroquine 400 mg daily and diclofenac 25 mg as needed.  She has not had any major flareups or exacerbations since her last visit or required any steroid treatment.  No new infections or antibiotic treatment required.  This morning at work she fell describes as tripping over her own foot and landed incurring some abrasion on her right elbow as well as lateral right knee and right hip.   Previous HPI 05/26/2021 Grace Butler is a 52 y.o. female here for follow up  for seronegative RA on Rinvoq 15mg  po qd, HCQ 400 mg PO daily, and diclofenac 25 mg PRN. She is doing well overall since last visit no major flare ups. No infections or antibiotic treatments needed. She does not have many bad days recently. She had lab testing drawn on 05/06/21 with her family medicine office including CBC, CMP, and TSH which were normal. Her wife was newly diagnosed with colorectal cancer and undergoing neoadjuvant chemoradiation therapy so she has increased stress and missed work time due to that.   Previous HPI 01/19/2021 Grace Butler is a 52 y.o. female here for follow up for seronegative RA on Enbrel 50 mg [place] weekly, HCQ 400 mg PO daily, and diclofenac 25 mg PRN. She has been doing well most of the time without a RA flare up but for the last few weeks having increased stiffness in bilateral legs, also pain but less of a problem than the stiffness. She fell twice without major injury. She has an appointment to start working with a Systems analyst on  Thursday plans to do resistance training for strengthening and ideally some weight loss. She is interested in information about Rinvoq after hearing about the medication.   Previous HPI 10/20/20 Grace Butler is a 52 y.o. female here for follow up for seronegative rheumatoid arthritis after restarting treatment with Enbrel 50 mg [place] weekly with continued HCQ 400 mg PO daily. Workup at that visit with negative inflammatory serology, xray of left knee does show osteoarthritis changes. She had been feeling pretty well after starting Enbrel although continuing to take diclofenac 25 mg BID regularly. However in the past few weeks she is feeling worse, with several major life and family stressors going on. 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 Her joint pain is worse especially knees and ankles. Not especially stiff though. Recent fall onto right elbow and right knee without major injury. Had right knee injection with orthopedic surgery for pain and swelling there, with an improvement.   Previous HPI 07/29/20 Grace Butler is a 52 y.o. female here for transition of care for seronegative rheumatoid arthritis previously managed with [facility] rheumatology.  Rheumatoid arthritis was diagnosed in 2013 with development of bilateral hand and feet swelling and morning stiffness.  Previous treatments include methotrexate stopped due to elevated transaminases.  She was treated with Humira for about 6 months but with increasing injection site reaction and pain did not tolerate this well.  She was switched to Enbrel continued this for several months but also stopped due to the same problem of local injection site reactions and pain.  She is not sure whether the medicine was ever effective due to early discontinuation.  She started [place] in 2016 she  felt greatly improved her symptoms continue this medicine until 2021 she does question whether there was loss of efficacy.  After [name] she started hydroxychloroquine has been on this since October 2021 does not feel this medication has been highly effective for her joint pain.  Currently she complains of daily pain worst in her bilateral knees and feet.  She describes the pain as sharp but states her foot pain feels more like a pressure sensation.  The knee pain is exacerbated climbing stairs.  She notices stiffness pain and redness over her MCP joints does not describe discrete swelling in this area lately.  She is not sure whether there is knee or ankle swelling related to her arthritis body habitus and swelling and redness she gets in the feet from standing during the day.  Currently she takes Celebrex once daily in the mornings and takes Tylenol usually twice a day for her ongoing joint pain.  She previously took oral diclofenac that she found helpful but was switched due to concern of long-term NSAID exposure.  She takes hydroxychloroquine 400 mg/day.   DMARD Hx MTX d/c LFTs Humira d/c injection site reactions Enbrel d/c injection site reactions (old formulation) [name] ?loss of efficacy   Labs  02/2014 HBV/HCV neg RF neg CCP neg   No Rheumatology ROS completed.   PMFS History:  Patient Active Problem List   Diagnosis Date Noted   Acute non-recurrent pansinusitis 03/09/2021   High risk medication use 07/29/2020   Bilateral knee pain 07/29/2020   Attention deficit hyperactivity disorder (ADHD) 09/28/2017   Insomnia, unspecified 09/28/2017   Acute meniscal tear of knee, left, subsequent encounter 02/10/2017   Left knee pain 09/01/2016   Obesity 01/14/2016   Rheumatoid arthritis ([place]) 01/14/2016   Hypothyroidism due to acquired atrophy of thyroid 01/14/2016   Osteopenia 09/15/2015   Bipolar 1 disorder ([place]) 08/21/2015   Gastroesophageal reflux disease without esophagitis  08/21/2015   Migraine 08/21/2015    Past Medical History:  Diagnosis Date   Bipolar 1 disorder ([place])    Chronic headache    Depression    Gallstone    Hypertension    IBS (irritable bowel syndrome)    Obesity    Rheumatoid arthritis ([place])    Thyroid disease    Ulcer     Family History  Problem Relation Age of Onset   Hypertension Mother    Heart attack Father    Healthy Sister    Healthy Sister    Healthy Sister    Healthy Brother    Healthy Son    Rheum arthritis Paternal Uncle    Colon cancer Neg Hx    Esophageal cancer Neg Hx    Colon polyps Neg Hx    Rectal cancer Neg Hx    Stomach cancer Neg Hx    Past Surgical History:  Procedure Laterality Date   ABDOMINAL HYSTERECTOMY     APPENDECTOMY  CHOLECYSTECTOMY     COLONOSCOPY  09/07/2018   CYSTECTOMY     dermoid   ESOPHAGOGASTRODUODENOSCOPY     about 20 years ago to check for ulcers   KNEE ARTHROSCOPY Left 2019   MYOMECTOMY     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 25   UPPER GASTROINTESTINAL ENDOSCOPY     Social History   Social History Narrative   Not on file   Immunization History  Administered Date(s) Administered   Influenza,inj,Quad PF,6+ Mos 01/14/2016, 09/13/2017   Influenza-Unspecified 01/14/2016, 10/31/2016, 09/13/2017, 12/17/2017, 08/06/2018   Pneumococcal Conjugate-13 03/24/2014   Pneumococcal Polysaccharide-23 05/26/2014   Pneumococcal-Unspecified 05/26/2014   Tdap 11/10/2017   Zoster Recombinat (Shingrix) 01/31/2022     Objective: Vital Signs: There were no vitals taken for this visit.   Physical Exam   Musculoskeletal Exam: ***  CDAI Exam: CDAI Score: -- Patient Global: --; Provider Global: -- Swollen: --; Tender: -- Joint Exam 05/16/2022   No joint exam has been documented for this visit   There is currently no information documented on the homunculus. Go to the Rheumatology activity and complete the homunculus joint exam.  Investigation: No additional findings.  Imaging: No  results found.  Recent Labs: Lab Results  Component Value Date   WBC 5.4 11/17/2021   HGB 13.4 11/17/2021   PLT 315 11/17/2021   NA 140 03/10/2022   K 4.4 03/10/2022   CL 103 03/10/2022   CO2 28 03/10/2022   GLUCOSE 77 03/10/2022   BUN 9 03/10/2022   CREATININE 0.78 03/10/2022   BILITOT 0.6 11/17/2021   ALKPHOS 99 01/25/2021   AST 27 11/17/2021   ALT 26 11/17/2021   PROT 7.2 11/17/2021   ALBUMIN 4.2 01/25/2021   CALCIUM 9.8 03/10/2022   GFRAA >60 04/23/2019   QFTBGOLDPLUS NEGATIVE 07/29/2020    Speciality Comments: PLQ eye exam 05/20/2021 [facility], [place] f/u 12 months  Procedures:  No procedures performed Allergies: Coconut (cocos nucifera), Codeine, Sulfa antibiotics, and Trazodone and nefazodone   Assessment / Plan:     Visit Diagnoses: No diagnosis found.  ***  Orders: No orders of the defined types were placed in this encounter.  No orders of the defined types were placed in this encounter.    Follow-Up Instructions: No follow-ups on file.   Fuller Plan, MD  Note - This record has been created using [employer].  Chart creation errors have been sought, but may not always  have been located. Such creation errors do not reflect on  the standard of medical care.

## 2022-05-16 ENCOUNTER — Other Ambulatory Visit: Payer: Self-pay

## 2022-05-16 ENCOUNTER — Other Ambulatory Visit (HOSPITAL_COMMUNITY): Payer: Self-pay

## 2022-05-16 ENCOUNTER — Ambulatory Visit: Payer: Managed Care, Other (non HMO) | Attending: Internal Medicine | Admitting: Internal Medicine

## 2022-05-16 ENCOUNTER — Encounter: Payer: Self-pay | Admitting: Internal Medicine

## 2022-05-16 VITALS — BP 119/80 | HR 73 | Resp 16 | Ht 69.0 in | Wt 344.0 lb

## 2022-05-16 DIAGNOSIS — Z79899 Other long term (current) drug therapy: Secondary | ICD-10-CM

## 2022-05-16 DIAGNOSIS — M0600 Rheumatoid arthritis without rheumatoid factor, unspecified site: Secondary | ICD-10-CM

## 2022-05-16 LAB — CBC WITH DIFFERENTIAL/PLATELET
Eosinophils Absolute: 179 cells/uL (ref 15–500)
Eosinophils Relative: 3.2 %

## 2022-05-16 MED ORDER — DICLOFENAC SODIUM 25 MG PO TBEC
25.0000 mg | DELAYED_RELEASE_TABLET | Freq: Two times a day (BID) | ORAL | 2 refills | Status: DC | PRN
Start: 2022-05-16 — End: 2022-08-22
  Filled 2022-05-16 – 2022-05-22 (×2): qty 60, 30d supply, fill #0
  Filled 2022-07-04: qty 60, 30d supply, fill #1
  Filled 2022-07-16 – 2022-07-18 (×2): qty 60, 30d supply, fill #2

## 2022-05-16 MED ORDER — HYDROXYCHLOROQUINE SULFATE 200 MG PO TABS
200.0000 mg | ORAL_TABLET | Freq: Two times a day (BID) | ORAL | 0 refills | Status: DC
Start: 2022-05-16 — End: 2022-08-17
  Filled 2022-05-16: qty 60, 30d supply, fill #0
  Filled 2022-06-05 – 2022-06-22 (×2): qty 60, 30d supply, fill #1
  Filled 2022-07-18: qty 60, 30d supply, fill #2

## 2022-05-16 MED ORDER — RINVOQ 15 MG PO TB24
15.0000 mg | ORAL_TABLET | Freq: Every day | ORAL | 2 refills | Status: DC
Start: 2022-05-16 — End: 2022-05-25
  Filled 2022-05-16 – 2022-05-23 (×3): qty 30, 30d supply, fill #0

## 2022-05-17 LAB — COMPLETE METABOLIC PANEL WITH GFR
Albumin: 4.1 g/dL (ref 3.6–5.1)
Calcium: 9.7 mg/dL (ref 8.6–10.4)
Globulin: 2.6 g/dL (calc) (ref 1.9–3.7)
Sodium: 139 mmol/L (ref 135–146)
eGFR: 95 mL/min/{1.73_m2} (ref >=60)

## 2022-05-17 LAB — CBC WITH DIFFERENTIAL/PLATELET
Basophils Absolute: 39 cells/uL (ref 0–200)
Basophils Relative: 0.7 %
MCHC: 33.7 g/dL (ref 32.0–36.0)
Neutrophils Relative %: 67.9 %
WBC: 5.6 10*3/uL (ref 3.8–10.8)

## 2022-05-18 LAB — COMPLETE METABOLIC PANEL WITH GFR
AG Ratio: 1.6 (calc) (ref 1.0–2.5)
ALT: 20 U/L (ref 6–29)
AST: 22 U/L (ref 10–35)
Alkaline phosphatase (APISO): 90 U/L (ref 37–153)
BUN: 9 mg/dL (ref 7–25)
CO2: 25 mmol/L (ref 20–32)
Chloride: 103 mmol/L (ref 98–110)
Creat: 0.76 mg/dL (ref 0.50–1.03)
Glucose, Bld: 89 mg/dL (ref 65–99)
Potassium: 4.2 mmol/L (ref 3.5–5.3)
Total Bilirubin: 0.5 mg/dL (ref 0.2–1.2)
Total Protein: 6.7 g/dL (ref 6.1–8.1)

## 2022-05-18 LAB — CBC WITH DIFFERENTIAL/PLATELET
Absolute Monocytes: 398 cells/uL (ref 200–950)
HCT: 38.6 % (ref 35.0–45.0)
Hemoglobin: 13 g/dL (ref 11.7–15.5)
Lymphs Abs: 1182 cells/uL (ref 850–3900)
MCH: 32 pg (ref 27.0–33.0)
MCV: 95.1 fL (ref 80.0–100.0)
MPV: 10.3 fL (ref 7.5–12.5)
Monocytes Relative: 7.1 %
Neutro Abs: 3802 cells/uL (ref 1500–7800)
Platelets: 264 10*3/uL (ref 140–400)
RBC: 4.06 10*6/uL (ref 3.80–5.10)
RDW: 11.6 % (ref 11.0–15.0)
Total Lymphocyte: 21.1 %

## 2022-05-18 LAB — QUANTIFERON-TB GOLD PLUS
Mitogen-NIL: >10 IU/mL
NIL: 0.03 IU/mL
QuantiFERON-TB Gold Plus: NEGATIVE
TB1-NIL: <0 IU/mL
TB2-NIL: <0 IU/mL

## 2022-05-18 LAB — SEDIMENTATION RATE: Sed Rate: 9 mm/h (ref 0–20)

## 2022-05-21 ENCOUNTER — Other Ambulatory Visit (HOSPITAL_COMMUNITY): Payer: Self-pay

## 2022-05-21 IMAGING — CR DG CHEST 2V
2 series · 2 of 2 positions shown · non-contrast
Comparison: None.

CLINICAL DATA: Palpitations.

EXAM:
CHEST - 2 VIEW

[w chest pa]
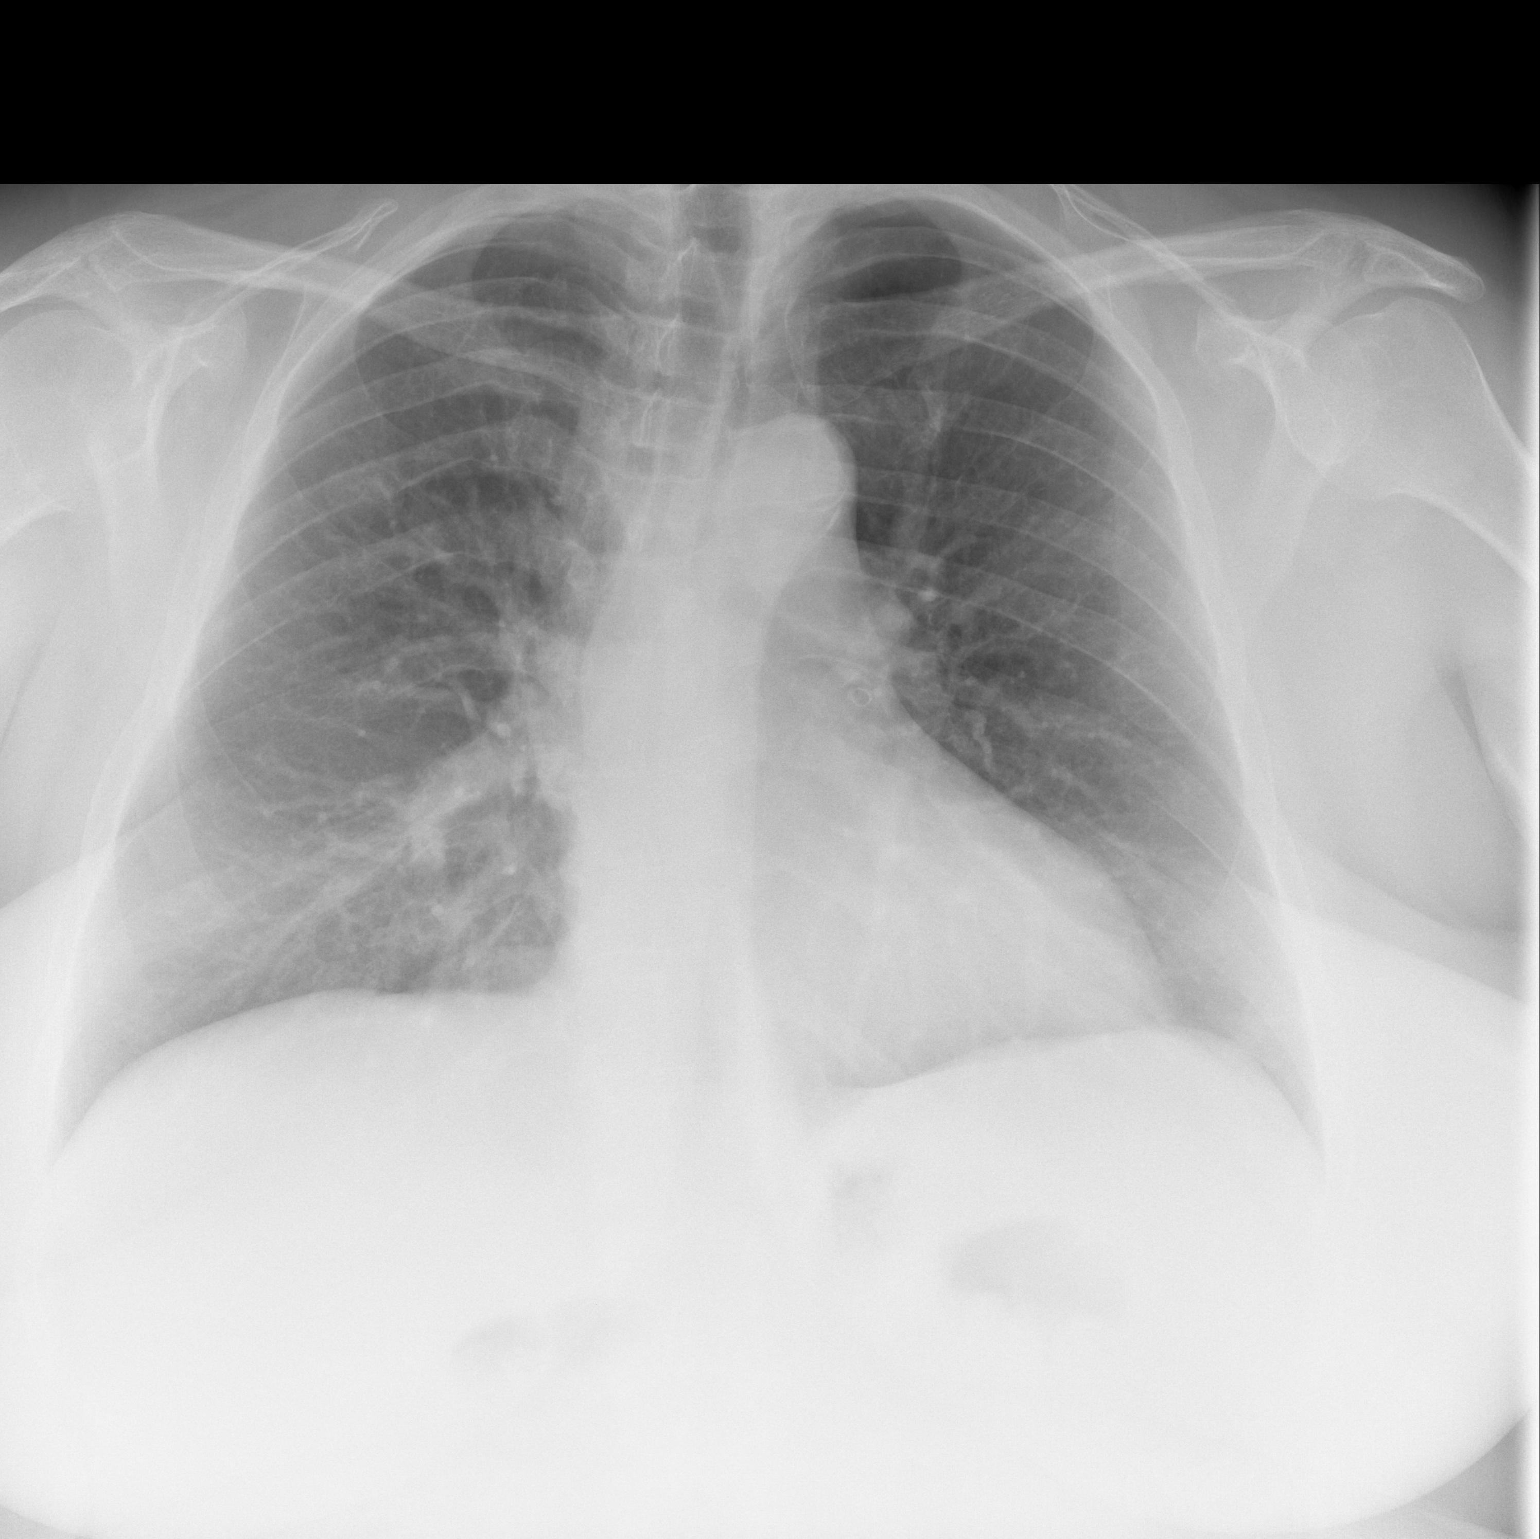

[w chest lat]
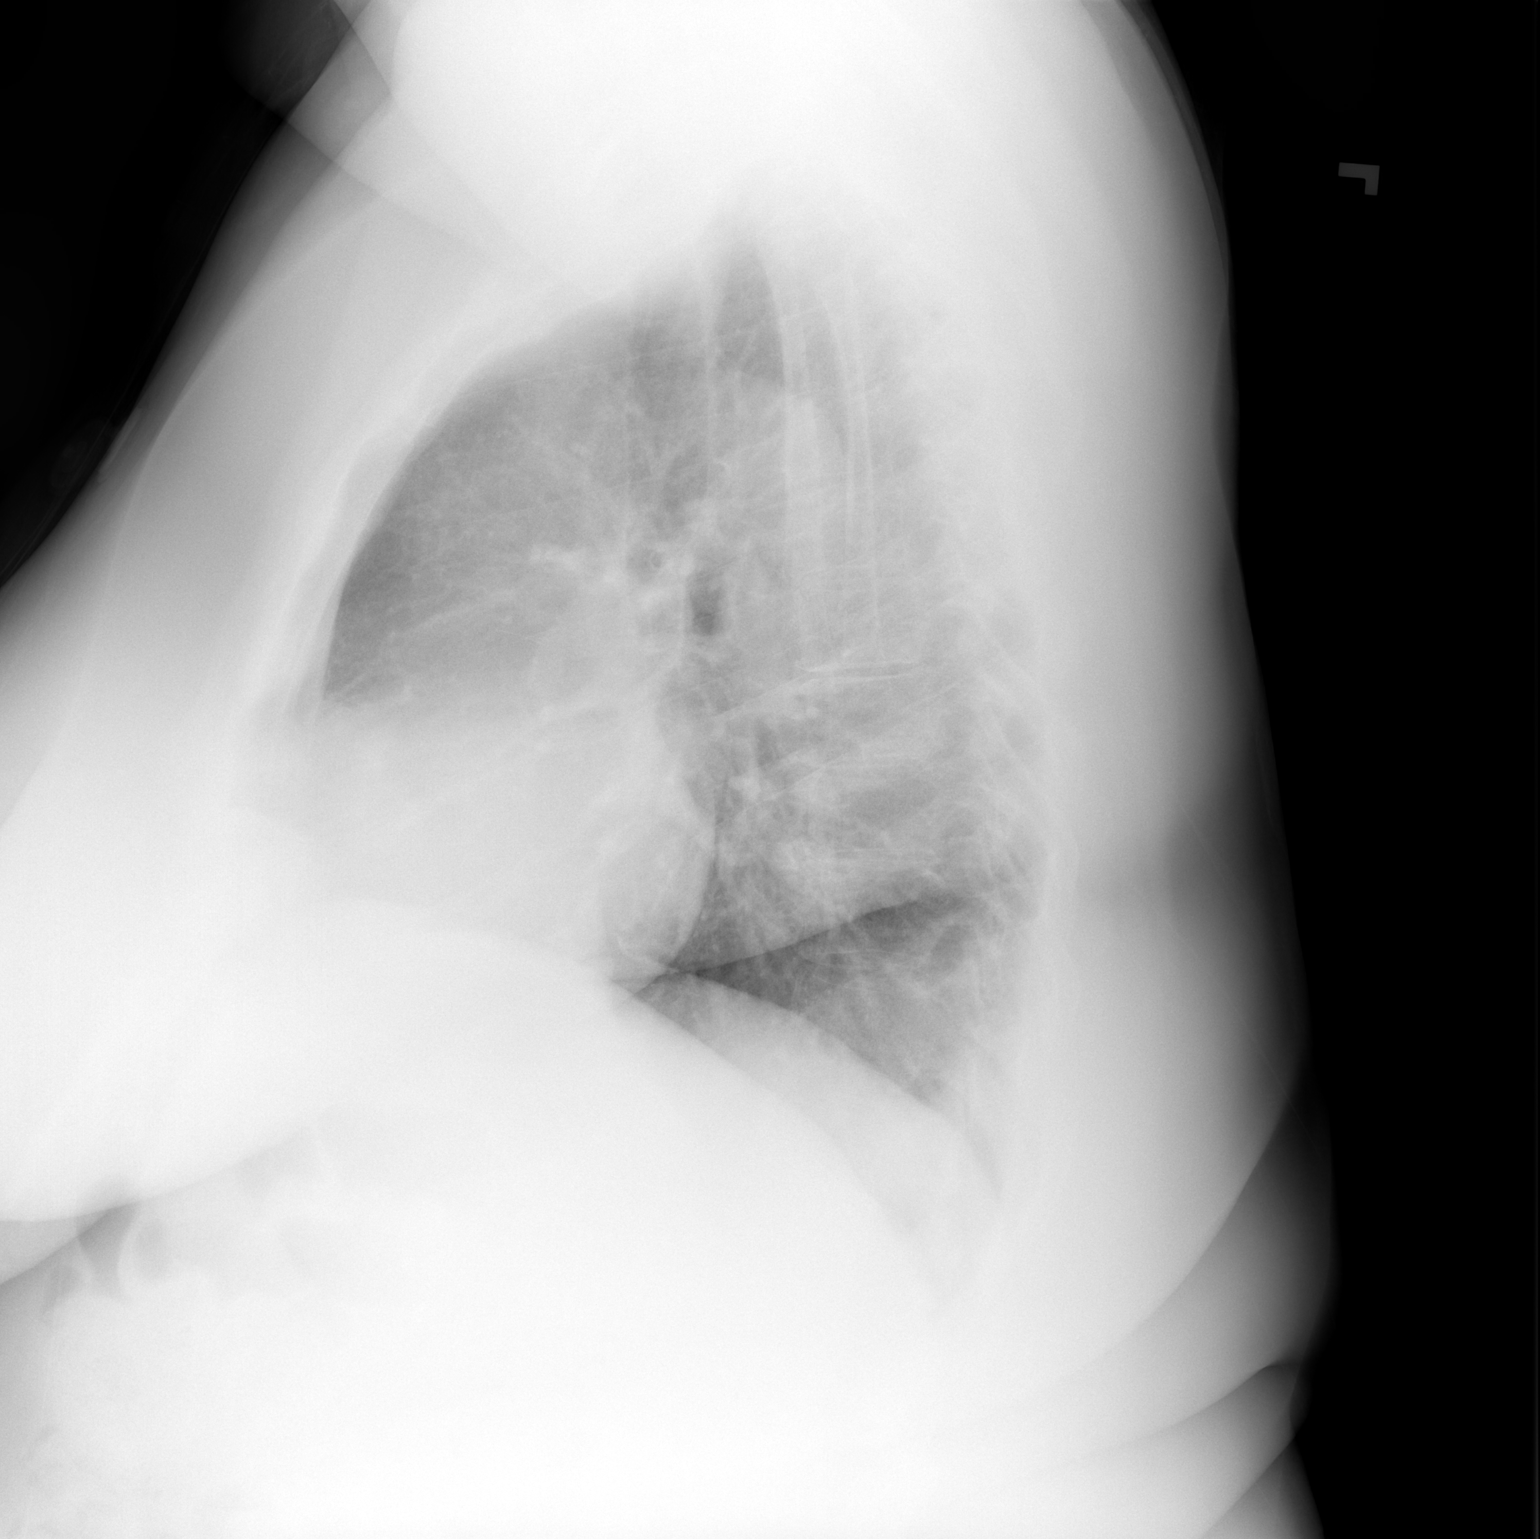

[2 of 2 positions shown; findings below may reference images not displayed]

FINDINGS: The heart size and mediastinal contours are within normal limits.
Aortic atherosclerotic calcification noted. Both lungs are clear.
Mild thoracic dextroscoliosis noted.
IMPRESSION: No active cardiopulmonary disease.

## 2022-05-23 ENCOUNTER — Other Ambulatory Visit (HOSPITAL_COMMUNITY): Payer: Self-pay

## 2022-05-23 ENCOUNTER — Telehealth: Payer: Self-pay | Admitting: Pharmacist

## 2022-05-23 ENCOUNTER — Other Ambulatory Visit: Payer: Self-pay | Admitting: Physician Assistant

## 2022-05-23 ENCOUNTER — Other Ambulatory Visit: Payer: Self-pay

## 2022-05-23 DIAGNOSIS — M0600 Rheumatoid arthritis without rheumatoid factor, unspecified site: Secondary | ICD-10-CM

## 2022-05-23 MED ORDER — QUETIAPINE FUMARATE 50 MG PO TABS
150.0000 mg | ORAL_TABLET | Freq: Every day | ORAL | 0 refills | Status: DC
  Filled 2022-05-23: qty 90, 30d supply, fill #0
  Filled 2022-06-22: qty 90, 30d supply, fill #1
  Filled 2022-07-18: qty 90, 30d supply, fill #2

## 2022-05-23 NOTE — Telephone Encounter (Signed)
Received email from [employer] stating patient has had insurance change. Submitted an URGENT Prior Authorization request to [employer] for [facility] via CoverMyMeds. Will update once we receive a response.  Key: [id number]

## 2022-05-23 NOTE — Telephone Encounter (Signed)
Received notification from [employer] regarding a prior authorization for [facility]. Authorization has been APPROVED from 05/23/2022 to 05/23/2023.  Per test claim, copay for 30 days supply is $140  Patient must fill through [facility]: [phone]. One more fill allowed through [facility] which we will push through since [facility] will take 7-10 business days to even get first fill processed. Notified [facility]  Authorization # [id number]  Will send rx to [facility] after rx has been filled at [facility].  Chesley Mires, PharmD, MPH, BCPS, CPP Clinical Pharmacist (Rheumatology and Pulmonology)

## 2022-05-24 ENCOUNTER — Other Ambulatory Visit (HOSPITAL_COMMUNITY): Payer: Self-pay

## 2022-05-24 ENCOUNTER — Other Ambulatory Visit: Payer: Self-pay

## 2022-05-25 ENCOUNTER — Other Ambulatory Visit (HOSPITAL_COMMUNITY): Payer: Self-pay

## 2022-05-25 MED ORDER — RINVOQ 15 MG PO TB24
15.0000 mg | ORAL_TABLET | Freq: Every day | ORAL | 2 refills | Status: DC
Start: 2022-05-25 — End: 2022-08-31

## 2022-05-25 NOTE — Telephone Encounter (Signed)
Called patient to advise that [facility] will no longer be able to fill her Rinvoq due to insurance change to [place]. She verbalized iunderstanding. MyChart message sent with [facility]'s information and Rinvoq copay card information  Rx ID- [id number] Rx GROUP- [id number] Rx BIN- [id number] PCN- [id number]  Pt to f/u with [facility] and will let us know if any issues with filling med  Chesley Mires, PharmD, MPH, BCPS, CPP Clinical Pharmacist (Rheumatology and Pulmonology)

## 2022-05-31 ENCOUNTER — Other Ambulatory Visit: Payer: Self-pay

## 2022-06-01 ENCOUNTER — Other Ambulatory Visit (HOSPITAL_COMMUNITY): Payer: Self-pay

## 2022-06-02 ENCOUNTER — Other Ambulatory Visit (HOSPITAL_COMMUNITY): Payer: Self-pay

## 2022-06-03 ENCOUNTER — Other Ambulatory Visit (HOSPITAL_COMMUNITY): Payer: Self-pay

## 2022-06-05 ENCOUNTER — Other Ambulatory Visit: Payer: Self-pay | Admitting: Physician Assistant

## 2022-06-05 NOTE — Telephone Encounter (Signed)
LF 5/8, due 6/6

## 2022-06-06 ENCOUNTER — Other Ambulatory Visit: Payer: Self-pay

## 2022-06-06 ENCOUNTER — Other Ambulatory Visit (HOSPITAL_COMMUNITY): Payer: Self-pay

## 2022-06-07 ENCOUNTER — Other Ambulatory Visit (HOSPITAL_COMMUNITY): Payer: Self-pay

## 2022-06-08 ENCOUNTER — Other Ambulatory Visit: Payer: Self-pay

## 2022-06-08 ENCOUNTER — Other Ambulatory Visit (HOSPITAL_COMMUNITY): Payer: Self-pay

## 2022-06-09 ENCOUNTER — Other Ambulatory Visit (HOSPITAL_COMMUNITY): Payer: Self-pay

## 2022-06-09 MED ORDER — AMPHETAMINE-DEXTROAMPHET ER 30 MG PO CP24
30.0000 mg | ORAL_CAPSULE | Freq: Every day | ORAL | 0 refills | Status: DC
  Filled 2022-06-09 – 2022-07-06 (×3): qty 30, 30d supply, fill #0

## 2022-06-15 ENCOUNTER — Other Ambulatory Visit (HOSPITAL_COMMUNITY): Payer: Self-pay

## 2022-06-15 ENCOUNTER — Other Ambulatory Visit: Payer: Self-pay

## 2022-06-15 ENCOUNTER — Other Ambulatory Visit: Payer: Self-pay | Admitting: Family Medicine

## 2022-06-15 MED ORDER — OMEPRAZOLE 40 MG PO CPDR
40.0000 mg | DELAYED_RELEASE_CAPSULE | Freq: Two times a day (BID) | ORAL | 0 refills | Status: DC
  Filled 2022-06-15: qty 180, 90d supply, fill #0

## 2022-06-22 ENCOUNTER — Other Ambulatory Visit (HOSPITAL_COMMUNITY): Payer: Self-pay

## 2022-06-23 ENCOUNTER — Other Ambulatory Visit: Payer: Self-pay

## 2022-06-23 ENCOUNTER — Other Ambulatory Visit (HOSPITAL_COMMUNITY): Payer: Self-pay

## 2022-06-25 ENCOUNTER — Other Ambulatory Visit: Payer: Self-pay | Admitting: Physician Assistant

## 2022-06-25 ENCOUNTER — Other Ambulatory Visit (HOSPITAL_COMMUNITY): Payer: Self-pay

## 2022-06-26 NOTE — Telephone Encounter (Signed)
Due 6/25

## 2022-06-28 ENCOUNTER — Other Ambulatory Visit: Payer: Self-pay

## 2022-06-28 ENCOUNTER — Other Ambulatory Visit (HOSPITAL_COMMUNITY): Payer: Self-pay

## 2022-06-28 MED ORDER — GABAPENTIN 300 MG PO CAPS
300.0000 mg | ORAL_CAPSULE | Freq: Every day | ORAL | 0 refills | Status: DC
  Filled 2022-06-28: qty 30, 30d supply, fill #0
  Filled 2022-07-16 – 2022-07-27 (×2): qty 30, 30d supply, fill #1
  Filled 2022-08-22: qty 30, 30d supply, fill #2

## 2022-07-04 ENCOUNTER — Other Ambulatory Visit (HOSPITAL_COMMUNITY): Payer: Self-pay

## 2022-07-06 ENCOUNTER — Other Ambulatory Visit: Payer: Self-pay

## 2022-07-06 ENCOUNTER — Other Ambulatory Visit (HOSPITAL_COMMUNITY): Payer: Self-pay

## 2022-07-09 ENCOUNTER — Other Ambulatory Visit (HOSPITAL_COMMUNITY): Payer: Self-pay

## 2022-07-16 ENCOUNTER — Other Ambulatory Visit (HOSPITAL_COMMUNITY): Payer: Self-pay

## 2022-08-01 ENCOUNTER — Other Ambulatory Visit (HOSPITAL_COMMUNITY): Payer: Self-pay

## 2022-08-02 ENCOUNTER — Other Ambulatory Visit: Payer: Self-pay

## 2022-08-02 ENCOUNTER — Other Ambulatory Visit: Payer: Self-pay | Admitting: Behavioral Health

## 2022-08-02 NOTE — Telephone Encounter (Signed)
Due 8/4

## 2022-08-04 ENCOUNTER — Other Ambulatory Visit (HOSPITAL_COMMUNITY): Payer: Self-pay

## 2022-08-04 NOTE — Progress Notes (Deleted)
Office Visit Note  Patient: Grace Butler             Date of Birth: [birth year]           MRN: [id number]             PCP: Pearline Cables, MD Referring: Pearline Cables, MD Visit Date: 08/16/2022   Subjective:  No chief complaint on file.   History of Present Illness: Grace Butler is a 52 y.o. female here for follow up for seropositive RA on Rinvoq 15 mg daily hydroxychloroquine 400 mg daily and diclofenac 25 mg twice daily as needed.    Previous HPI 05/16/2022 Grace Butler is a 52 y.o. female here for follow up for seropositive RA on Rinvoq 15 mg daily hydroxychloroquine 400 mg daily and diclofenac 25 mg twice daily as needed.  Somewhat prolonged time since her last follow-up as she has had many major life events going on including change of work and marriage separation.  Without significant flareup over the winter or throughout this time.  Has not had any serious interval infections or complications.  No new falls or injuries.   Previous HPI 08/30/21 Grace Butler is a 52 y.o. female here for follow up for seronegative RA on Rinvoq 15 mg daily hydroxychloroquine 400 mg daily and diclofenac 25 mg as needed.  She has not had any major flareups or exacerbations since her last visit or required any steroid treatment.  No new infections or antibiotic treatment required.  This morning at work she fell describes as tripping over her own foot and landed incurring some abrasion on her right elbow as well as lateral right knee and right hip.   Previous HPI 05/26/2021 Grace Butler is a 52 y.o. female here for follow up  for seronegative RA on Rinvoq 15mg  po qd, HCQ 400 mg PO daily, and diclofenac 25 mg PRN. She is doing well overall since last visit no major flare ups. No infections or antibiotic treatments needed. She does not have many bad days recently. She had lab testing drawn on 05/06/21 with her family medicine office including CBC, CMP, and TSH which were normal. Her wife was  newly diagnosed with colorectal cancer and undergoing neoadjuvant chemoradiation therapy so she has increased stress and missed work time due to that.   Previous HPI 01/19/2021 Grace Butler is a 52 y.o. female here for follow up for seronegative RA on Enbrel 50 mg [place] weekly, HCQ 400 mg PO daily, and diclofenac 25 mg PRN. She has been doing well most of the time without a RA flare up but for the last few weeks having increased stiffness in bilateral legs, also pain but less of a problem than the stiffness. She fell twice without major injury. She has an appointment to start working with a Systems analyst on Thursday plans to do resistance training for strengthening and ideally some weight loss. She is interested in information about Rinvoq after hearing about the medication.   Previous HPI 10/20/20 Grace Butler is a 52 y.o. female here for follow up for seronegative rheumatoid arthritis after restarting treatment with Enbrel 50 mg [place] weekly with continued HCQ 400 mg PO daily. Workup at that visit with negative inflammatory serology, xray of left knee does show osteoarthritis changes. She had been feeling pretty well after starting Enbrel although continuing to take diclofenac 25 mg BID regularly. However in the past few weeks she is feeling worse, with several major  life and family stressors going on. 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 Her joint pain is worse especially knees and ankles. Not especially stiff though. Recent fall onto right elbow and right knee without major injury. Had right knee injection with orthopedic surgery for pain and swelling there, with an improvement.   Previous HPI 07/29/20 Grace Butler is a 52 y.o. female here for transition of care for seronegative rheumatoid arthritis previously managed with [facility] rheumatology.  Rheumatoid arthritis was diagnosed in 2013 with development of bilateral hand and feet swelling and morning stiffness.  Previous treatments include methotrexate stopped due to elevated transaminases.  She was treated with Humira for about 6 months but with increasing injection site reaction and pain did not tolerate this well.  She was switched to Enbrel continued this for several months but also stopped due to the same problem of local injection site reactions and pain.  She is not sure whether the medicine was ever effective due to early discontinuation.  She started [place] in 2016 she felt greatly improved her symptoms continue this medicine until 2021 she does question whether there was loss of efficacy.  After Harriette Ohara she started hydroxychloroquine has been on this since October 2021 does not feel this medication has been highly effective for her joint pain.  Currently she complains of daily pain worst in her bilateral knees and feet.  She describes the pain as sharp but states her foot pain feels more like a pressure sensation.  The knee pain is exacerbated climbing stairs.  She notices stiffness pain and redness over her MCP joints does not describe discrete swelling in this area lately.  She is not sure whether there is knee or ankle swelling related to her arthritis body habitus and swelling and redness she gets in the feet from standing during the day.  Currently she takes Celebrex once daily in the mornings and takes Tylenol usually twice a day for her ongoing joint pain.  She previously took oral diclofenac that she found helpful but was switched due to concern of long-term NSAID exposure.  She takes hydroxychloroquine 400 mg/day.   DMARD Hx MTX d/c LFTs Humira d/c injection site reactions Enbrel d/c injection site reactions (old formulation) Harriette Ohara ?loss of efficacy   Labs  02/2014 HBV/HCV neg RF neg CCP neg   No Rheumatology ROS completed.   PMFS History:   Patient Active Problem List   Diagnosis Date Noted   Acute non-recurrent pansinusitis 03/09/2021   High risk medication use 07/29/2020   Bilateral knee pain 07/29/2020   Attention deficit hyperactivity disorder (ADHD) 09/28/2017   Insomnia, unspecified 09/28/2017   Acute meniscal tear of knee, left, subsequent encounter 02/10/2017   Left knee pain 09/01/2016   Obesity 01/14/2016   Rheumatoid arthritis (HCC) 01/14/2016   Hypothyroidism due to acquired atrophy of thyroid 01/14/2016   Osteopenia 09/15/2015   Bipolar 1 disorder (HCC) 08/21/2015   Gastroesophageal reflux disease without esophagitis 08/21/2015   Migraine 08/21/2015    Past Medical History:  Diagnosis Date   Bipolar 1 disorder (HCC)    Chronic headache    Depression    Gallstone    Hypertension    IBS (irritable bowel syndrome)    Obesity    Rheumatoid arthritis (HCC)    Thyroid disease    Ulcer  Family History  Problem Relation Age of Onset   Hypertension Mother    Heart attack Father    Healthy Sister    Healthy Sister    Healthy Sister    Healthy Brother    Healthy Son    Rheum arthritis Paternal Uncle    Colon cancer Neg Hx    Esophageal cancer Neg Hx    Colon polyps Neg Hx    Rectal cancer Neg Hx    Stomach cancer Neg Hx    Past Surgical History:  Procedure Laterality Date   ABDOMINAL HYSTERECTOMY     APPENDECTOMY     CHOLECYSTECTOMY     COLONOSCOPY  09/07/2018   CYSTECTOMY     dermoid   ESOPHAGOGASTRODUODENOSCOPY     about 20 years ago to check for ulcers   KNEE ARTHROSCOPY Left 2019   MYOMECTOMY     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 55   UPPER GASTROINTESTINAL ENDOSCOPY     Social History   Social History Narrative   Not on file   Immunization History  Administered Date(s) Administered   Influenza,inj,Quad PF,6+ Mos 01/14/2016, 09/13/2017   Influenza-Unspecified 01/14/2016, 10/31/2016, 09/13/2017, 12/17/2017, 08/06/2018   Pneumococcal Conjugate-13 03/24/2014   Pneumococcal  Polysaccharide-23 05/26/2014   Pneumococcal-Unspecified 05/26/2014   Tdap 11/10/2017   Zoster Recombinant(Shingrix) 01/31/2022     Objective: Vital Signs: There were no vitals taken for this visit.   Physical Exam   Musculoskeletal Exam: ***  CDAI Exam: CDAI Score: -- Patient Global: --; Provider Global: -- Swollen: --; Tender: -- Joint Exam 08/16/2022   No joint exam has been documented for this visit   There is currently no information documented on the homunculus. Go to the Rheumatology activity and complete the homunculus joint exam.  Investigation: No additional findings.  Imaging: No results found.  Recent Labs: Lab Results  Component Value Date   WBC 5.6 05/16/2022   HGB 13.0 05/16/2022   PLT 264 05/16/2022   NA 139 05/16/2022   K 4.2 05/16/2022   CL 103 05/16/2022   CO2 25 05/16/2022   GLUCOSE 89 05/16/2022   BUN 9 05/16/2022   CREATININE 0.76 05/16/2022   BILITOT 0.5 05/16/2022   ALKPHOS 99 01/25/2021   AST 22 05/16/2022   ALT 20 05/16/2022   PROT 6.7 05/16/2022   ALBUMIN 4.2 01/25/2021   CALCIUM 9.7 05/16/2022   GFRAA >60 04/23/2019   QFTBGOLDPLUS NEGATIVE 05/16/2022    Speciality Comments: PLQ eye exam 05/20/2021 [facility] [place] f/u 12 months  Procedures:  No procedures performed Allergies: Coconut (cocos nucifera), Codeine, Sulfa antibiotics, and Trazodone and nefazodone   Assessment / Plan:     Visit Diagnoses: No diagnosis found.  ***  Orders: No orders of the defined types were placed in this encounter.  No orders of the defined types were placed in this encounter.    Follow-Up Instructions: No follow-ups on file.   Metta Clines, RT  Note - This record has been created using [employer].  Chart creation errors have been sought, but may not always  have been located. Such creation errors do not reflect on  the standard of medical care.

## 2022-08-08 ENCOUNTER — Other Ambulatory Visit (HOSPITAL_COMMUNITY): Payer: Self-pay

## 2022-08-08 MED ORDER — AMPHETAMINE-DEXTROAMPHET ER 30 MG PO CP24
30.0000 mg | ORAL_CAPSULE | Freq: Every day | ORAL | 0 refills | Status: DC
  Filled 2022-08-11: qty 30, 30d supply, fill #0

## 2022-08-10 ENCOUNTER — Other Ambulatory Visit (HOSPITAL_COMMUNITY): Payer: Self-pay

## 2022-08-11 ENCOUNTER — Other Ambulatory Visit (HOSPITAL_BASED_OUTPATIENT_CLINIC_OR_DEPARTMENT_OTHER): Payer: Self-pay

## 2022-08-11 ENCOUNTER — Other Ambulatory Visit (HOSPITAL_COMMUNITY): Payer: Self-pay

## 2022-08-14 ENCOUNTER — Telehealth: Payer: Managed Care, Other (non HMO) | Admitting: Family

## 2022-08-14 DIAGNOSIS — M545 Low back pain, unspecified: Secondary | ICD-10-CM | POA: Diagnosis not present

## 2022-08-14 MED ORDER — BACLOFEN 10 MG PO TABS
10.0000 mg | ORAL_TABLET | Freq: Three times a day (TID) | ORAL | 0 refills | Status: DC
Start: 2022-08-14 — End: 2023-03-28

## 2022-08-14 MED ORDER — PREDNISONE 20 MG PO TABS
40.0000 mg | ORAL_TABLET | Freq: Every day | ORAL | 0 refills | Status: AC
Start: 2022-08-14 — End: 2022-08-19

## 2022-08-14 NOTE — Progress Notes (Signed)
Virtual Visit Consent   Grace Butler, you are scheduled for a virtual visit with a [facility] provider today. Just as with appointments in the office, your consent must be obtained to participate. Your consent will be active for this visit and any virtual visit you may have with one of our providers in the next 365 days. If you have a MyChart account, a copy of this consent can be sent to you electronically.  As this is a virtual visit, video technology does not allow for your provider to perform a traditional examination. This may limit your provider's ability to fully assess your condition. If your provider identifies any concerns that need to be evaluated in person or the need to arrange testing (such as labs, EKG, etc.), we will make arrangements to do so. Although advances in technology are sophisticated, we cannot ensure that it will always work on either your end or our end. If the connection with a video visit is poor, the visit may have to be switched to a telephone visit. With either a video or telephone visit, we are not always able to ensure that we have a secure connection.  By engaging in this virtual visit, you consent to the provision of healthcare and authorize for your insurance to be billed (if applicable) for the services provided during this visit. Depending on your insurance coverage, you may receive a charge related to this service.  I need to obtain your verbal consent now. Are you willing to proceed with your visit today? Grace Butler has provided verbal consent on 08/14/2022 for a virtual visit (video or telephone). Grace Rodney, FNP  Date: 08/14/2022 4:59 PM  Virtual Visit via Video Note   I, Grace Butler, connected with  Grace Butler  ([id number], September 13, 1970) on 08/14/22 at  5:00 PM EDT by a video-enabled telemedicine application and verified that I am speaking with the correct person using two identifiers.  Location: Patient: Virtual Visit Location Patient:  Home Provider: Virtual Visit Location Provider: Other: car   I discussed the limitations of evaluation and management by telemedicine and the availability of in person appointments. The patient expressed understanding and agreed to proceed.    History of Present Illness: Grace Butler is a 52 y.o. who identifies as a female who was assigned female at birth, and is being seen today for low back pain that started two days ago. She is currently moving and strained her back while packing and moving items.   HPI: Back Pain This is a new problem. The current episode started in the past 7 days. The problem occurs intermittently. The problem has been waxing and waning since onset. The pain is present in the lumbar spine. The quality of the pain is described as aching. The pain does not radiate. The pain is at a severity of 7/10. The pain is moderate. Pertinent negatives include no chest pain, dysuria, fever, headaches, leg pain, paresthesias, pelvic pain, perianal numbness, tingling or weakness. Risk factors include obesity. She has tried bed rest and NSAIDs for the symptoms. The treatment provided mild relief.    Problems:  Patient Active Problem List   Diagnosis Date Noted   Acute non-recurrent pansinusitis 03/09/2021   High risk medication use 07/29/2020   Bilateral knee pain 07/29/2020   Attention deficit hyperactivity disorder (ADHD) 09/28/2017   Insomnia, unspecified 09/28/2017   Acute meniscal tear of knee, left, subsequent encounter 02/10/2017   Left knee pain 09/01/2016   Obesity 01/14/2016  Rheumatoid arthritis (HCC) 01/14/2016   Hypothyroidism due to acquired atrophy of thyroid 01/14/2016   Osteopenia 09/15/2015   Bipolar 1 disorder (HCC) 08/21/2015   Gastroesophageal reflux disease without esophagitis 08/21/2015   Migraine 08/21/2015    Allergies:  Allergies  Allergen Reactions   Coconut (Cocos Nucifera) Anaphylaxis   Codeine    Sulfa Antibiotics Hives   Trazodone And  Nefazodone     Vivid dreams   Medications:  Current Outpatient Medications:    baclofen (LIORESAL) 10 MG tablet, Take 1 tablet (10 mg total) by mouth 3 (three) times daily., Disp: 30 each, Rfl: 0   predniSONE (DELTASONE) 20 MG tablet, Take 2 tablets (40 mg total) by mouth daily with breakfast for 5 days., Disp: 10 tablet, Rfl: 0   amitriptyline (ELAVIL) 50 MG tablet, Take 1 tablet (50 mg total) by mouth every evening., Disp: 90 tablet, Rfl: 1   amphetamine-dextroamphetamine (ADDERALL XR) 30 MG 24 hr capsule, Take 1 capsule (30 mg total) by mouth daily. (Patient not taking: Reported on 05/16/2022), Disp: 30 capsule, Rfl: 0   amphetamine-dextroamphetamine (ADDERALL XR) 30 MG 24 hr capsule, Take 1 capsule (30 mg total) by mouth daily., Disp: 30 capsule, Rfl: 0   amphetamine-dextroamphetamine (ADDERALL XR) 30 MG 24 hr capsule, Take 1 capsule (30 mg total) by mouth daily., Disp: 30 capsule, Rfl: 0   amphetamine-dextroamphetamine (ADDERALL) 10 MG tablet, Take 1 tablet (10 mg total) by mouth daily with lunch. (Patient not taking: Reported on 05/16/2022), Disp: 30 tablet, Rfl: 0   amphetamine-dextroamphetamine (ADDERALL) 10 MG tablet, Take 1 tablet (10 mg total) by mouth daily with lunch. (Patient not taking: Reported on 05/16/2022), Disp: 30 tablet, Rfl: 0   amphetamine-dextroamphetamine (ADDERALL) 10 MG tablet, Take 1 tablet (10 mg total) by mouth daily with lunch., Disp: 20 tablet, Rfl: 0   ARIPiprazole (ABILIFY) 15 MG tablet, Take 1 tablet (15 mg total) by mouth daily., Disp: 90 tablet, Rfl: 1   Ascorbic Acid (VITAMIN C) 100 MG tablet, Take 100 mg by mouth daily., Disp: , Rfl:    butalbital-acetaminophen-caffeine (FIORICET) 50-325-40 MG tablet, Take 1-2 tablets by mouth every 6 (six) hours as needed for headache. Max 6 per 24 hours, Disp: 14 tablet, Rfl: 0   calcium citrate-vitamin D (CITRACAL+D) 315-200 MG-UNIT tablet, Take 1 tablet by mouth 2 (two) times daily., Disp: , Rfl:    cholecalciferol (VITAMIN  D) 1000 UNITS tablet, Take 1,000 Units by mouth daily., Disp: , Rfl:    diclofenac (VOLTAREN) 25 MG EC tablet, Take 1 tablet (25 mg) by mouth 2 times daily as needed., Disp: 60 tablet, Rfl: 2   furosemide (LASIX) 20 MG tablet, Take 1 tablet (20 mg total) by mouth daily as needed for leg edema (Patient not taking: Reported on 05/16/2022), Disp: 30 tablet, Rfl: 3   gabapentin (NEURONTIN) 300 MG capsule, Take 1 capsule (300 mg total) by mouth at bedtime., Disp: 90 capsule, Rfl: 0   hydroxychloroquine (PLAQUENIL) 200 MG tablet, Take 1 tablet by mouth 2 times daily., Disp: 180 tablet, Rfl: 0   levothyroxine (SYNTHROID) 175 MCG tablet, Take 1 tablet (175 mcg total) by mouth daily before breakfast., Disp: 90 tablet, Rfl: 1   lisinopril (ZESTRIL) 10 MG tablet, Take 1 tablet (10 mg total) by mouth daily., Disp: 90 tablet, Rfl: 3   omeprazole (PRILOSEC) 40 MG capsule, Take 1 capsule (40 mg total) by mouth in the morning and at bedtime., Disp: 180 capsule, Rfl: 0   QUEtiapine (SEROQUEL) 50 MG tablet, Take  3 tablets (150 mg total) by mouth at bedtime., Disp: 270 tablet, Rfl: 0   rizatriptan (MAXALT-MLT) 10 MG disintegrating tablet, Take 1 tablet (10 mg total) by mouth as needed for migraine. May repeat in 2 hours if needed, Disp: 10 tablet, Rfl: 0   Specialty Vitamins Products (MAGNESIUM, AMINO ACID CHELATE,) 133 MG tablet, Take 1 tablet by mouth 2 (two) times daily., Disp: , Rfl:    Upadacitinib ER (RINVOQ) 15 MG TB24, Take 1 tablet (15 mg) by mouth daily., Disp: 30 tablet, Rfl: 2   valACYclovir (VALTREX) 1000 MG tablet, Take 1 tablet (1,000 mg total) by mouth daily. Take for 5 days for outbreak as needed, Disp: 30 tablet, Rfl: 1   Zoster Vaccine Adjuvanted [facility]) injection, Inject into the muscle. (Patient not taking: Reported on 05/16/2022), Disp: 1 mL, Rfl: 0  Observations/Objective: Patient is well-developed, well-nourished in no acute distress.  Resting comfortably   Head is normocephalic, atraumatic.   No labored breathing.  Speech is clear and coherent with logical content.  Patient is alert and oriented at baseline.  Morbid obese Pain in lumbar with flexion and extension  Assessment and Plan: 1. Acute bilateral low back pain without sciatica - baclofen (LIORESAL) 10 MG tablet; Take 1 tablet (10 mg total) by mouth 3 (three) times daily.  Dispense: 30 each; Refill: 0 - predniSONE (DELTASONE) 20 MG tablet; Take 2 tablets (40 mg total) by mouth daily with breakfast for 5 days.  Dispense: 10 tablet; Refill: 0  Rest Continue diclofenc and tylenol Start prednisone and baclofen ROM exercises  Warm compresses  Follow up if symptoms worsen or do not improve   Follow Up Instructions: I discussed the assessment and treatment plan with the patient. The patient was provided an opportunity to ask questions and all were answered. The patient agreed with the plan and demonstrated an understanding of the instructions.  A copy of instructions were sent to the patient via MyChart unless otherwise noted below.    The patient was advised to call back or seek an in-person evaluation if the symptoms worsen or if the condition fails to improve as anticipated.  Time:  I spent 9 minutes with the patient via telehealth technology discussing the above problems/concerns.    Grace Rodney, FNP

## 2022-08-16 ENCOUNTER — Ambulatory Visit: Payer: Managed Care, Other (non HMO) | Admitting: Internal Medicine

## 2022-08-16 DIAGNOSIS — Z79899 Other long term (current) drug therapy: Secondary | ICD-10-CM

## 2022-08-16 DIAGNOSIS — M0600 Rheumatoid arthritis without rheumatoid factor, unspecified site: Secondary | ICD-10-CM

## 2022-08-17 ENCOUNTER — Other Ambulatory Visit (HOSPITAL_COMMUNITY): Payer: Self-pay

## 2022-08-17 ENCOUNTER — Other Ambulatory Visit: Payer: Self-pay | Admitting: Physician Assistant

## 2022-08-17 ENCOUNTER — Other Ambulatory Visit: Payer: Self-pay | Admitting: Internal Medicine

## 2022-08-17 DIAGNOSIS — M0600 Rheumatoid arthritis without rheumatoid factor, unspecified site: Secondary | ICD-10-CM

## 2022-08-17 MED ORDER — HYDROXYCHLOROQUINE SULFATE 200 MG PO TABS
200.0000 mg | ORAL_TABLET | Freq: Two times a day (BID) | ORAL | 0 refills | Status: DC
Start: 2022-08-17 — End: 2022-08-31
  Filled 2022-08-17: qty 60, 30d supply, fill #0

## 2022-08-17 MED ORDER — QUETIAPINE FUMARATE 50 MG PO TABS
150.0000 mg | ORAL_TABLET | Freq: Every day | ORAL | 0 refills | Status: DC
  Filled 2022-08-17: qty 90, 30d supply, fill #0
  Filled 2022-09-19: qty 90, 30d supply, fill #1
  Filled 2022-10-19: qty 90, 30d supply, fill #2

## 2022-08-17 NOTE — Telephone Encounter (Signed)
Last Fill: 05/16/2022  Eye exam: 05/20/2021 WNL   Labs: 05/16/2022 normal  Next Visit: Due 08/16/2022. Message sent to the front to schedule.   Last Visit: 05/16/2022  [id number] arthritis with negative rheumatoid factor, involving unspecified site   Current Dose per office note 05/16/2022: hydroxychloroquine 200 mg twice daily   Contacted the patient and advised PLQ eye exam is due. Patient states she had an eye exam at [facility]. Patient is calling her eye doctor to confirm the date she had it on and for them to fax it to the office.   Okay to refill Plaquenil?

## 2022-08-17 NOTE — Telephone Encounter (Signed)
Please schedule patient a follow up visit. Patient due 08/2022. Thanks!

## 2022-08-18 ENCOUNTER — Other Ambulatory Visit: Payer: Self-pay

## 2022-08-18 ENCOUNTER — Other Ambulatory Visit (HOSPITAL_COMMUNITY): Payer: Self-pay

## 2022-08-19 ENCOUNTER — Other Ambulatory Visit (HOSPITAL_COMMUNITY): Payer: Self-pay

## 2022-08-22 ENCOUNTER — Other Ambulatory Visit: Payer: Self-pay | Admitting: Internal Medicine

## 2022-08-22 ENCOUNTER — Other Ambulatory Visit (HOSPITAL_COMMUNITY): Payer: Self-pay

## 2022-08-22 DIAGNOSIS — M0600 Rheumatoid arthritis without rheumatoid factor, unspecified site: Secondary | ICD-10-CM

## 2022-08-22 MED ORDER — DICLOFENAC SODIUM 25 MG PO TBEC
25.0000 mg | DELAYED_RELEASE_TABLET | Freq: Two times a day (BID) | ORAL | 0 refills | Status: DC | PRN
Start: 2022-08-22 — End: 2022-08-31
  Filled 2022-08-22: qty 20, 10d supply, fill #0
  Filled 2022-08-24: qty 40, 20d supply, fill #0

## 2022-08-22 NOTE — Telephone Encounter (Signed)
 Please schedule patient a follow up visit. Patient due 08/2022. Thanks!

## 2022-08-22 NOTE — Telephone Encounter (Signed)
Last Fill: 05/16/2022  Labs: 05/16/2022 normal  Next Visit: Due 08/16/2022. Message sent to the front to schedule.   Last Visit: 05/16/2022  DX: Rheumatoid arthritis with negative rheumatoid factor, involving unspecified site   Current Dose per office note 05/16/2022: diclofenac 25 mg twice daily as needed   Contacted the patient to advise labs are due. Patient states she will come get her labs done at the office.   Okay to refill Diclofenac?

## 2022-08-23 ENCOUNTER — Other Ambulatory Visit: Payer: Self-pay | Admitting: Physician Assistant

## 2022-08-23 ENCOUNTER — Other Ambulatory Visit (HOSPITAL_COMMUNITY): Payer: Self-pay

## 2022-08-23 ENCOUNTER — Other Ambulatory Visit: Payer: Self-pay

## 2022-08-23 NOTE — Telephone Encounter (Signed)
Due 8/27

## 2022-08-24 ENCOUNTER — Other Ambulatory Visit: Payer: Self-pay

## 2022-08-24 ENCOUNTER — Other Ambulatory Visit (HOSPITAL_COMMUNITY): Payer: Self-pay

## 2022-08-26 NOTE — Progress Notes (Unsigned)
Office Visit Note  Patient: Grace Butler             Date of Birth: [birth year]           MRN: [id number]             PCP: Pearline Cables, MD Referring: Pearline Cables, MD Visit Date: 08/31/2022   Subjective:  No chief complaint on file.   History of Present Illness: Grace Butler is a 52 y.o. female here for follow up for seropositive RA on Rinvoq 15 mg daily, hydroxychloroquine 400 mg daily, and diclofenac 25 mg twice daily as needed.    Previous HPI 05/16/2022 Grace Butler is a 52 y.o. female here for follow up for seropositive RA on Rinvoq 15 mg daily hydroxychloroquine 400 mg daily and diclofenac 25 mg twice daily as needed.  Somewhat prolonged time since her last follow-up as she has had many major life events going on including change of work and marriage separation.  Without significant flareup over the winter or throughout this time.  Has not had any serious interval infections or complications.  No new falls or injuries.   Previous HPI 08/30/21 Grace Butler is a 52 y.o. female here for follow up for seronegative RA on Rinvoq 15 mg daily hydroxychloroquine 400 mg daily and diclofenac 25 mg as needed.  She has not had any major flareups or exacerbations since her last visit or required any steroid treatment.  No new infections or antibiotic treatment required.  This morning at work she fell describes as tripping over her own foot and landed incurring some abrasion on her right elbow as well as lateral right knee and right hip.   Previous HPI 05/26/2021 Grace Butler is a 52 y.o. female here for follow up  for seronegative RA on Rinvoq 15mg  po qd, HCQ 400 mg PO daily, and diclofenac 25 mg PRN. She is doing well overall since last visit no major flare ups. No infections or antibiotic treatments needed. She does not have many bad days recently. She had lab testing drawn on 05/06/21 with her family medicine office including CBC, CMP, and TSH which were normal. Her wife was  newly diagnosed with colorectal cancer and undergoing neoadjuvant chemoradiation therapy so she has increased stress and missed work time due to that.   Previous HPI 01/19/2021 Grace Butler is a 52 y.o. female here for follow up for seronegative RA on Enbrel 50 mg [place] weekly, HCQ 400 mg PO daily, and diclofenac 25 mg PRN. She has been doing well most of the time without a RA flare up but for the last few weeks having increased stiffness in bilateral legs, also pain but less of a problem than the stiffness. She fell twice without major injury. She has an appointment to start working with a Systems analyst on Thursday plans to do resistance training for strengthening and ideally some weight loss. She is interested in information about Rinvoq after hearing about the medication.   Previous HPI 10/20/20 Grace Butler is a 52 y.o. female here for follow up for seronegative rheumatoid arthritis after restarting treatment with Enbrel 50 mg [place] weekly with continued HCQ 400 mg PO daily. Workup at that visit with negative inflammatory serology, xray of left knee does show osteoarthritis changes. She had been feeling pretty well after starting Enbrel although continuing to take diclofenac 25 mg BID regularly. However in the past few weeks she is feeling worse, with several major  life and family stressors going on. 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 Her joint pain is worse especially knees and ankles. Not especially stiff though. Recent fall onto right elbow and right knee without major injury. Had right knee injection with orthopedic surgery for pain and swelling there, with an improvement.   Previous HPI 07/29/20 Grace Butler is a 52 y.o. female here for transition of care for seronegative rheumatoid arthritis previously managed with [facility].  Rheumatoid arthritis was diagnosed in 2013 with development of bilateral hand and feet swelling and morning stiffness.  Previous treatments include methotrexate stopped due to elevated transaminases.  She was treated with Humira for about 6 months but with increasing injection site reaction and pain did not tolerate this well.  She was switched to Enbrel continued this for several months but also stopped due to the same problem of local injection site reactions and pain.  She is not sure whether the medicine was ever effective due to early discontinuation.  She started [place] in 2016 she felt greatly improved her symptoms continue this medicine until 2021 she does question whether there was loss of efficacy.  After Harriette Ohara she started hydroxychloroquine has been on this since October 2021 does not feel this medication has been highly effective for her joint pain.  Currently she complains of daily pain worst in her bilateral knees and feet.  She describes the pain as sharp but states her foot pain feels more like a pressure sensation.  The knee pain is exacerbated climbing stairs.  She notices stiffness pain and redness over her MCP joints does not describe discrete swelling in this area lately.  She is not sure whether there is knee or ankle swelling related to her arthritis body habitus and swelling and redness she gets in the feet from standing during the day.  Currently she takes Celebrex once daily in the mornings and takes Tylenol usually twice a day for her ongoing joint pain.  She previously took oral diclofenac that she found helpful but was switched due to concern of long-term NSAID exposure.  She takes hydroxychloroquine 400 mg/day.   DMARD Hx MTX d/c LFTs Humira d/c injection site reactions Enbrel d/c injection site reactions (old formulation) Harriette Ohara ?loss of efficacy   Labs  02/2014 HBV/HCV neg RF neg CCP neg   No Rheumatology ROS completed.   PMFS History:   Patient Active Problem List   Diagnosis Date Noted   Acute non-recurrent pansinusitis 03/09/2021   High risk medication use 07/29/2020   Bilateral knee pain 07/29/2020   Attention deficit hyperactivity disorder (ADHD) 09/28/2017   Insomnia, unspecified 09/28/2017   Acute meniscal tear of knee, left, subsequent encounter 02/10/2017   Left knee pain 09/01/2016   Obesity 01/14/2016   Rheumatoid arthritis (HCC) 01/14/2016   Hypothyroidism due to acquired atrophy of thyroid 01/14/2016   Osteopenia 09/15/2015   Bipolar 1 disorder (HCC) 08/21/2015   Gastroesophageal reflux disease without esophagitis 08/21/2015   Migraine 08/21/2015    Past Medical History:  Diagnosis Date   Bipolar 1 disorder (HCC)    Chronic headache    Depression    Gallstone    Hypertension    IBS (irritable bowel syndrome)    Obesity    Rheumatoid arthritis (HCC)    Thyroid disease    Ulcer  Family History  Problem Relation Age of Onset   Hypertension Mother    Heart attack Father    Healthy Sister    Healthy Sister    Healthy Sister    Healthy Brother    Healthy Son    Rheum arthritis Paternal Uncle    Colon cancer Neg Hx    Esophageal cancer Neg Hx    Colon polyps Neg Hx    Rectal cancer Neg Hx    Stomach cancer Neg Hx    Past Surgical History:  Procedure Laterality Date   ABDOMINAL HYSTERECTOMY     APPENDECTOMY     CHOLECYSTECTOMY     COLONOSCOPY  09/07/2018   CYSTECTOMY     dermoid   ESOPHAGOGASTRODUODENOSCOPY     about 20 years ago to check for ulcers   KNEE ARTHROSCOPY Left 2019   MYOMECTOMY     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 49   UPPER GASTROINTESTINAL ENDOSCOPY     Social History   Social History Narrative   Not on file   Immunization History  Administered Date(s) Administered   Influenza,inj,Quad PF,6+ Mos 01/14/2016, 09/13/2017   Influenza-Unspecified 01/14/2016, 10/31/2016, 09/13/2017, 12/17/2017, 08/06/2018   Pneumococcal Conjugate-13 03/24/2014   Pneumococcal  Polysaccharide-23 05/26/2014   Pneumococcal-Unspecified 05/26/2014   Tdap 11/10/2017   Zoster Recombinant(Shingrix) 01/31/2022     Objective: Vital Signs: There were no vitals taken for this visit.   Physical Exam   Musculoskeletal Exam: ***  CDAI Exam: CDAI Score: -- Patient Global: --; Provider Global: -- Swollen: --; Tender: -- Joint Exam 08/31/2022   No joint exam has been documented for this visit   There is currently no information documented on the homunculus. Go to the Rheumatology activity and complete the homunculus joint exam.  Investigation: No additional findings.  Imaging: No results found.  Recent Labs: Lab Results  Component Value Date   WBC 5.6 05/16/2022   HGB 13.0 05/16/2022   PLT 264 05/16/2022   NA 139 05/16/2022   K 4.2 05/16/2022   CL 103 05/16/2022   CO2 25 05/16/2022   GLUCOSE 89 05/16/2022   BUN 9 05/16/2022   CREATININE 0.76 05/16/2022   BILITOT 0.5 05/16/2022   ALKPHOS 99 01/25/2021   AST 22 05/16/2022   ALT 20 05/16/2022   PROT 6.7 05/16/2022   ALBUMIN 4.2 01/25/2021   CALCIUM 9.7 05/16/2022   GFRAA >60 04/23/2019   QFTBGOLDPLUS NEGATIVE 05/16/2022    Speciality Comments: PLQ eye exam 05/20/2021 WNL [facility], [place] f/u 12 months  Patient states she had an eye exam at [facility], waiting on fax  Procedures:  No procedures performed Allergies: Coconut (cocos nucifera), Codeine, Sulfa antibiotics, and Trazodone and nefazodone   Assessment / Plan:     Visit Diagnoses: No diagnosis found.  ***  Orders: No orders of the defined types were placed in this encounter.  No orders of the defined types were placed in this encounter.    Follow-Up Instructions: No follow-ups on file.   Metta Clines, RT  Note - This record has been created using [employer].  Chart creation errors have been sought, but may not always  have been located. Such creation errors do not reflect on  the standard of medical care.

## 2022-08-27 ENCOUNTER — Other Ambulatory Visit (HOSPITAL_COMMUNITY): Payer: Self-pay

## 2022-08-29 ENCOUNTER — Other Ambulatory Visit: Payer: Self-pay | Admitting: Internal Medicine

## 2022-08-29 DIAGNOSIS — M0600 Rheumatoid arthritis without rheumatoid factor, unspecified site: Secondary | ICD-10-CM

## 2022-08-29 NOTE — Telephone Encounter (Signed)
Last Fill: 05/25/2022  Labs: 05/16/2022 normal  02/07/2022 lipid panel  Cholesterol 228 LDL Cholesterol 147  TB Gold: 05/16/2022 negative   Next Visit: 08/31/2022  Last Visit: 05/16/2022  [id number] arthritis with negative rheumatoid factor, involving unspecified site   Current Dose per office note 05/16/2022: Rinvoq 15 mg daily   Patient to update labs at upcomming appointment.   Okay to refill Rinvoq?

## 2022-08-30 ENCOUNTER — Other Ambulatory Visit (HOSPITAL_COMMUNITY): Payer: Self-pay

## 2022-08-30 MED ORDER — AMPHETAMINE-DEXTROAMPHETAMINE 10 MG PO TABS: 10.0000 mg | ORAL_TABLET | Freq: Every day | ORAL | 0 refills | Status: DC

## 2022-08-31 ENCOUNTER — Other Ambulatory Visit (HOSPITAL_COMMUNITY): Payer: Self-pay

## 2022-08-31 ENCOUNTER — Encounter: Payer: Self-pay | Admitting: Internal Medicine

## 2022-08-31 ENCOUNTER — Ambulatory Visit: Payer: Managed Care, Other (non HMO) | Attending: Internal Medicine | Admitting: Internal Medicine

## 2022-08-31 VITALS — BP 138/80 | HR 74 | Resp 14 | Ht 69.0 in | Wt 346.0 lb

## 2022-08-31 DIAGNOSIS — Z79899 Other long term (current) drug therapy: Secondary | ICD-10-CM

## 2022-08-31 DIAGNOSIS — M0600 Rheumatoid arthritis without rheumatoid factor, unspecified site: Secondary | ICD-10-CM

## 2022-08-31 MED ORDER — RINVOQ 15 MG PO TB24
15.0000 mg | ORAL_TABLET | Freq: Every day | ORAL | 2 refills | Status: DC
Start: 2022-08-31 — End: 2022-11-28

## 2022-08-31 MED ORDER — HYDROXYCHLOROQUINE SULFATE 200 MG PO TABS
200.0000 mg | ORAL_TABLET | Freq: Two times a day (BID) | ORAL | 0 refills | Status: DC
Start: 2022-08-31 — End: 2022-11-30

## 2022-08-31 MED ORDER — DICLOFENAC SODIUM 25 MG PO TBEC
25.0000 mg | DELAYED_RELEASE_TABLET | Freq: Two times a day (BID) | ORAL | 0 refills | Status: DC | PRN
Start: 2022-08-31 — End: 2022-11-30

## 2022-09-05 ENCOUNTER — Other Ambulatory Visit: Payer: Self-pay | Admitting: Adult Health

## 2022-09-05 ENCOUNTER — Other Ambulatory Visit: Payer: Self-pay | Admitting: Family Medicine

## 2022-09-05 DIAGNOSIS — E034 Atrophy of thyroid (acquired): Secondary | ICD-10-CM

## 2022-09-06 ENCOUNTER — Other Ambulatory Visit (HOSPITAL_COMMUNITY): Payer: Self-pay

## 2022-09-06 ENCOUNTER — Encounter: Payer: Self-pay | Admitting: Family Medicine

## 2022-09-06 MED ORDER — LEVOTHYROXINE SODIUM 175 MCG PO TABS
175.0000 ug | ORAL_TABLET | Freq: Every day | ORAL | 0 refills | Status: DC
Start: 2022-09-06 — End: 2022-10-17
  Filled 2022-09-09: qty 30, 30d supply, fill #0

## 2022-09-06 MED ORDER — ZEPBOUND 2.5 MG/0.5ML ~~LOC~~ SOAJ
2.5000 mg | SUBCUTANEOUS | 2 refills | Status: DC
  Filled 2022-09-06 – 2022-09-24 (×3): qty 2, 28d supply, fill #0

## 2022-09-06 NOTE — Telephone Encounter (Signed)
RF request is for 30 mg, but 10 mg RF was signed last week. What dose is being asked for RF?

## 2022-09-06 NOTE — Addendum Note (Signed)
Addended by: Abbe Amsterdam C on: 09/06/2022 04:42 PM   Modules accepted: Orders

## 2022-09-07 ENCOUNTER — Other Ambulatory Visit (HOSPITAL_COMMUNITY): Payer: Self-pay

## 2022-09-07 ENCOUNTER — Other Ambulatory Visit: Payer: Self-pay | Admitting: Physician Assistant

## 2022-09-07 ENCOUNTER — Telehealth: Payer: Self-pay | Admitting: Physician Assistant

## 2022-09-07 ENCOUNTER — Encounter: Payer: Self-pay | Admitting: Physician Assistant

## 2022-09-07 MED ORDER — ALPRAZOLAM 1 MG PO TABS
0.5000 mg | ORAL_TABLET | Freq: Two times a day (BID) | ORAL | 0 refills | Status: DC | PRN
  Filled 2022-09-07 – 2022-09-12 (×2): qty 10, 5d supply, fill #0

## 2022-09-07 NOTE — Telephone Encounter (Signed)
Grace Butler called at 10:15 to request a prescription for Xanax or something like it to help her get through a flight she has coming up 9/16.  She is "freaking" out about this flight and would like perhaps 4 pills to help her there and back.  She has appt 9/26.  Send to [place] [facility]

## 2022-09-07 NOTE — Telephone Encounter (Signed)
Due 9/7

## 2022-09-07 NOTE — Telephone Encounter (Signed)
Please see message from patient

## 2022-09-08 ENCOUNTER — Other Ambulatory Visit (HOSPITAL_BASED_OUTPATIENT_CLINIC_OR_DEPARTMENT_OTHER): Payer: Self-pay

## 2022-09-08 ENCOUNTER — Other Ambulatory Visit (HOSPITAL_COMMUNITY): Payer: Self-pay

## 2022-09-09 ENCOUNTER — Other Ambulatory Visit (HOSPITAL_COMMUNITY): Payer: Self-pay

## 2022-09-09 MED ORDER — AMPHETAMINE-DEXTROAMPHET ER 30 MG PO CP24
30.0000 mg | ORAL_CAPSULE | Freq: Every day | ORAL | 0 refills | Status: DC
  Filled 2022-09-12: qty 30, 30d supply, fill #0

## 2022-09-12 ENCOUNTER — Other Ambulatory Visit (HOSPITAL_COMMUNITY): Payer: Self-pay

## 2022-09-21 ENCOUNTER — Other Ambulatory Visit: Payer: Self-pay | Admitting: Family Medicine

## 2022-09-21 ENCOUNTER — Other Ambulatory Visit (HOSPITAL_COMMUNITY): Payer: Self-pay

## 2022-09-21 MED ORDER — OMEPRAZOLE 40 MG PO CPDR
40.0000 mg | DELAYED_RELEASE_CAPSULE | Freq: Two times a day (BID) | ORAL | 0 refills | Status: DC
Start: 2022-09-21 — End: 2022-12-20
  Filled 2022-09-21: qty 180, 90d supply, fill #0

## 2022-09-23 ENCOUNTER — Other Ambulatory Visit: Payer: Self-pay | Admitting: Physician Assistant

## 2022-09-23 ENCOUNTER — Other Ambulatory Visit: Payer: Self-pay | Admitting: Internal Medicine

## 2022-09-23 ENCOUNTER — Other Ambulatory Visit (HOSPITAL_COMMUNITY): Payer: Self-pay

## 2022-09-23 ENCOUNTER — Telehealth: Payer: Managed Care, Other (non HMO) | Admitting: Physician Assistant

## 2022-09-23 DIAGNOSIS — B9689 Other specified bacterial agents as the cause of diseases classified elsewhere: Secondary | ICD-10-CM | POA: Diagnosis not present

## 2022-09-23 DIAGNOSIS — J019 Acute sinusitis, unspecified: Secondary | ICD-10-CM

## 2022-09-23 DIAGNOSIS — M0600 Rheumatoid arthritis without rheumatoid factor, unspecified site: Secondary | ICD-10-CM

## 2022-09-23 MED ORDER — AMOXICILLIN-POT CLAVULANATE 875-125 MG PO TABS: 1.0000 | ORAL_TABLET | Freq: Two times a day (BID) | ORAL | 0 refills | Status: DC

## 2022-09-23 NOTE — Progress Notes (Signed)

## 2022-09-24 ENCOUNTER — Other Ambulatory Visit (HOSPITAL_COMMUNITY): Payer: Self-pay

## 2022-09-25 ENCOUNTER — Other Ambulatory Visit (HOSPITAL_COMMUNITY): Payer: Self-pay

## 2022-09-25 MED ORDER — GABAPENTIN 300 MG PO CAPS
300.0000 mg | ORAL_CAPSULE | Freq: Every day | ORAL | 0 refills | Status: DC
  Filled 2022-09-25: qty 30, 30d supply, fill #0

## 2022-09-26 ENCOUNTER — Other Ambulatory Visit: Payer: Self-pay

## 2022-09-26 ENCOUNTER — Other Ambulatory Visit (HOSPITAL_COMMUNITY): Payer: Self-pay

## 2022-09-29 ENCOUNTER — Encounter: Payer: Self-pay | Admitting: Physician Assistant

## 2022-09-29 ENCOUNTER — Telehealth (INDEPENDENT_AMBULATORY_CARE_PROVIDER_SITE_OTHER): Payer: 59 | Admitting: Physician Assistant

## 2022-09-29 DIAGNOSIS — F902 Attention-deficit hyperactivity disorder, combined type: Secondary | ICD-10-CM

## 2022-09-29 DIAGNOSIS — F40243 Fear of flying: Secondary | ICD-10-CM

## 2022-09-29 DIAGNOSIS — F319 Bipolar disorder, unspecified: Secondary | ICD-10-CM

## 2022-09-29 DIAGNOSIS — Z635 Disruption of family by separation and divorce: Secondary | ICD-10-CM

## 2022-09-29 DIAGNOSIS — G43909 Migraine, unspecified, not intractable, without status migrainosus: Secondary | ICD-10-CM

## 2022-09-29 MED ORDER — AMPHETAMINE-DEXTROAMPHETAMINE 10 MG PO TABS: 10.0000 mg | ORAL_TABLET | Freq: Every day | ORAL | 0 refills | Status: DC

## 2022-09-29 MED ORDER — AMPHETAMINE-DEXTROAMPHET ER 30 MG PO CP24
30.0000 mg | ORAL_CAPSULE | Freq: Every day | ORAL | 0 refills | Status: DC
  Filled 2022-10-12: qty 30, 30d supply, fill #0

## 2022-09-29 MED ORDER — ARIPIPRAZOLE 15 MG PO TABS: 15.0000 mg | ORAL_TABLET | Freq: Every day | ORAL | 1 refills | Status: DC

## 2022-09-29 MED ORDER — AMITRIPTYLINE HCL 50 MG PO TABS
50.0000 mg | ORAL_TABLET | Freq: Every evening | ORAL | 1 refills | Status: DC
Start: 2022-09-29 — End: 2023-03-22

## 2022-09-29 MED ORDER — GABAPENTIN 300 MG PO CAPS: 300.0000 mg | ORAL_CAPSULE | Freq: Every day | ORAL | 1 refills | Status: DC

## 2022-09-29 MED ORDER — AMPHETAMINE-DEXTROAMPHETAMINE 10 MG PO TABS
10.0000 mg | ORAL_TABLET | Freq: Every day | ORAL | 0 refills | Status: DC
Start: 2022-11-10 — End: 2023-03-29
  Filled 2022-11-11 – 2022-11-24 (×2): qty 30, 30d supply, fill #0

## 2022-09-29 MED ORDER — AMPHETAMINE-DEXTROAMPHET ER 30 MG PO CP24
30.0000 mg | ORAL_CAPSULE | Freq: Every day | ORAL | 0 refills | Status: DC
  Filled 2022-12-10: qty 30, 30d supply, fill #0

## 2022-09-29 MED ORDER — AMPHETAMINE-DEXTROAMPHET ER 30 MG PO CP24
30.0000 mg | ORAL_CAPSULE | Freq: Every day | ORAL | 0 refills | Status: DC
  Filled 2022-11-10: qty 30, 30d supply, fill #0

## 2022-09-29 NOTE — Progress Notes (Signed)
[facility]  Patient ID: Grace Butler,  MRN: [id number]  PCP: Pearline Cables, MD  Date of Evaluation: 09/29/2022 Time spent:30 minutes  Chief Complaint:  Chief Complaint   Depression    Virtual Visit via Telehealth  I connected with patient by a video enabled telemedicine application with their informed consent, and verified patient privacy and that I am speaking with the correct person using two identifiers.  I am private, in my office and the patient is at work.  I discussed the limitations, risks, security and privacy concerns of performing an evaluation and management service by video and the availability of in person appointments. I also discussed with the patient that there may be a patient responsible charge related to this service. The patient expressed understanding and agreed to proceed.   I discussed the assessment and treatment plan with the patient. The patient was provided an opportunity to ask questions and all were answered. The patient agreed with the plan and demonstrated an understanding of the instructions.   The patient was advised to call back or seek an in-person evaluation if the symptoms worsen or if the condition fails to improve as anticipated.  I provided 30 minutes of non-face-to-face time during this encounter.  HISTORY/CURRENT STATUS: HPI for 53-month med check.  She and her wife have split up. She's had a hard time with it, but at the same time, feels that it's for the best. They've had probs for a long time. If it wasn't for that stress, she'd be doing ok. See Social hx.  Patient is able to enjoy things.  Energy and motivation are good.  Work is going well.   No extreme sadness, tearfulness, or feelings of hopelessness.  Sleeps well most of the time. ADLs and personal hygiene are normal.   No decreased memory. Appetite has not changed.  Weight is stable.  Has anxiety occas but only takes the Xanax before flying. It's still effective.   Denies suicidal or homicidal thoughts.  States that attention is good without easy distractibility.  Able to focus on things and finish tasks to completion.   Patient denies increased energy with decreased need for sleep, increased talkativeness, racing thoughts, impulsivity or risky behaviors, increased spending, increased libido, grandiosity, increased irritability or anger, paranoia, or hallucinations.  Denies dizziness, syncope, seizures, numbness, tingling, tremor, tics, unsteady gait, slurred speech, confusion. Denies muscle or joint pain, stiffness, or dystonia. Denies unexplained weight loss, frequent infections, or sores that heal slowly.  No polyphagia, polydipsia, or polyuria. Denies visual changes or paresthesias.   Individual Medical History/ Review of Systems: Changes? :No   RA is stable.  Past medications for mental health diagnoses include: Adderall XR, Seroquel, Abilify, trazodone, Serzone, Elavil, Xanax, Ambien caused vivid nightmares  Allergies: Coconut (cocos nucifera), Codeine, Sulfa antibiotics, and Trazodone and nefazodone  Current Medications:  Current Outpatient Medications:    ALPRAZolam (XANAX) 1 MG tablet, Take 1/2-1 tablet (0.5-1 mg total) by mouth 2 (two) times daily as needed for anxiety associated with flying, Disp: 10 tablet, Rfl: 0   amoxicillin-clavulanate (AUGMENTIN) 875-125 MG tablet, Take 1 tablet by mouth 2 (two) times daily., Disp: 14 tablet, Rfl: 0   Ascorbic Acid (VITAMIN C) 100 MG tablet, Take 100 mg by mouth daily., Disp: , Rfl:    butalbital-acetaminophen-caffeine (FIORICET) 50-325-40 MG tablet, Take 1-2 tablets by mouth every 6 (six) hours as needed for headache. Max 6 per 24 hours, Disp: 14 tablet, Rfl: 0   calcium citrate-vitamin D (CITRACAL+D)  315-200 MG-UNIT tablet, Take 1 tablet by mouth 2 (two) times daily., Disp: , Rfl:    cholecalciferol (VITAMIN D) 1000 UNITS tablet, Take 1,000 Units by mouth daily., Disp: , Rfl:    diclofenac (VOLTAREN)  25 MG EC tablet, Take 1 tablet (25 mg) by mouth 2 times daily as needed., Disp: 180 tablet, Rfl: 0   hydroxychloroquine (PLAQUENIL) 200 MG tablet, Take 1 tablet by mouth 2 times daily., Disp: 180 tablet, Rfl: 0   levothyroxine (SYNTHROID) 175 MCG tablet, Take 1 tablet (175 mcg total) by mouth daily before breakfast., Disp: 90 tablet, Rfl: 0   lisinopril (ZESTRIL) 10 MG tablet, Take 1 tablet (10 mg total) by mouth daily., Disp: 90 tablet, Rfl: 3   omeprazole (PRILOSEC) 40 MG capsule, Take 1 capsule (40 mg total) by mouth in the morning and at bedtime., Disp: 180 capsule, Rfl: 0   QUEtiapine (SEROQUEL) 50 MG tablet, Take 3 tablets (150 mg total) by mouth at bedtime., Disp: 270 tablet, Rfl: 0   rizatriptan (MAXALT-MLT) 10 MG disintegrating tablet, Take 1 tablet (10 mg total) by mouth as needed for migraine. May repeat in 2 hours if needed, Disp: 10 tablet, Rfl: 0   Specialty Vitamins Products (MAGNESIUM, AMINO ACID CHELATE,) 133 MG tablet, Take 1 tablet by mouth 2 (two) times daily., Disp: , Rfl:    tirzepatide (ZEPBOUND) 2.5 MG/0.5ML Pen, Inject 2.5 mg into the skin once a week., Disp: 2 mL, Rfl: 2   Upadacitinib ER (RINVOQ) 15 MG TB24, Take 1 tablet (15 mg) by mouth daily., Disp: 30 tablet, Rfl: 2   valACYclovir (VALTREX) 1000 MG tablet, Take 1 tablet (1,000 mg total) by mouth daily. Take for 5 days for outbreak as needed, Disp: 30 tablet, Rfl: 1   amitriptyline (ELAVIL) 50 MG tablet, Take 1 tablet (50 mg total) by mouth every evening., Disp: 90 tablet, Rfl: 1   [START ON 10/11/2022] amphetamine-dextroamphetamine (ADDERALL XR) 30 MG 24 hr capsule, Take 1 capsule (30 mg total) by mouth daily., Disp: 30 capsule, Rfl: 0   [START ON 12/09/2022] amphetamine-dextroamphetamine (ADDERALL XR) 30 MG 24 hr capsule, Take 1 capsule (30 mg total) by mouth daily., Disp: 30 capsule, Rfl: 0   [START ON 11/10/2022] amphetamine-dextroamphetamine (ADDERALL XR) 30 MG 24 hr capsule, Take 1 capsule (30 mg total) by mouth daily.,  Disp: 30 capsule, Rfl: 0   [START ON 10/11/2022] amphetamine-dextroamphetamine (ADDERALL) 10 MG tablet, Take 1 tablet (10 mg total) by mouth daily with lunch., Disp: 30 tablet, Rfl: 0   [START ON 11/10/2022] amphetamine-dextroamphetamine (ADDERALL) 10 MG tablet, Take 1 tablet (10 mg total) by mouth daily with lunch., Disp: 30 tablet, Rfl: 0   [START ON 12/09/2022] amphetamine-dextroamphetamine (ADDERALL) 10 MG tablet, Take 1 tablet (10 mg total) by mouth daily with lunch., Disp: 30 tablet, Rfl: 0   ARIPiprazole (ABILIFY) 15 MG tablet, Take 1 tablet (15 mg total) by mouth daily., Disp: 90 tablet, Rfl: 1   baclofen (LIORESAL) 10 MG tablet, Take 1 tablet (10 mg total) by mouth 3 (three) times daily. (Patient not taking: Reported on 08/31/2022), Disp: 30 each, Rfl: 0   furosemide (LASIX) 20 MG tablet, Take 1 tablet (20 mg total) by mouth daily as needed for leg edema (Patient not taking: Reported on 05/16/2022), Disp: 30 tablet, Rfl: 3   gabapentin (NEURONTIN) 300 MG capsule, Take 1 capsule (300 mg total) by mouth at bedtime., Disp: 90 capsule, Rfl: 1   Zoster Vaccine Adjuvanted [facility]) injection, Inject into the muscle. (Patient not  taking: Reported on 05/16/2022), Disp: 1 mL, Rfl: 0 Medication Side Effects: none  Family Medical/ Social History: Changes?  She and wife have split, it was Sharon's decision not Romaine's but Sarina now understands that it needed to happen for a long time.  She has asked for divorce now.  She moved into her own apartment about 2 months ago.  MENTAL HEALTH EXAM:  There were no vitals taken for this visit.There is no height or weight on file to calculate BMI.  General Appearance: Casual and Well Groomed  Eye Contact:  Good  Speech:  Clear and Coherent and Normal Rate  Volume:  Normal  Mood:  Euthymic  Affect:  Congruent  Thought Process:  Goal Directed and Descriptions of Associations: Intact  Orientation:  Full (Time, Place, and Person)  Thought Content: Logical    Suicidal Thoughts:  No  Homicidal Thoughts:  No  Memory:  WNL  Judgement:  Good  Insight:  Good  Psychomotor Activity:  Normal  Concentration:  Concentration: Good and Attention Span: Good  Recall:  Good  Fund of Knowledge: Good  Language: Good  Assets:  Desire for Improvement Financial Resources/Insurance Housing Transportation Vocational/Educational  ADL's:  Intact  Cognition: WNL  Prognosis:  Good    PCP follows labs  DIAGNOSES:    ICD-10-CM   1. Bipolar I disorder (HCC)  F31.9     2. Migraine without status migrainosus, not intractable, unspecified migraine type  G43.909 amitriptyline (ELAVIL) 50 MG tablet    3. Marriage separation  Z63.5     4. Attention deficit hyperactivity disorder (ADHD), combined type  F90.2     5. 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  Gabapentin will 09/26/2022.  Xanax filled 09/12/2022.  Adderall filled 09/12/2022. I provided 30 minutes of non-face-to-face time during this encounter, including time spent before and after the visit in records review, medical decision making, counseling pertinent to today's visit, and charting.   I'm sorry to hear about her and her wife.   Continue Xanax 1 mg before flying prn. Continue amitriptyline 50 mg, 1 p.o. nightly for migraine prevention. Continue Adderall XR 30 mg, 1 q am. Continue Adderall 10 mg, 1 at lunch.  Continue Abilify 15 mg, 1 p.o. daily. Continue gabapentin 300 mg, 1 p.o. nightly. Continue Seroquel 50 mg, 3 p.o. nightly.   Strongly recommend counseling.  Return in 6 months.  Melony Overly, PA-C

## 2022-09-30 ENCOUNTER — Other Ambulatory Visit (HOSPITAL_COMMUNITY): Payer: Self-pay

## 2022-10-01 ENCOUNTER — Telehealth: Payer: Managed Care, Other (non HMO)

## 2022-10-01 ENCOUNTER — Telehealth: Payer: Managed Care, Other (non HMO) | Admitting: Family Medicine

## 2022-10-01 DIAGNOSIS — B3731 Acute candidiasis of vulva and vagina: Secondary | ICD-10-CM | POA: Diagnosis not present

## 2022-10-01 MED ORDER — FLUCONAZOLE 150 MG PO TABS: 150.0000 mg | ORAL_TABLET | Freq: Once | ORAL | 0 refills | Status: AC

## 2022-10-01 NOTE — Progress Notes (Signed)

## 2022-10-03 ENCOUNTER — Other Ambulatory Visit: Payer: Self-pay

## 2022-10-03 DIAGNOSIS — M0600 Rheumatoid arthritis without rheumatoid factor, unspecified site: Secondary | ICD-10-CM

## 2022-10-03 DIAGNOSIS — Z79899 Other long term (current) drug therapy: Secondary | ICD-10-CM

## 2022-10-04 ENCOUNTER — Encounter: Payer: Self-pay | Admitting: Family Medicine

## 2022-10-04 DIAGNOSIS — N951 Menopausal and female climacteric states: Secondary | ICD-10-CM

## 2022-10-04 LAB — CBC WITH DIFFERENTIAL/PLATELET
Absolute Monocytes: 290 {cells}/uL (ref 200–950)
Basophils Absolute: 21 {cells}/uL (ref 0–200)
Basophils Relative: 0.5 %
Eosinophils Absolute: 71 {cells}/uL (ref 15–500)
Eosinophils Relative: 1.7 %
HCT: 40.1 % (ref 35.0–45.0)
Hemoglobin: 13.1 g/dL (ref 11.7–15.5)
Lymphs Abs: 1588 {cells}/uL (ref 850–3900)
MCH: 32.3 pg (ref 27.0–33.0)
MCHC: 32.7 g/dL (ref 32.0–36.0)
MCV: 98.8 fL (ref 80.0–100.0)
MPV: 9.8 fL (ref 7.5–12.5)
Monocytes Relative: 6.9 %
Neutro Abs: 2230 {cells}/uL (ref 1500–7800)
Neutrophils Relative %: 53.1 %
Platelets: 312 10*3/uL (ref 140–400)
RBC: 4.06 10*6/uL (ref 3.80–5.10)
RDW: 12.5 % (ref 11.0–15.0)
Total Lymphocyte: 37.8 %
WBC: 4.2 10*3/uL (ref 3.8–10.8)

## 2022-10-04 LAB — COMPLETE METABOLIC PANEL WITH GFR
AG Ratio: 1.5 (calc) (ref 1.0–2.5)
ALT: 27 U/L (ref 6–29)
AST: 29 U/L (ref 10–35)
Albumin: 4.4 g/dL (ref 3.6–5.1)
Alkaline phosphatase (APISO): 84 U/L (ref 37–153)
BUN: 8 mg/dL (ref 7–25)
CO2: 26 mmol/L (ref 20–32)
Calcium: 9.6 mg/dL (ref 8.6–10.4)
Chloride: 103 mmol/L (ref 98–110)
Creat: 0.8 mg/dL (ref 0.50–1.03)
Globulin: 2.9 g/dL (ref 1.9–3.7)
Glucose, Bld: 89 mg/dL (ref 65–99)
Potassium: 4.6 mmol/L (ref 3.5–5.3)
Sodium: 139 mmol/L (ref 135–146)
Total Bilirubin: 0.5 mg/dL (ref 0.2–1.2)
Total Protein: 7.3 g/dL (ref 6.1–8.1)
eGFR: 89 mL/min/{1.73_m2} (ref >=60)

## 2022-10-04 LAB — SEDIMENTATION RATE: Sed Rate: 2 mm/h (ref 0–30)

## 2022-10-05 NOTE — Addendum Note (Signed)
Addended by: Abbe Amsterdam C on: 10/05/2022 09:15 PM   Modules accepted: Orders

## 2022-10-12 ENCOUNTER — Other Ambulatory Visit (HOSPITAL_COMMUNITY): Payer: Self-pay

## 2022-10-17 ENCOUNTER — Encounter: Payer: Self-pay | Admitting: Family Medicine

## 2022-10-17 ENCOUNTER — Other Ambulatory Visit: Payer: Self-pay | Admitting: Family Medicine

## 2022-10-17 DIAGNOSIS — E034 Atrophy of thyroid (acquired): Secondary | ICD-10-CM

## 2022-10-17 MED ORDER — LEVOTHYROXINE SODIUM 175 MCG PO TABS
175.0000 ug | ORAL_TABLET | Freq: Every day | ORAL | 3 refills | Status: DC
Start: 2022-10-17 — End: 2023-09-20

## 2022-10-19 ENCOUNTER — Other Ambulatory Visit: Payer: Self-pay

## 2022-10-19 MED ORDER — QUETIAPINE FUMARATE 50 MG PO TABS: 150.0000 mg | ORAL_TABLET | Freq: Every day | ORAL | 0 refills | Status: DC

## 2022-10-21 ENCOUNTER — Other Ambulatory Visit: Payer: Self-pay

## 2022-10-21 DIAGNOSIS — I10 Essential (primary) hypertension: Secondary | ICD-10-CM

## 2022-10-21 MED ORDER — LISINOPRIL 10 MG PO TABS: 10.0000 mg | ORAL_TABLET | Freq: Every day | ORAL | 0 refills | Status: DC

## 2022-10-24 ENCOUNTER — Other Ambulatory Visit (HOSPITAL_COMMUNITY): Payer: Self-pay

## 2022-10-25 ENCOUNTER — Other Ambulatory Visit (HOSPITAL_COMMUNITY): Payer: Self-pay

## 2022-10-25 ENCOUNTER — Telehealth: Payer: Self-pay | Admitting: Physician Assistant

## 2022-10-25 ENCOUNTER — Other Ambulatory Visit: Payer: Self-pay

## 2022-10-25 MED ORDER — QUETIAPINE FUMARATE 50 MG PO TABS
150.0000 mg | ORAL_TABLET | Freq: Every day | ORAL | 0 refills | Status: DC
  Filled 2022-10-25: qty 21, 7d supply, fill #0

## 2022-10-25 NOTE — Telephone Encounter (Signed)
PT called and would like a weeks worth of her seroquel sent to the [facility] pharmacy. She is waiting on mail order and it will not be filled until sunday

## 2022-10-25 NOTE — Telephone Encounter (Signed)
Sent!

## 2022-10-25 NOTE — Telephone Encounter (Signed)
Noted  

## 2022-10-26 ENCOUNTER — Other Ambulatory Visit: Payer: Self-pay | Admitting: Family Medicine

## 2022-10-26 ENCOUNTER — Other Ambulatory Visit: Payer: Self-pay | Admitting: Physician Assistant

## 2022-10-26 ENCOUNTER — Other Ambulatory Visit (HOSPITAL_COMMUNITY): Payer: Self-pay

## 2022-10-26 MED ORDER — RIZATRIPTAN BENZOATE 10 MG PO TBDP
10.0000 mg | ORAL_TABLET | ORAL | 3 refills | Status: DC | PRN
  Filled 2022-10-26: qty 10, 15d supply, fill #0

## 2022-11-10 ENCOUNTER — Other Ambulatory Visit (HOSPITAL_COMMUNITY): Payer: Self-pay

## 2022-11-11 ENCOUNTER — Other Ambulatory Visit (HOSPITAL_COMMUNITY): Payer: Self-pay

## 2022-11-12 ENCOUNTER — Encounter: Payer: Self-pay | Admitting: Internal Medicine

## 2022-11-13 ENCOUNTER — Other Ambulatory Visit: Payer: Self-pay | Admitting: Physician Assistant

## 2022-11-13 ENCOUNTER — Telehealth: Payer: Self-pay | Admitting: Physician Assistant

## 2022-11-13 MED ORDER — PREDNISONE 5 MG PO TABS: ORAL_TABLET | ORAL | 0 refills | Status: DC

## 2022-11-13 NOTE — Telephone Encounter (Signed)
Patient contacted the on-call service regarding a flare she is currently having.  Patient states the RA flare started Friday morning and has progressively been worsening.  She is having increased pain in both hands and significant pain and swelling in both knees.  Yesterday she was having difficulty ambulating and today her symptoms have not improved.  Patient states she sent a message to Dr. Dimple Casey yesterday on MyChart.  Patient requested a prednisone taper to be sent to the pharmacy today.  She has tolerated prednisone in the past without any side effects.   A prednisone taper starting at 20 mg tapering by 5 mg every 3 days was sent to the pharmacy.   Patient was advised to call our office this week if her symptoms persist or worsen.  All questions were addressed.   Sherron Ales, PA-C

## 2022-11-14 NOTE — Telephone Encounter (Signed)
Patient prescribed Diclofenac, Plaquenil, and Rinvoq. Please advise.

## 2022-11-21 ENCOUNTER — Other Ambulatory Visit (HOSPITAL_COMMUNITY): Payer: Self-pay

## 2022-11-22 ENCOUNTER — Other Ambulatory Visit (HOSPITAL_COMMUNITY): Payer: Self-pay

## 2022-11-24 ENCOUNTER — Other Ambulatory Visit (HOSPITAL_COMMUNITY): Payer: Self-pay

## 2022-11-28 ENCOUNTER — Telehealth: Payer: Self-pay | Admitting: Internal Medicine

## 2022-11-28 ENCOUNTER — Other Ambulatory Visit: Payer: Self-pay | Admitting: Internal Medicine

## 2022-11-28 DIAGNOSIS — Z79899 Other long term (current) drug therapy: Secondary | ICD-10-CM

## 2022-11-28 DIAGNOSIS — M0600 Rheumatoid arthritis without rheumatoid factor, unspecified site: Secondary | ICD-10-CM

## 2022-11-28 NOTE — Telephone Encounter (Signed)
Contacted the patient. The Lipid Panel is pended and the patient has scheduled a follow up appointment for 11/30/2022.

## 2022-11-28 NOTE — Telephone Encounter (Signed)
Lipid panel lab pended. Patient scheduled an appointment for 11/30/2022.

## 2022-11-28 NOTE — Telephone Encounter (Signed)
Please schedule patient a follow up visit. Patient due 11/2022. Thanks!

## 2022-11-28 NOTE — Addendum Note (Signed)
Addended by: Metta Clines on: 11/28/2022 10:04 AM   Modules accepted: Orders

## 2022-11-28 NOTE — Telephone Encounter (Signed)
Last Fill: 08/31/2022  Labs: 10/03/2022 WNL  02/07/2022 Lipid Panel Cholesterol 228 LDL 147  TB Gold: 05/16/2022 Negative   Next Visit: Due 12/01/2022. Message sent to the front to schedule.   Last Visit: 08/31/2022  [id number] arthritis with negative rheumatoid factor, involving unspecified site   Current Dose per office note 08/31/2022: Rinvoq 15 mg daily   Attempted to contact the patient and left a message that a lab is due and advised the patient of our walk in lab hours.   Okay to refill Rinvoq?

## 2022-11-28 NOTE — Telephone Encounter (Signed)
Attempted to contact the patient back to advise we had a refill request for her Rinvoq and her lipid panel lab is due and she needs to schedule an appointment. Left the patient a message to call the office back.

## 2022-11-28 NOTE — Telephone Encounter (Signed)
Pt called stating she received a phone call from our office and there was no voicemail. Pt was calling back.

## 2022-11-28 NOTE — Progress Notes (Signed)
Sedimentation rate remains normal. CBC and CMP also normal no problem for continuing Rinvoq and diclofenac.

## 2022-11-30 ENCOUNTER — Other Ambulatory Visit: Payer: Self-pay | Admitting: Internal Medicine

## 2022-11-30 ENCOUNTER — Ambulatory Visit: Payer: Managed Care, Other (non HMO) | Admitting: Internal Medicine

## 2022-11-30 ENCOUNTER — Other Ambulatory Visit: Payer: Self-pay

## 2022-11-30 DIAGNOSIS — M0600 Rheumatoid arthritis without rheumatoid factor, unspecified site: Secondary | ICD-10-CM

## 2022-11-30 NOTE — Telephone Encounter (Signed)
Pt had an appointment today 11/27 and had to cancel due to work. Pt rescheduled and would like to know if she could still get a refill on her medications. Pt rescheduled for 01/30/2023 at 3:40p.

## 2022-11-30 NOTE — Progress Notes (Deleted)
Office Visit Note  Patient: Grace Butler             Date of Birth: November 03, 1970           MRN: [id number]             PCP: Pearline Cables, MD Referring: Pearline Cables, MD Visit Date: 11/30/2022   Subjective:  No chief complaint on file.   History of Present Illness: Grace Butler is a 52 y.o. female here for follow up ***   Previous HPI 08/31/22 Grace Butler is a 52 y.o. female here for follow up for seropositive RA on Rinvoq 15 mg daily, hydroxychloroquine 400 mg daily, and diclofenac 25 mg twice daily as needed.  Overall hip symptoms have been well-controlled without any major exacerbation and no significant illness.  Earlier this month she did experience an increase in low back pain also a popping sensation of the right shoulder with some pain mostly just from certain arm movements.  This was all during repetitive lifting activities associate with a move.  She took muscle relaxer and 5-day course of prednisone 20 mg symptoms are improved.     Previous HPI 05/16/2022 Grace Butler is a 52 y.o. female here for follow up for seropositive RA on Rinvoq 15 mg daily hydroxychloroquine 400 mg daily and diclofenac 25 mg twice daily as needed.  Somewhat prolonged time since her last follow-up as she has had many major life events going on including change of work and marriage separation.  Without significant flareup over the winter or throughout this time.  Has not had any serious interval infections or complications.  No new falls or injuries.   Previous HPI 08/30/21 Grace Butler is a 52 y.o. female here for follow up for seronegative RA on Rinvoq 15 mg daily hydroxychloroquine 400 mg daily and diclofenac 25 mg as needed.  She has not had any major flareups or exacerbations since her last visit or required any steroid treatment.  No new infections or antibiotic treatment required.  This morning at work she fell describes as tripping over her own foot and landed incurring some  abrasion on her right elbow as well as lateral right knee and right hip.   Previous HPI 05/26/2021 Grace Butler is a 52 y.o. female here for follow up  for seronegative RA on Rinvoq 15mg  po qd, HCQ 400 mg PO daily, and diclofenac 25 mg PRN. She is doing well overall since last visit no major flare ups. No infections or antibiotic treatments needed. She does not have many bad days recently. She had lab testing drawn on 05/06/21 with her family medicine office including CBC, CMP, and TSH which were normal. Her wife was newly diagnosed with colorectal cancer and undergoing neoadjuvant chemoradiation therapy so she has increased stress and missed work time due to that.   Previous HPI 01/19/2021 Grace Butler is a 52 y.o. female here for follow up for seronegative RA on Enbrel 50 mg [place] weekly, HCQ 400 mg PO daily, and diclofenac 25 mg PRN. She has been doing well most of the time without a RA flare up but for the last few weeks having increased stiffness in bilateral legs, also pain but less of a problem than the stiffness. She fell twice without major injury. She has an appointment to start working with a Systems analyst on Thursday plans to do resistance training for strengthening and ideally some weight loss. She is interested in information  about Rinvoq after hearing about the medication.   Previous HPI 10/20/20 Grace Butler is a 52 y.o. female here for follow up for seronegative rheumatoid arthritis after restarting treatment with Enbrel 50 mg  weekly with continued HCQ 400 mg PO daily. Workup at that visit with negative inflammatory serology, xray of left knee does show osteoarthritis changes. She had been feeling pretty well after starting Enbrel although continuing to take diclofenac 25 mg BID regularly. However in the past few weeks she is feeling worse, with several major life and family stressors going on. 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 Her joint pain is worse especially knees and ankles. Not especially stiff though. Recent fall onto right elbow and right knee without major injury. Had right knee injection with orthopedic surgery for pain and swelling there, with an improvement.   Previous HPI 07/29/20 Grace Butler is a 52 y.o. female here for transition of care for seronegative rheumatoid arthritis previously managed with [facility] rheumatology.  Rheumatoid arthritis was diagnosed in 2013 with development of bilateral hand and feet swelling and morning stiffness.  Previous treatments include methotrexate stopped due to elevated transaminases.  She was treated with Humira for about 6 months but with increasing injection site reaction and pain did not tolerate this well.  She was switched to Enbrel continued this for several months but also stopped due to the same problem of local injection site reactions and pain.  She is not sure whether the medicine was ever effective due to early discontinuation.  She started [place] in 2016 she felt greatly improved her symptoms continue this medicine until 2021 she does question whether there was loss of efficacy.  After Harriette Ohara she started hydroxychloroquine has been on this since October 2021 does not feel this medication has been highly effective for her joint pain.  Currently she complains of daily pain worst in her bilateral knees and feet.  She describes the pain as sharp but states her foot pain feels more like a pressure sensation.  The knee pain is exacerbated climbing stairs.  She notices stiffness pain and redness over her MCP joints does not describe discrete swelling in this area lately.  She is not sure whether there is knee or ankle swelling related to her arthritis body habitus and swelling and redness she gets in the feet from standing during the day.  Currently  she takes Celebrex once daily in the mornings and takes Tylenol usually twice a day for her ongoing joint pain.  She previously took oral diclofenac that she found helpful but was switched due to concern of long-term NSAID exposure.  She takes hydroxychloroquine 400 mg/day.   DMARD Hx MTX d/c LFTs Humira d/c injection site reactions Enbrel d/c injection site reactions (old formulation) Harriette Ohara ?loss of efficacy   Labs  02/2014 HBV/HCV neg RF neg CCP neg   No Rheumatology ROS completed.   PMFS History:  Patient Active Problem List   Diagnosis Date Noted   Acute non-recurrent pansinusitis 03/09/2021   High risk medication use 07/29/2020   Bilateral knee pain 07/29/2020   Attention deficit hyperactivity disorder (ADHD) 09/28/2017   Insomnia, unspecified 09/28/2017   Acute meniscal tear of knee, left, subsequent encounter 02/10/2017   Left knee pain 09/01/2016   Obesity 01/14/2016   Rheumatoid arthritis (HCC) 01/14/2016  Hypothyroidism due to acquired atrophy of thyroid 01/14/2016   Osteopenia 09/15/2015   Bipolar 1 disorder (HCC) 08/21/2015   Gastroesophageal reflux disease without esophagitis 08/21/2015   Migraine 08/21/2015    Past Medical History:  Diagnosis Date   Bipolar 1 disorder (HCC)    Chronic headache    Depression    Gallstone    Hypertension    IBS (irritable bowel syndrome)    Obesity    Rheumatoid arthritis (HCC)    Thyroid disease    Ulcer     Family History  Problem Relation Age of Onset   Hypertension Mother    Heart attack Father    Healthy Sister    Healthy Sister    Healthy Sister    Healthy Brother    Healthy Son    Rheum arthritis Paternal Uncle    Colon cancer Neg Hx    Esophageal cancer Neg Hx    Colon polyps Neg Hx    Rectal cancer Neg Hx    Stomach cancer Neg Hx    Past Surgical History:  Procedure Laterality Date   ABDOMINAL HYSTERECTOMY     APPENDECTOMY     CHOLECYSTECTOMY     COLONOSCOPY  09/07/2018   CYSTECTOMY      dermoid   ESOPHAGOGASTRODUODENOSCOPY     about 20 years ago to check for ulcers   KNEE ARTHROSCOPY Left 2019   MYOMECTOMY     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 13   UPPER GASTROINTESTINAL ENDOSCOPY     Social History   Social History Narrative   Not on file   Immunization History  Administered Date(s) Administered   Influenza,inj,Quad PF,6+ Mos 01/14/2016, 09/13/2017   Influenza-Unspecified 01/14/2016, 10/31/2016, 09/13/2017, 12/17/2017, 08/06/2018   Pneumococcal Conjugate-13 03/24/2014   Pneumococcal Polysaccharide-23 05/26/2014   Pneumococcal-Unspecified 05/26/2014   Tdap 11/10/2017   Zoster Recombinant(Shingrix) 01/31/2022     Objective: Vital Signs: There were no vitals taken for this visit.   Physical Exam   Musculoskeletal Exam: ***  CDAI Exam: CDAI Score: -- Patient Global: --; Provider Global: -- Swollen: --; Tender: -- Joint Exam 11/30/2022   No joint exam has been documented for this visit   There is currently no information documented on the homunculus. Go to the Rheumatology activity and complete the homunculus joint exam.  Investigation: No additional findings.  Imaging: No results found.  Recent Labs: Lab Results  Component Value Date   WBC 4.2 10/03/2022   HGB 13.1 10/03/2022   PLT 312 10/03/2022   NA 139 10/03/2022   K 4.6 10/03/2022   CL 103 10/03/2022   CO2 26 10/03/2022   GLUCOSE 89 10/03/2022   BUN 8 10/03/2022   CREATININE 0.80 10/03/2022   BILITOT 0.5 10/03/2022   ALKPHOS 99 01/25/2021   AST 29 10/03/2022   ALT 27 10/03/2022   PROT 7.3 10/03/2022   ALBUMIN 4.2 01/25/2021   CALCIUM 9.6 10/03/2022   GFRAA >60 04/23/2019   QFTBGOLDPLUS NEGATIVE 05/16/2022    Speciality Comments: PLQ eye exam 05/20/2021 WNL [facility], [place] f/u 12 months  Patient states she had an eye exam at [facility], waiting on fax  Procedures:  No procedures performed Allergies: Coconut (cocos nucifera), Codeine, Sulfa antibiotics, and Trazodone and  nefazodone   Assessment / Plan:     Visit Diagnoses: No diagnosis found.  ***  Orders: No orders of the defined types were placed in this encounter.  No orders of the defined types were placed in this  encounter.    Follow-Up Instructions: No follow-ups on file.   Fuller Plan, MD  Note - This record has been created using [employer].  Chart creation errors have been sought, but may not always  have been located. Such creation errors do not reflect on  the standard of medical care.

## 2022-11-30 NOTE — Telephone Encounter (Signed)
Waiting for patient to contact us back regarding which pharmacy she would like the medication sent to.

## 2022-11-30 NOTE — Telephone Encounter (Signed)
Last Fill: 08/31/2022  Eye exam: PLQ eye exam 05/20/2021 WNL   Labs: 10/03/2022 Sedimentation rate remains normal. CBC and CMP also normal no problem for continuing Rinvoq and diclofenac.   Next Visit: 01/29/2022  Last Visit: 08/31/2022  [id number] arthritis with negative rheumatoid factor, involving unspecified site   Current Dose per office note 08/31/2022: hydroxychloroquine 400 mg daily, diclofenac 25 mg twice daily.   Contacted the patient and she states she will call her eye doctor to schedule an PLQ eye exam, but the eye doctor is booked until January. Patient states she is going to see if there are any cancellations.   Okay to refill Plaquenil and Diclofenac?

## 2022-11-30 NOTE — Telephone Encounter (Signed)
Opened in error

## 2022-12-09 MED ORDER — HYDROXYCHLOROQUINE SULFATE 200 MG PO TABS
200.0000 mg | ORAL_TABLET | Freq: Two times a day (BID) | ORAL | 0 refills | Status: DC
Start: 2022-12-09 — End: 2023-02-28

## 2022-12-09 MED ORDER — DICLOFENAC SODIUM 25 MG PO TBEC
25.0000 mg | DELAYED_RELEASE_TABLET | Freq: Two times a day (BID) | ORAL | 0 refills | Status: DC | PRN
Start: 2022-12-09 — End: 2023-03-08

## 2022-12-10 ENCOUNTER — Other Ambulatory Visit (HOSPITAL_COMMUNITY): Payer: Self-pay

## 2022-12-12 ENCOUNTER — Other Ambulatory Visit (HOSPITAL_COMMUNITY): Payer: Self-pay

## 2022-12-12 ENCOUNTER — Other Ambulatory Visit: Payer: Self-pay

## 2022-12-19 ENCOUNTER — Encounter: Payer: Self-pay | Admitting: Family Medicine

## 2022-12-20 ENCOUNTER — Other Ambulatory Visit (HOSPITAL_COMMUNITY): Payer: Self-pay

## 2022-12-20 ENCOUNTER — Other Ambulatory Visit: Payer: Self-pay | Admitting: Family Medicine

## 2022-12-20 MED ORDER — OMEPRAZOLE 40 MG PO CPDR
40.0000 mg | DELAYED_RELEASE_CAPSULE | Freq: Two times a day (BID) | ORAL | 0 refills | Status: DC
  Filled 2022-12-20 – 2022-12-21 (×2): qty 180, 90d supply, fill #0

## 2022-12-21 ENCOUNTER — Other Ambulatory Visit (HOSPITAL_COMMUNITY): Payer: Self-pay

## 2022-12-21 ENCOUNTER — Other Ambulatory Visit: Payer: Self-pay

## 2022-12-21 ENCOUNTER — Ambulatory Visit: Payer: Managed Care, Other (non HMO) | Admitting: Family Medicine

## 2022-12-21 NOTE — Progress Notes (Deleted)
[facility] [address], [address] [place], [place] [address] [phone]  Date:  12/21/2022   Name:  Grace Butler   DOB:  [birth year]   MRN:  [id number]  PCP:  Pearline Cables, MD    Chief Complaint: No chief complaint on file.   History of Present Illness:  Grace Butler is a 52 y.o. very pleasant female patient who presents with the following:  Pt seen today with concern of dizziness She reached out to [place] earlier this week-  Dr Patsy Lager, over the last couple months, since the separation, I've been having these "spells" where I'm sitting, walking or driving and I have this odd feeling. Almost like vertigo but it's like a mild seizure. They come at random. Driving is when they usually happen. A friend said they sound like mini strokes and with my stress level I could see that. I don't think I'm having a stroke though. I'm not sure what it is.   Last seen by myself in March  History of RA, mood disorder, ADD, hypothyroisidm    Her partner Grace Butler is completing her cancer treatment and thankfully doing well.   Labs done September  EKG from a year ago- benign   Patient Active Problem List   Diagnosis Date Noted   Acute non-recurrent pansinusitis 03/09/2021   High risk medication use 07/29/2020   Bilateral knee pain 07/29/2020   Attention deficit hyperactivity disorder (ADHD) 09/28/2017   Insomnia, unspecified 09/28/2017   Acute meniscal tear of knee, left, subsequent encounter 02/10/2017   Left knee pain 09/01/2016   Obesity 01/14/2016   Rheumatoid arthritis (HCC) 01/14/2016   Hypothyroidism due to acquired atrophy of thyroid 01/14/2016   Osteopenia 09/15/2015   Bipolar 1 disorder (HCC) 08/21/2015   Gastroesophageal reflux disease without esophagitis 08/21/2015   Migraine 08/21/2015    Past Medical History:  Diagnosis Date   Bipolar 1 disorder (HCC)    Chronic headache    Depression    Gallstone     Hypertension    IBS (irritable bowel syndrome)    Obesity    Rheumatoid arthritis (HCC)    Thyroid disease    Ulcer     Past Surgical History:  Procedure Laterality Date   ABDOMINAL HYSTERECTOMY     APPENDECTOMY     CHOLECYSTECTOMY     COLONOSCOPY  09/07/2018   CYSTECTOMY     dermoid   ESOPHAGOGASTRODUODENOSCOPY     about 20 years ago to check for ulcers   KNEE ARTHROSCOPY Left 2019   MYOMECTOMY     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 80   UPPER GASTROINTESTINAL ENDOSCOPY      Social History   Tobacco Use   Smoking status: Former    Current packs/day: 0.00    Average packs/day: 0.5 packs/day for 20.0 years (10.0 ttl pk-yrs)    Types: Cigarettes    Start date: 01/16/1994    Quit date: 01/16/2014    Years since quitting: 8.9   Smokeless tobacco: Never  Vaping Use   Vaping status: Some Days   Substances: Nicotine  Substance Use Topics   Alcohol use: Not Currently    Comment: Rarely   Drug use: No    Family History  Problem Relation Age of Onset   Hypertension Mother    Heart attack Father    Healthy Sister    Healthy Sister    Healthy Sister    Healthy  Brother    Healthy Son    Rheum arthritis Paternal Uncle    Colon cancer Neg Hx    Esophageal cancer Neg Hx    Colon polyps Neg Hx    Rectal cancer Neg Hx    Stomach cancer Neg Hx     Allergies  Allergen Reactions   Coconut (Cocos Nucifera) Anaphylaxis   Codeine    Sulfa Antibiotics Hives   Trazodone And Nefazodone     Vivid dreams    Medication list has been reviewed and updated.  Current Outpatient Medications on File Prior to Visit  Medication Sig Dispense Refill   ALPRAZolam (XANAX) 1 MG tablet Take 1/2-1 tablet (0.5-1 mg total) by mouth 2 (two) times daily as needed for anxiety associated with flying 10 tablet 0   amitriptyline (ELAVIL) 50 MG tablet Take 1 tablet (50 mg total) by mouth every evening. 90 tablet 1   amoxicillin-clavulanate (AUGMENTIN) 875-125 MG tablet Take 1 tablet by mouth 2 (two)  times daily. 14 tablet 0   amphetamine-dextroamphetamine (ADDERALL XR) 30 MG 24 hr capsule Take 1 capsule (30 mg total) by mouth daily. 30 capsule 0   amphetamine-dextroamphetamine (ADDERALL XR) 30 MG 24 hr capsule Take 1 capsule (30 mg total) by mouth daily. 30 capsule 0   amphetamine-dextroamphetamine (ADDERALL XR) 30 MG 24 hr capsule Take 1 capsule (30 mg total) by mouth daily. 30 capsule 0   amphetamine-dextroamphetamine (ADDERALL) 10 MG tablet Take 1 tablet (10 mg total) by mouth daily with lunch. 30 tablet 0   amphetamine-dextroamphetamine (ADDERALL) 10 MG tablet Take 1 tablet (10 mg total) by mouth daily with lunch. 30 tablet 0   amphetamine-dextroamphetamine (ADDERALL) 10 MG tablet Take 1 tablet (10 mg total) by mouth daily with lunch. 30 tablet 0   ARIPiprazole (ABILIFY) 15 MG tablet Take 1 tablet (15 mg total) by mouth daily. 90 tablet 1   Ascorbic Acid (VITAMIN C) 100 MG tablet Take 100 mg by mouth daily.     baclofen (LIORESAL) 10 MG tablet Take 1 tablet (10 mg total) by mouth 3 (three) times daily. (Patient not taking: Reported on 08/31/2022) 30 each 0   butalbital-acetaminophen-caffeine (FIORICET) 50-325-40 MG tablet Take 1-2 tablets by mouth every 6 (six) hours as needed for headache. Max 6 per 24 hours 14 tablet 0   calcium citrate-vitamin D (CITRACAL+D) 315-200 MG-UNIT tablet Take 1 tablet by mouth 2 (two) times daily.     cholecalciferol (VITAMIN D) 1000 UNITS tablet Take 1,000 Units by mouth daily.     diclofenac (VOLTAREN) 25 MG EC tablet Take 1 tablet (25 mg) by mouth 2 times daily as needed. 180 tablet 0   furosemide (LASIX) 20 MG tablet Take 1 tablet (20 mg total) by mouth daily as needed for leg edema (Patient not taking: Reported on 05/16/2022) 30 tablet 3   gabapentin (NEURONTIN) 300 MG capsule Take 1 capsule (300 mg total) by mouth at bedtime. 90 capsule 1   hydroxychloroquine (PLAQUENIL) 200 MG tablet Take 1 tablet by mouth 2 times daily. 180 tablet 0   levothyroxine  (SYNTHROID) 175 MCG tablet Take 1 tablet (175 mcg total) by mouth daily before breakfast. 90 tablet 3   lisinopril (ZESTRIL) 10 MG tablet Take 1 tablet (10 mg total) by mouth daily. 90 tablet 0   omeprazole (PRILOSEC) 40 MG capsule Take 1 capsule (40 mg total) by mouth in the morning and at bedtime. 180 capsule 0   predniSONE (DELTASONE) 5 MG tablet Take 4 tablets by mouth  daily x3 days, 3 tablets daily x3 days, 2 tablets daily x3 days, 1 tablet daily x3 days. 30 tablet 0   QUEtiapine (SEROQUEL) 50 MG tablet Take 3 tablets (150 mg total) by mouth at bedtime. 21 tablet 0   RINVOQ 15 MG TB24 TAKE 1 TABLET DAILY 30 tablet 0   rizatriptan (MAXALT-MLT) 10 MG disintegrating tablet Take 1 tablet (10 mg total) by mouth as needed for migraine. May repeat in 2 hours if needed 10 tablet 3   Specialty Vitamins Products (MAGNESIUM, AMINO ACID CHELATE,) 133 MG tablet Take 1 tablet by mouth 2 (two) times daily.     tirzepatide (ZEPBOUND) 2.5 MG/0.5ML Pen Inject 2.5 mg into the skin once a week. 2 mL 2   valACYclovir (VALTREX) 1000 MG tablet Take 1 tablet (1,000 mg total) by mouth daily. Take for 5 days for outbreak as needed 30 tablet 1   Zoster Vaccine Adjuvanted [facility]) injection Inject into the muscle. (Patient not taking: Reported on 05/16/2022) 1 mL 0   No current facility-administered medications on file prior to visit.    Review of Systems:  As per HPI- otherwise negative.   Physical Examination: There were no vitals filed for this visit. There were no vitals filed for this visit. There is no height or weight on file to calculate BMI. Ideal Body Weight:    GEN: no acute distress. HEENT: Atraumatic, Normocephalic.  Ears and Nose: No external deformity. CV: RRR, No M/G/R. No JVD. No thrill. No extra heart sounds. PULM: CTA B, no wheezes, crackles, rhonchi. No retractions. No resp. distress. No accessory muscle use. ABD: S, NT, ND, +BS. No rebound. No HSM. EXTR: No c/c/e PSYCH: Normally  interactive. Conversant.    Assessment and Plan: ***  Signed Abbe Amsterdam, MD

## 2022-12-26 ENCOUNTER — Other Ambulatory Visit: Payer: Self-pay | Admitting: Internal Medicine

## 2022-12-26 ENCOUNTER — Other Ambulatory Visit (HOSPITAL_COMMUNITY): Payer: Self-pay

## 2022-12-26 DIAGNOSIS — M0600 Rheumatoid arthritis without rheumatoid factor, unspecified site: Secondary | ICD-10-CM

## 2022-12-26 NOTE — Telephone Encounter (Signed)
Last Fill: 11/28/2022 (30 day supply)  Labs: 10/03/2022 Sedimentation rate remains normal. CBC and CMP also normal no problem for continuing Rinvoq and diclofenac.   TB Gold: 05/16/2022 Neg    Next Visit: 01/30/2023  Last Visit: 08/31/2022  QM:VHQIONGEXB arthritis with negative rheumatoid factor, involving unspecified site   Current Dose per office note 08/31/2022: Rinvoq 15 mg daily   Okay to refill Rinvoq?

## 2023-01-05 ENCOUNTER — Other Ambulatory Visit: Payer: Self-pay | Admitting: Physician Assistant

## 2023-01-05 NOTE — Telephone Encounter (Signed)
 DUE 1/6 LF 12/9

## 2023-01-09 ENCOUNTER — Other Ambulatory Visit: Payer: Self-pay | Admitting: Physician Assistant

## 2023-01-09 ENCOUNTER — Other Ambulatory Visit (HOSPITAL_COMMUNITY): Payer: Self-pay

## 2023-01-09 MED ORDER — AMPHETAMINE-DEXTROAMPHET ER 30 MG PO CP24
30.0000 mg | ORAL_CAPSULE | Freq: Every day | ORAL | 0 refills | Status: DC
  Filled 2023-01-10: qty 30, 30d supply, fill #0

## 2023-01-09 NOTE — Telephone Encounter (Signed)
 Lf 12/9

## 2023-01-10 ENCOUNTER — Other Ambulatory Visit (HOSPITAL_COMMUNITY): Payer: Self-pay

## 2023-01-10 ENCOUNTER — Telehealth: Payer: Self-pay

## 2023-01-10 MED ORDER — RIZATRIPTAN BENZOATE 10 MG PO TBDP: 10.0000 mg | ORAL_TABLET | ORAL | 0 refills | Status: AC | PRN

## 2023-01-10 NOTE — Addendum Note (Signed)
 Addended by: Abbe Amsterdam C on: 01/10/2023 01:52 PM   Modules accepted: Orders

## 2023-01-10 NOTE — Telephone Encounter (Signed)
 Received a refill request for Maxalt. Pt was last seen 02/2022- unable to tell when she was supposed to f/u. No appts are pending. Okay for refill?

## 2023-01-17 NOTE — Progress Notes (Deleted)
 Office Visit Note  Patient: Grace Butler             Date of Birth: 17-Jan-1970           MRN: [id number]             PCP: Pearline Cables, MD Referring: Pearline Cables, MD Visit Date: 01/30/2023   Subjective:  No chief complaint on file.   History of Present Illness: Grace Butler is a 53 y.o. female here for follow up for seropositive RA on Rinvoq 15 mg daily, hydroxychloroquine 400 mg daily, and diclofenac 25 mg twice daily as needed.    Previous HPI 08/31/2022 Grace Butler is a 53 y.o. female here for follow up for seropositive RA on Rinvoq 15 mg daily, hydroxychloroquine 400 mg daily, and diclofenac 25 mg twice daily as needed.  Overall hip symptoms have been well-controlled without any major exacerbation and no significant illness.  Earlier this month she did experience an increase in low back pain also a popping sensation of the right shoulder with some pain mostly just from certain arm movements.  This was all during repetitive lifting activities associate with a move.  She took muscle relaxer and 5-day course of prednisone 20 mg symptoms are improved.     Previous HPI 05/16/2022 Grace Butler is a 53 y.o. female here for follow up for seropositive RA on Rinvoq 15 mg daily hydroxychloroquine 400 mg daily and diclofenac 25 mg twice daily as needed.  Somewhat prolonged time since her last follow-up as she has had many major life events going on including change of work and marriage separation.  Without significant flareup over the winter or throughout this time.  Has not had any serious interval infections or complications.  No new falls or injuries.   Previous HPI 08/30/21 Grace Butler is a 53 y.o. female here for follow up for seronegative RA on Rinvoq 15 mg daily hydroxychloroquine 400 mg daily and diclofenac 25 mg as needed.  She has not had any major flareups or exacerbations since her last visit or required any steroid treatment.  No new infections or antibiotic  treatment required.  This morning at work she fell describes as tripping over her own foot and landed incurring some abrasion on her right elbow as well as lateral right knee and right hip.   Previous HPI 05/26/2021 Grace Butler is a 53 y.o. female here for follow up  for seronegative RA on Rinvoq 15mg  po qd, HCQ 400 mg PO daily, and diclofenac 25 mg PRN. She is doing well overall since last visit no major flare ups. No infections or antibiotic treatments needed. She does not have many bad days recently. She had lab testing drawn on 05/06/21 with her family medicine office including CBC, CMP, and TSH which were normal. Her wife was newly diagnosed with colorectal cancer and undergoing neoadjuvant chemoradiation therapy so she has increased stress and missed work time due to that.   Previous HPI 01/19/2021 Grace Butler is a 53 y.o. female here for follow up for seronegative RA on Enbrel 50 mg [place] weekly, HCQ 400 mg PO daily, and diclofenac 25 mg PRN. She has been doing well most of the time without a RA flare up but for the last few weeks having increased stiffness in bilateral legs, also pain but less of a problem than the stiffness. She fell twice without major injury. She has an appointment to start working with a personal  trainer on Thursday plans to do resistance training for strengthening and ideally some weight loss. She is interested in information about Rinvoq after hearing about the medication.   Previous HPI 10/20/20 Grace Butler is a 53 y.o. female here for follow up for seronegative rheumatoid arthritis after restarting treatment with Enbrel 50 mg [place] weekly with continued HCQ 400 mg PO daily. Workup at that visit with negative inflammatory serology, xray of left knee does show osteoarthritis changes. She had been feeling pretty well after starting Enbrel although continuing to take diclofenac 25 mg BID regularly. However in the past few weeks she is feeling worse, with several major life and  family stressors going on. 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 Her joint pain is worse especially knees and ankles. Not especially stiff though. Recent fall onto right elbow and right knee without major injury. Had right knee injection with orthopedic surgery for pain and swelling there, with an improvement.   Previous HPI 07/29/20 Grace Butler is a 53 y.o. female here for transition of care for seronegative rheumatoid arthritis previously managed with [facility] rheumatology.  Rheumatoid arthritis was diagnosed in 2013 with development of bilateral hand and feet swelling and morning stiffness.  Previous treatments include methotrexate stopped due to elevated transaminases.  She was treated with Humira for about 6 months but with increasing injection site reaction and pain did not tolerate this well.  She was switched to Enbrel continued this for several months but also stopped due to the same problem of local injection site reactions and pain.  She is not sure whether the medicine was ever effective due to early discontinuation.  She started [place] in 2016 she felt greatly improved her symptoms continue this medicine until 2021 she does question whether there was loss of efficacy.  After Harriette Ohara she started hydroxychloroquine has been on this since October 2021 does not feel this medication has been highly effective for her joint pain.  Currently she complains of daily pain worst in her bilateral knees and feet.  She describes the pain as sharp but states her foot pain feels more like a pressure sensation.  The knee pain is exacerbated climbing stairs.  She notices stiffness pain and redness over her MCP joints does not describe discrete swelling in this area lately.  She is not sure whether there is knee or ankle swelling related to  her arthritis body habitus and swelling and redness she gets in the feet from standing during the day.  Currently she takes Celebrex once daily in the mornings and takes Tylenol usually twice a day for her ongoing joint pain.  She previously took oral diclofenac that she found helpful but was switched due to concern of long-term NSAID exposure.  She takes hydroxychloroquine 400 mg/day.   DMARD Hx MTX d/c LFTs Humira d/c injection site reactions Enbrel d/c injection site reactions (old formulation) Harriette Ohara ?loss of efficacy   Labs  02/2014 HBV/HCV neg RF neg CCP neg   No Rheumatology ROS completed.   PMFS History:  Patient Active Problem List   Diagnosis Date Noted   Acute non-recurrent pansinusitis 03/09/2021   High risk medication use 07/29/2020   Bilateral knee pain 07/29/2020   Attention deficit hyperactivity disorder (ADHD) 09/28/2017   Insomnia, unspecified 09/28/2017   Acute meniscal tear of knee, left,  subsequent encounter 02/10/2017   Left knee pain 09/01/2016   Obesity 01/14/2016   Rheumatoid arthritis (HCC) 01/14/2016   Hypothyroidism due to acquired atrophy of thyroid 01/14/2016   Osteopenia 09/15/2015   Bipolar 1 disorder (HCC) 08/21/2015   Gastroesophageal reflux disease without esophagitis 08/21/2015   Migraine 08/21/2015    Past Medical History:  Diagnosis Date   Bipolar 1 disorder (HCC)    Chronic headache    Depression    Gallstone    Hypertension    IBS (irritable bowel syndrome)    Obesity    Rheumatoid arthritis (HCC)    Thyroid disease    Ulcer     Family History  Problem Relation Age of Onset   Hypertension Mother    Heart attack Father    Healthy Sister    Healthy Sister    Healthy Sister    Healthy Brother    Healthy Son    Rheum arthritis Paternal Uncle    Colon cancer Neg Hx    Esophageal cancer Neg Hx    Colon polyps Neg Hx    Rectal cancer Neg Hx    Stomach cancer Neg Hx    Past Surgical History:  Procedure Laterality Date    ABDOMINAL HYSTERECTOMY     APPENDECTOMY     CHOLECYSTECTOMY     COLONOSCOPY  09/07/2018   CYSTECTOMY     dermoid   ESOPHAGOGASTRODUODENOSCOPY     about 20 years ago to check for ulcers   KNEE ARTHROSCOPY Left 2019   MYOMECTOMY     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 35   UPPER GASTROINTESTINAL ENDOSCOPY     Social History   Social History Narrative   Not on file   Immunization History  Administered Date(s) Administered   Influenza,inj,Quad PF,6+ Mos 01/14/2016, 09/13/2017   Influenza-Unspecified 01/14/2016, 10/31/2016, 09/13/2017, 12/17/2017, 08/06/2018   Pneumococcal Conjugate-13 03/24/2014   Pneumococcal Polysaccharide-23 05/26/2014   Pneumococcal-Unspecified 05/26/2014   Tdap 11/10/2017   Zoster Recombinant(Shingrix) 01/31/2022     Objective: Vital Signs: There were no vitals taken for this visit.   Physical Exam   Musculoskeletal Exam: ***  CDAI Exam: CDAI Score: -- Patient Global: --; Provider Global: -- Swollen: --; Tender: -- Joint Exam 01/30/2023   No joint exam has been documented for this visit   There is currently no information documented on the homunculus. Go to the Rheumatology activity and complete the homunculus joint exam.  Investigation: No additional findings.  Imaging: No results found.  Recent Labs: Lab Results  Component Value Date   WBC 4.2 10/03/2022   HGB 13.1 10/03/2022   PLT 312 10/03/2022   NA 139 10/03/2022   K 4.6 10/03/2022   CL 103 10/03/2022   CO2 26 10/03/2022   GLUCOSE 89 10/03/2022   BUN 8 10/03/2022   CREATININE 0.80 10/03/2022   BILITOT 0.5 10/03/2022   ALKPHOS 99 01/25/2021   AST 29 10/03/2022   ALT 27 10/03/2022   PROT 7.3 10/03/2022   ALBUMIN 4.2 01/25/2021   CALCIUM 9.6 10/03/2022   GFRAA >60 04/23/2019   QFTBGOLDPLUS NEGATIVE 05/16/2022    Speciality Comments: PLQ eye exam 05/20/2021 [facility] [place] f/u 12 months. Plan to schedule PLQ eye exam in January.  Patient states she had a regular eye  exam at [facility]  Procedures:  No procedures performed Allergies: Coconut (cocos nucifera), Codeine, Sulfa antibiotics, and Trazodone and nefazodone   Assessment / Plan:     Visit Diagnoses: No diagnosis  found.  ***  Orders: No orders of the defined types were placed in this encounter.  No orders of the defined types were placed in this encounter.    Follow-Up Instructions: No follow-ups on file.   Metta Clines, RT  Note - This record has been created using [employer].  Chart creation errors have been sought, but may not always  have been located. Such creation errors do not reflect on  the standard of medical care.

## 2023-01-30 ENCOUNTER — Ambulatory Visit: Payer: Managed Care, Other (non HMO) | Admitting: Internal Medicine

## 2023-01-30 DIAGNOSIS — M0600 Rheumatoid arthritis without rheumatoid factor, unspecified site: Secondary | ICD-10-CM

## 2023-01-30 DIAGNOSIS — Z79899 Other long term (current) drug therapy: Secondary | ICD-10-CM

## 2023-02-04 ENCOUNTER — Other Ambulatory Visit: Payer: Self-pay | Admitting: Physician Assistant

## 2023-02-06 ENCOUNTER — Other Ambulatory Visit (HOSPITAL_COMMUNITY): Payer: Self-pay

## 2023-02-06 MED ORDER — AMPHETAMINE-DEXTROAMPHET ER 30 MG PO CP24
30.0000 mg | ORAL_CAPSULE | Freq: Every day | ORAL | 0 refills | Status: DC
  Filled 2023-02-10: qty 30, 30d supply, fill #0

## 2023-02-06 NOTE — Telephone Encounter (Signed)
Lf 1/7  Changed the start date to tomorrow 2/4

## 2023-02-10 ENCOUNTER — Other Ambulatory Visit (HOSPITAL_COMMUNITY): Payer: Self-pay

## 2023-02-16 ENCOUNTER — Other Ambulatory Visit: Payer: Self-pay | Admitting: Family Medicine

## 2023-02-16 DIAGNOSIS — I10 Essential (primary) hypertension: Secondary | ICD-10-CM

## 2023-02-25 ENCOUNTER — Telehealth: Payer: Managed Care, Other (non HMO) | Admitting: Family Medicine

## 2023-02-25 ENCOUNTER — Telehealth: Payer: Managed Care, Other (non HMO)

## 2023-02-25 DIAGNOSIS — B9689 Other specified bacterial agents as the cause of diseases classified elsewhere: Secondary | ICD-10-CM

## 2023-02-25 DIAGNOSIS — J019 Acute sinusitis, unspecified: Secondary | ICD-10-CM

## 2023-02-25 MED ORDER — AMOXICILLIN-POT CLAVULANATE 875-125 MG PO TABS: 1.0000 | ORAL_TABLET | Freq: Two times a day (BID) | ORAL | 0 refills | Status: DC

## 2023-02-25 MED ORDER — PREDNISONE 5 MG PO TABS: ORAL_TABLET | ORAL | 0 refills | Status: DC

## 2023-02-25 NOTE — Progress Notes (Signed)
 Virtual Visit Consent   Grace Butler, you are scheduled for a virtual visit with a [facility] provider today. Just as with appointments in the office, your consent must be obtained to participate. Your consent will be active for this visit and any virtual visit you may have with one of our providers in the next 365 days. If you have a MyChart account, a copy of this consent can be sent to you electronically.  As this is a virtual visit, video technology does not allow for your provider to perform a traditional examination. This may limit your provider's ability to fully assess your condition. If your provider identifies any concerns that need to be evaluated in person or the need to arrange testing (such as labs, EKG, etc.), we will make arrangements to do so. Although advances in technology are sophisticated, we cannot ensure that it will always work on either your end or our end. If the connection with a video visit is poor, the visit may have to be switched to a telephone visit. With either a video or telephone visit, we are not always able to ensure that we have a secure connection.  By engaging in this virtual visit, you consent to the provision of healthcare and authorize for your insurance to be billed (if applicable) for the services provided during this visit. Depending on your insurance coverage, you may receive a charge related to this service.  I need to obtain your verbal consent now. Are you willing to proceed with your visit today? Grace Butler has provided verbal consent on 02/25/2023 for a virtual visit (video or telephone). Georgana Curio, FNP  Date: 02/25/2023 2:31 PM   Virtual Visit via Video Note   I, Georgana Curio, connected with  Grace Butler  ([id number], Mar 01, 1970) on 02/25/23 at  2:30 PM EST by a video-enabled telemedicine application and verified that I am speaking with the correct person using two identifiers.  Location: Patient: Virtual Visit Location Patient:  Home Provider: Virtual Visit Location Provider: Home Office   I discussed the limitations of evaluation and management by telemedicine and the availability of in person appointments. The patient expressed understanding and agreed to proceed.    History of Present Illness: Grace Butler is a 53 y.o. who identifies as a female who was assigned female at birth, and is being seen today for sinus pain and pressure with post nasal drainage for 4-5 days worsening, green mucus, no fever, no cough. .  HPI: HPI  Problems:  Patient Active Problem List   Diagnosis Date Noted   Acute non-recurrent pansinusitis 03/09/2021   High risk medication use 07/29/2020   Bilateral knee pain 07/29/2020   Attention deficit hyperactivity disorder (ADHD) 09/28/2017   Insomnia, unspecified 09/28/2017   Acute meniscal tear of knee, left, subsequent encounter 02/10/2017   Left knee pain 09/01/2016   Obesity 01/14/2016   Rheumatoid arthritis (HCC) 01/14/2016   Hypothyroidism due to acquired atrophy of thyroid 01/14/2016   Osteopenia 09/15/2015   Bipolar 1 disorder (HCC) 08/21/2015   Gastroesophageal reflux disease without esophagitis 08/21/2015   Migraine 08/21/2015    Allergies:  Allergies  Allergen Reactions   Coconut (Cocos Nucifera) Anaphylaxis   Codeine    Sulfa Antibiotics Hives   Trazodone And Nefazodone     Vivid dreams   Medications:  Current Outpatient Medications:    ALPRAZolam (XANAX) 1 MG tablet, Take 1/2-1 tablet (0.5-1 mg total) by mouth 2 (two) times daily as needed for anxiety associated  with flying, Disp: 10 tablet, Rfl: 0   amitriptyline (ELAVIL) 50 MG tablet, Take 1 tablet (50 mg total) by mouth every evening., Disp: 90 tablet, Rfl: 1   amoxicillin-clavulanate (AUGMENTIN) 875-125 MG tablet, Take 1 tablet by mouth 2 (two) times daily., Disp: 14 tablet, Rfl: 0   amoxicillin-clavulanate (AUGMENTIN) 875-125 MG tablet, Take 1 tablet by mouth 2 (two) times daily., Disp: 20 tablet, Rfl: 0    amphetamine-dextroamphetamine (ADDERALL XR) 30 MG 24 hr capsule, Take 1 capsule (30 mg total) by mouth daily., Disp: 30 capsule, Rfl: 0   amphetamine-dextroamphetamine (ADDERALL XR) 30 MG 24 hr capsule, Take 1 capsule (30 mg total) by mouth daily., Disp: 30 capsule, Rfl: 0   amphetamine-dextroamphetamine (ADDERALL XR) 30 MG 24 hr capsule, Take 1 capsule (30 mg total) by mouth daily., Disp: 30 capsule, Rfl: 0   amphetamine-dextroamphetamine (ADDERALL) 10 MG tablet, Take 1 tablet (10 mg total) by mouth daily with lunch., Disp: 30 tablet, Rfl: 0   amphetamine-dextroamphetamine (ADDERALL) 10 MG tablet, Take 1 tablet (10 mg total) by mouth daily with lunch., Disp: 30 tablet, Rfl: 0   amphetamine-dextroamphetamine (ADDERALL) 10 MG tablet, Take 1 tablet (10 mg total) by mouth daily with lunch., Disp: 30 tablet, Rfl: 0   ARIPiprazole (ABILIFY) 15 MG tablet, Take 1 tablet (15 mg total) by mouth daily., Disp: 90 tablet, Rfl: 1   Ascorbic Acid (VITAMIN C) 100 MG tablet, Take 100 mg by mouth daily., Disp: , Rfl:    baclofen (LIORESAL) 10 MG tablet, Take 1 tablet (10 mg total) by mouth 3 (three) times daily. (Patient not taking: Reported on 08/31/2022), Disp: 30 each, Rfl: 0   butalbital-acetaminophen-caffeine (FIORICET) 50-325-40 MG tablet, Take 1-2 tablets by mouth every 6 (six) hours as needed for headache. Max 6 per 24 hours, Disp: 14 tablet, Rfl: 0   calcium citrate-vitamin D (CITRACAL+D) 315-200 MG-UNIT tablet, Take 1 tablet by mouth 2 (two) times daily., Disp: , Rfl:    cholecalciferol (VITAMIN D) 1000 UNITS tablet, Take 1,000 Units by mouth daily., Disp: , Rfl:    diclofenac (VOLTAREN) 25 MG EC tablet, Take 1 tablet (25 mg) by mouth 2 times daily as needed., Disp: 180 tablet, Rfl: 0   furosemide (LASIX) 20 MG tablet, Take 1 tablet (20 mg total) by mouth daily as needed for leg edema (Patient not taking: Reported on 05/16/2022), Disp: 30 tablet, Rfl: 3   gabapentin (NEURONTIN) 300 MG capsule, Take 1 capsule  (300 mg total) by mouth at bedtime., Disp: 90 capsule, Rfl: 1   hydroxychloroquine (PLAQUENIL) 200 MG tablet, Take 1 tablet by mouth 2 times daily., Disp: 180 tablet, Rfl: 0   levothyroxine (SYNTHROID) 175 MCG tablet, Take 1 tablet (175 mcg total) by mouth daily before breakfast., Disp: 90 tablet, Rfl: 3   lisinopril (ZESTRIL) 10 MG tablet, Take 1 tablet (10 mg total) by mouth daily., Disp: 90 tablet, Rfl: 3   omeprazole (PRILOSEC) 40 MG capsule, Take 1 capsule (40 mg total) by mouth in the morning and at bedtime., Disp: 180 capsule, Rfl: 0   predniSONE (DELTASONE) 5 MG tablet, Take 4 tablets by mouth daily x3 days, 3 tablets daily x3 days, 2 tablets daily x3 days, 1 tablet daily x3 days., Disp: 30 tablet, Rfl: 0   QUEtiapine (SEROQUEL) 50 MG tablet, TAKE 3 TABLETS AT BEDTIME, Disp: 270 tablet, Rfl: 0   RINVOQ 15 MG TB24, TAKE 1 TABLET DAILY, Disp: 30 tablet, Rfl: 1   rizatriptan (MAXALT-MLT) 10 MG disintegrating tablet, Take 1  tablet (10 mg total) by mouth as needed for migraine. May repeat in 2 hours if needed.  Office visit is needed please, Disp: 10 tablet, Rfl: 0   Specialty Vitamins Products (MAGNESIUM, AMINO ACID CHELATE,) 133 MG tablet, Take 1 tablet by mouth 2 (two) times daily., Disp: , Rfl:    tirzepatide (ZEPBOUND) 2.5 MG/0.5ML Pen, Inject 2.5 mg into the skin once a week., Disp: 2 mL, Rfl: 2   valACYclovir (VALTREX) 1000 MG tablet, Take 1 tablet (1,000 mg total) by mouth daily. Take for 5 days for outbreak as needed, Disp: 30 tablet, Rfl: 1   Zoster Vaccine Adjuvanted [facility]) injection, Inject into the muscle. (Patient not taking: Reported on 05/16/2022), Disp: 1 mL, Rfl: 0  Observations/Objective: Patient is well-developed, well-nourished in no acute distress.  Resting comfortably  at home.  Head is normocephalic, atraumatic.  No labored breathing.  Speech is clear and coherent with logical content.  Patient is alert and oriented at baseline.   Assessment and Plan: 1. Acute  bacterial sinusitis  Increase fluids, humidifier at night, tylenol or ibuprofen as directed, UC if sx worsen.   Follow Up Instructions: I discussed the assessment and treatment plan with the patient. The patient was provided an opportunity to ask questions and all were answered. The patient agreed with the plan and demonstrated an understanding of the instructions.  A copy of instructions were sent to the patient via MyChart unless otherwise noted below.     The patient was advised to call back or seek an in-person evaluation if the symptoms worsen or if the condition fails to improve as anticipated.    Georgana Curio, FNP

## 2023-02-25 NOTE — Patient Instructions (Signed)

## 2023-02-28 ENCOUNTER — Telehealth: Payer: Self-pay | Admitting: Internal Medicine

## 2023-02-28 ENCOUNTER — Other Ambulatory Visit: Payer: Self-pay | Admitting: Internal Medicine

## 2023-02-28 ENCOUNTER — Other Ambulatory Visit: Payer: Self-pay | Admitting: *Deleted

## 2023-02-28 DIAGNOSIS — Z79899 Other long term (current) drug therapy: Secondary | ICD-10-CM

## 2023-02-28 DIAGNOSIS — M0600 Rheumatoid arthritis without rheumatoid factor, unspecified site: Secondary | ICD-10-CM

## 2023-02-28 NOTE — Telephone Encounter (Signed)
I called patient, referral placed 

## 2023-02-28 NOTE — Telephone Encounter (Signed)
 Contacted the patient back and patient states she is calling [facility] to schedule an appointment there and will call us back with the appointment details.

## 2023-02-28 NOTE — Telephone Encounter (Signed)
 Patient left a message stating she was returning a call regarding her Plaquenil eye exam.  Patient states she plans to have the exam next week.

## 2023-02-28 NOTE — Telephone Encounter (Signed)
 Last Fill: 12/09/2022  Eye exam: 05/20/2021 WNL   Labs: 10/03/2022 Sedimentation rate remains normal. CBC and CMP also normal no problem for continuing Rinvoq and diclofenac.   Next Visit: 03/28/2023  Last Visit: 08/31/2022  [id number] arthritis with negative rheumatoid factor, involving unspecified site [facility])   Current Dose per office note 08/31/2022: hydroxychloroquine 400 mg daily  Attempted to contact the patient and left a message advising [facility] eye exam is due and advised the patient to call the office back.   Okay to refill Plaquenil?

## 2023-02-28 NOTE — Telephone Encounter (Signed)
 Pt called asking if we could get her a referral to [facility] for her plaquenil eye exam. Pt states they wont see her till November without a referral.

## 2023-03-06 ENCOUNTER — Other Ambulatory Visit: Payer: Self-pay | Admitting: Physician Assistant

## 2023-03-07 LAB — HM MAMMOGRAPHY

## 2023-03-07 NOTE — Telephone Encounter (Signed)
 LF 2/7, due 3/7

## 2023-03-08 ENCOUNTER — Other Ambulatory Visit: Payer: Self-pay | Admitting: Internal Medicine

## 2023-03-08 DIAGNOSIS — Z79899 Other long term (current) drug therapy: Secondary | ICD-10-CM

## 2023-03-08 DIAGNOSIS — M0600 Rheumatoid arthritis without rheumatoid factor, unspecified site: Secondary | ICD-10-CM

## 2023-03-08 NOTE — Telephone Encounter (Signed)
 Last Fill: 12/09/2022  Labs: 10/03/2022 Sedimentation rate remains normal. CBC and CMP also normal no problem for continuing Rinvoq and diclofenac.   Next Visit: 03/28/2023  Last Visit: 08/31/2022  DX: Rheumatoid arthritis with negative rheumatoid factor, involving unspecified site [facility])   Current Dose per office note 08/31/2022: diclofenac 25 mg twice daily   Patient to update labs at upcomming appointment.   Okay to refill Diclofenac?

## 2023-03-10 ENCOUNTER — Other Ambulatory Visit (HOSPITAL_COMMUNITY): Payer: Self-pay

## 2023-03-10 MED ORDER — AMPHETAMINE-DEXTROAMPHET ER 30 MG PO CP24
30.0000 mg | ORAL_CAPSULE | Freq: Every day | ORAL | 0 refills | Status: DC
  Filled 2023-03-13: qty 30, 30d supply, fill #0

## 2023-03-11 ENCOUNTER — Other Ambulatory Visit (HOSPITAL_COMMUNITY): Payer: Self-pay

## 2023-03-11 MED FILL — Diclofenac Sodium Tab Delayed Release 25 MG: ORAL | 30 days supply | Qty: 60 | Fill #0 | Status: AC

## 2023-03-13 ENCOUNTER — Other Ambulatory Visit (HOSPITAL_COMMUNITY): Payer: Self-pay

## 2023-03-14 ENCOUNTER — Other Ambulatory Visit (HOSPITAL_COMMUNITY): Payer: Self-pay

## 2023-03-14 NOTE — Progress Notes (Signed)
 Office Visit Note  Patient: Grace Butler             Date of Birth: September 23, 1970           MRN: [id number]             PCP: Pearline Cables, MD Referring: Pearline Cables, MD Visit Date: 03/28/2023   Subjective:  Follow-up   Discussed the use of AI scribe software for clinical note transcription with the patient, who gave verbal consent to proceed.  History of Present Illness   Grace Butler is a 53 y.o. female here for follow up for seropositive RA on Rinvoq 15 mg daily, hydroxychloroquine 400 mg daily, and diclofenac 25 mg twice daily as needed.    She is currently taking Rinvoq, Plaquenil, and diclofenac for arthritis. She has not experienced any major flare-ups recently and describes her condition as 'pretty good' despite personal stressors. Morning stiffness lasts about five to ten minutes and is 'noticeable but not super long.'  Menopausal symptoms, including flushing, sweating, and night sweats, have improved with hormone therapy. She is relieved that these symptoms are 'gone' and appreciates a 'great night's sleep.'  She experiences fluid retention in her ankles, particularly after prolonged sitting. Pain occurs during leg massages, described as 'hurts' when touched, and is associated with her varicose veins. The pain is localized to her calves and shins. No significant swelling or fluid retention beyond what is described.  She had a sinus infection about a month ago, treated with a steroid taper and Augmentin, which resolved the infection. She has not received a flu shot this season but has otherwise avoided illness.       Previous HPI 08/31/2022 Grace Butler is a 53 y.o. female here for follow up for seropositive RA on Rinvoq 15 mg daily, hydroxychloroquine 400 mg daily, and diclofenac 25 mg twice daily as needed.  Overall hip symptoms have been well-controlled without any major exacerbation and no significant illness.  Earlier this month she did experience an  increase in low back pain also a popping sensation of the right shoulder with some pain mostly just from certain arm movements.  This was all during repetitive lifting activities associate with a move.  She took muscle relaxer and 5-day course of prednisone 20 mg symptoms are improved.     Previous HPI 05/16/2022 Grace Butler is a 53 y.o. female here for follow up for seropositive RA on Rinvoq 15 mg daily hydroxychloroquine 400 mg daily and diclofenac 25 mg twice daily as needed.  Somewhat prolonged time since her last follow-up as she has had many major life events going on including change of work and marriage separation.  Without significant flareup over the winter or throughout this time.  Has not had any serious interval infections or complications.  No new falls or injuries.   Previous HPI 08/30/21 Grace Butler is a 53 y.o. female here for follow up for seronegative RA on Rinvoq 15 mg daily hydroxychloroquine 400 mg daily and diclofenac 25 mg as needed.  She has not had any major flareups or exacerbations since her last visit or required any steroid treatment.  No new infections or antibiotic treatment required.  This morning at work she fell describes as tripping over her own foot and landed incurring some abrasion on her right elbow as well as lateral right knee and right hip.   Previous HPI 05/26/2021 Grace Butler is a 53 y.o. female here for follow  up  for seronegative RA on Rinvoq 15mg  po qd, HCQ 400 mg PO daily, and diclofenac 25 mg PRN. She is doing well overall since last visit no major flare ups. No infections or antibiotic treatments needed. She does not have many bad days recently. She had lab testing drawn on 05/06/21 with her family medicine office including CBC, CMP, and TSH which were normal. Her wife was newly diagnosed with colorectal cancer and undergoing neoadjuvant chemoradiation therapy so she has increased stress and missed work time due to that.   Previous  HPI 01/19/2021 Grace Butler is a 53 y.o. female here for follow up for seronegative RA on Enbrel 50 mg [place] weekly, HCQ 400 mg PO daily, and diclofenac 25 mg PRN. She has been doing well most of the time without a RA flare up but for the last few weeks having increased stiffness in bilateral legs, also pain but less of a problem than the stiffness. She fell twice without major injury. She has an appointment to start working with a Systems analyst on Thursday plans to do resistance training for strengthening and ideally some weight loss. She is interested in information about Rinvoq after hearing about the medication.   Previous HPI 10/20/20 Grace Butler is a 53 y.o. female here for follow up for seronegative rheumatoid arthritis after restarting treatment with Enbrel 50 mg [place] weekly with continued HCQ 400 mg PO daily. Workup at that visit with negative inflammatory serology, xray of left knee does show osteoarthritis changes. She had been feeling pretty well after starting Enbrel although continuing to take diclofenac 25 mg BID regularly. However in the past few weeks she is feeling worse, with several major life and family stressors going on. 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 Her joint pain is worse especially knees and ankles. Not especially stiff though. Recent fall onto right elbow and right knee without major injury. Had right knee injection with orthopedic surgery for pain and swelling there, with an improvement.   Previous HPI 07/29/20 Grace Butler is a 53 y.o. female here for transition of care for seronegative rheumatoid arthritis previously managed with [facility] rheumatology.  Rheumatoid arthritis was diagnosed in 2013 with development of bilateral hand and feet swelling and morning stiffness.  Previous treatments  include methotrexate stopped due to elevated transaminases.  She was treated with Humira for about 6 months but with increasing injection site reaction and pain did not tolerate this well.  She was switched to Enbrel continued this for several months but also stopped due to the same problem of local injection site reactions and pain.  She is not sure whether the medicine was ever effective due to early discontinuation.  She started [place] in 2016 she felt greatly improved her symptoms continue this medicine until 2021 she does question whether there was loss of efficacy.  After Harriette Ohara she started hydroxychloroquine has been on this since October 2021 does not feel this medication has been highly effective for her joint pain.  Currently she complains of daily pain worst in her bilateral knees and feet.  She describes the pain as sharp but states her foot pain feels more like a pressure sensation.  The knee pain is exacerbated climbing stairs.  She notices stiffness pain and redness over her MCP joints does not describe discrete  swelling in this area lately.  She is not sure whether there is knee or ankle swelling related to her arthritis body habitus and swelling and redness she gets in the feet from standing during the day.  Currently she takes Celebrex once daily in the mornings and takes Tylenol usually twice a day for her ongoing joint pain.  She previously took oral diclofenac that she found helpful but was switched due to concern of long-term NSAID exposure.  She takes hydroxychloroquine 400 mg/day.   DMARD Hx MTX d/c LFTs Humira d/c injection site reactions Enbrel d/c injection site reactions (old formulation) Harriette Ohara ?loss of efficacy   Labs  02/2014 HBV/HCV neg RF neg CCP neg   Review of Systems  Constitutional:  Positive for fatigue.  HENT:  Negative for mouth sores and mouth dryness.   Eyes:  Negative for dryness.  Respiratory:  Negative for shortness of breath.   Cardiovascular:   Negative for chest pain and palpitations.  Gastrointestinal:  Negative for blood in stool, constipation and diarrhea.  Endocrine: Negative for increased urination.  Genitourinary:  Negative for involuntary urination.  Musculoskeletal:  Positive for joint pain, joint pain, joint swelling, morning stiffness and muscle tenderness. Negative for gait problem, myalgias, muscle weakness and myalgias.  Skin:  Positive for sensitivity to sunlight. Negative for color change, rash and hair loss.  Allergic/Immunologic: Negative for susceptible to infections.  Neurological:  Negative for dizziness and headaches.  Hematological:  Negative for swollen glands.  Psychiatric/Behavioral:  Negative for depressed mood and sleep disturbance. The patient is not nervous/anxious.     PMFS History:  Patient Active Problem List   Diagnosis Date Noted   Acute non-recurrent pansinusitis 03/09/2021   High risk medication use 07/29/2020   Bilateral knee pain 07/29/2020   Attention deficit hyperactivity disorder (ADHD) 09/28/2017   Insomnia, unspecified 09/28/2017   Acute meniscal tear of knee, left, subsequent encounter 02/10/2017   Left knee pain 09/01/2016   Obesity 01/14/2016   Rheumatoid arthritis (HCC) 01/14/2016   Hypothyroidism due to acquired atrophy of thyroid 01/14/2016   Osteopenia 09/15/2015   Bipolar 1 disorder (HCC) 08/21/2015   Gastroesophageal reflux disease without esophagitis 08/21/2015   Migraine 08/21/2015    Past Medical History:  Diagnosis Date   Bipolar 1 disorder (HCC)    Chronic headache    Depression    Gallstone    Hypertension    IBS (irritable bowel syndrome)    Obesity    Rheumatoid arthritis (HCC)    Thyroid disease    Ulcer     Family History  Problem Relation Age of Onset   Hypertension Mother    Heart attack Father    Healthy Sister    Healthy Sister    Healthy Sister    Healthy Brother    Healthy Son    Rheum arthritis Paternal Uncle    Colon cancer Neg Hx     Esophageal cancer Neg Hx    Colon polyps Neg Hx    Rectal cancer Neg Hx    Stomach cancer Neg Hx    Past Surgical History:  Procedure Laterality Date   ABDOMINAL HYSTERECTOMY     APPENDECTOMY     CHOLECYSTECTOMY     COLONOSCOPY  09/07/2018   CYSTECTOMY     dermoid   ESOPHAGOGASTRODUODENOSCOPY     about 20 years ago to check for ulcers   KNEE ARTHROSCOPY Left 2019   MYOMECTOMY     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 49  UPPER GASTROINTESTINAL ENDOSCOPY     Social History   Social History Narrative   Not on file   Immunization History  Administered Date(s) Administered   Influenza,inj,Quad PF,6+ Mos 01/14/2016, 09/13/2017   Influenza-Unspecified 01/14/2016, 10/31/2016, 09/13/2017, 12/17/2017, 08/06/2018   Pneumococcal Conjugate-13 03/24/2014   Pneumococcal Polysaccharide-23 05/26/2014   Pneumococcal-Unspecified 05/26/2014   Tdap 11/10/2017   Zoster Recombinant(Shingrix) 01/31/2022     Objective: Vital Signs: BP 114/77 (BP Location: Left Arm, Patient Position: Sitting, Cuff Size: Large)   Pulse 77   Resp 14   Ht 5\' 8"  (1.727 m)   Wt (!) 340 lb (154.2 kg)   BMI 51.70 kg/m    Physical Exam Constitutional:      Appearance: She is obese.  Cardiovascular:     Rate and Rhythm: Normal rate and regular rhythm.  Pulmonary:     Effort: Pulmonary effort is normal.     Breath sounds: Normal breath sounds.  Skin:    General: Skin is warm and dry.     Comments: Superficial vein varicosities both legs No pitting edema Mild tenderness to pressure over distal calf and shin  Neurological:     Mental Status: She is alert.  Psychiatric:        Mood and Affect: Mood normal.       Musculoskeletal Exam:  Shoulders full ROM no tenderness or swelling Elbows full ROM no tenderness or swelling Wrists full ROM no tenderness or swelling Fingers full ROM no tenderness or swelling Knees full ROM no tenderness or swelling Ankles full ROM no tenderness or swelling  Investigation: No  additional findings.  Imaging: No results found.  Recent Labs: Lab Results  Component Value Date   WBC 4.2 10/03/2022   HGB 13.1 10/03/2022   PLT 312 10/03/2022   NA 139 10/03/2022   K 4.6 10/03/2022   CL 103 10/03/2022   CO2 26 10/03/2022   GLUCOSE 89 10/03/2022   BUN 8 10/03/2022   CREATININE 0.80 10/03/2022   BILITOT 0.5 10/03/2022   ALKPHOS 99 01/25/2021   AST 29 10/03/2022   ALT 27 10/03/2022   PROT 7.3 10/03/2022   ALBUMIN 4.2 01/25/2021   CALCIUM 9.6 10/03/2022   GFRAA >60 04/23/2019   QFTBGOLDPLUS NEGATIVE 05/16/2022    Speciality Comments: PLQ eye exam: 03/15/2023 WNL [facility] [place] f/u 12 months  Procedures:  No procedures performed Allergies: Coconut (cocos nucifera), Codeine, Sulfa antibiotics, and Trazodone and nefazodone   Assessment / Plan:     Visit Diagnoses: Rheumatoid arthritis with negative rheumatoid factor, involving unspecified site (HCC) - Plan: Sedimentation rate, hydroxychloroquine (PLAQUENIL) 200 MG tablet, diclofenac (VOLTAREN) 25 MG EC tablet, Upadacitinib ER (RINVOQ) 15 MG TB24 Managed with Rinvoq, Plaquenil, and diclofenac. No major flare-ups. Morning stiffness is minimal and manageable. - Check sed rate to monitor rheumatoid arthritis status. - Continue rinvoq 15 mg daily - Continue HCQ 400 mg daily - Continue diclofenac 25 mg BID  High risk medication use - Rinvoq 15 mg daily, hydroxychloroquine 200 mg twice daily, diclofenac 25 mg twice daily as needed. PLQ eye exam 05/20/2021 WNL. Needs updated PLQ eye exam. - Plan: CBC with Differential/Platelet, COMPLETE METABOLIC PANEL WITH GFR - Checking CBC and CMP for medicaiton monitoring  Chronic venous insufficiency Ankle swelling and leg tenderness due to chronic venous insufficiency and varicose veins. Symptoms mild. - Advise leg elevation and walking to reduce swelling. - Consider stretching or rolling exercises for tight calf muscles.  Menopausal symptoms Improvement in  flushing, sweating, and  night sweats with hormone therapy, leading to better sleep quality.     Orders: Orders Placed This Encounter  Procedures   Sedimentation rate   CBC with Differential/Platelet   COMPLETE METABOLIC PANEL WITH GFR   Meds ordered this encounter  Medications   hydroxychloroquine (PLAQUENIL) 200 MG tablet    Sig: Take 1 tablet (200 mg total) by mouth 2 (two) times daily.    Dispense:  180 tablet    Refill:  1   diclofenac (VOLTAREN) 25 MG EC tablet    Sig: Take 1 tablet (25 mg total) by mouth 2 (two) times daily as needed.    Dispense:  180 tablet    Refill:  1   Upadacitinib ER (RINVOQ) 15 MG TB24    Sig: Take 1 tablet (15 mg total) by mouth daily.    Dispense:  90 tablet    Refill:  0    Prescription Type::   Renewal     Follow-Up Instructions: Return in about 6 months (around 09/28/2023) for RA on UPA/HCQ/NSAID f/u 6mos.   Fuller Plan, MD  Note - This record has been created using [employer].  Chart creation errors have been sought, but may not always  have been located. Such creation errors do not reflect on  the standard of medical care.

## 2023-03-20 ENCOUNTER — Other Ambulatory Visit: Payer: Self-pay | Admitting: Physician Assistant

## 2023-03-20 ENCOUNTER — Other Ambulatory Visit: Payer: Self-pay | Admitting: Family Medicine

## 2023-03-20 DIAGNOSIS — G43909 Migraine, unspecified, not intractable, without status migrainosus: Secondary | ICD-10-CM

## 2023-03-21 ENCOUNTER — Other Ambulatory Visit: Payer: Self-pay | Admitting: Internal Medicine

## 2023-03-21 DIAGNOSIS — M0600 Rheumatoid arthritis without rheumatoid factor, unspecified site: Secondary | ICD-10-CM

## 2023-03-21 NOTE — Telephone Encounter (Signed)
 Last Fill: 02/28/2023  Eye exam: 03/15/2023 WNL   Labs: 10/03/2022 Sedimentation rate remains normal. CBC and CMP also normal no problem for continuing Rinvoq and diclofenac.   Next Visit: 03/28/2023  Last Visit: 08/31/2022  [id number] arthritis with negative rheumatoid factor, involving unspecified site [facility])   Current Dose per office note 08/31/2022: hydroxychloroquine 400 mg daily  Patient to update labs at upcomming appointment.    Okay to refill Plaquenil?

## 2023-03-24 ENCOUNTER — Other Ambulatory Visit (HOSPITAL_COMMUNITY): Payer: Self-pay

## 2023-03-24 MED FILL — Hydroxychloroquine Sulfate Tab 200 MG: ORAL | 30 days supply | Qty: 60 | Fill #0 | Status: AC

## 2023-03-27 ENCOUNTER — Other Ambulatory Visit: Payer: Self-pay | Admitting: Family Medicine

## 2023-03-28 ENCOUNTER — Other Ambulatory Visit (HOSPITAL_COMMUNITY): Payer: Self-pay

## 2023-03-28 ENCOUNTER — Encounter: Payer: Self-pay | Admitting: Internal Medicine

## 2023-03-28 ENCOUNTER — Ambulatory Visit: Payer: Managed Care, Other (non HMO) | Attending: Internal Medicine | Admitting: Internal Medicine

## 2023-03-28 VITALS — BP 114/77 | HR 77 | Resp 14 | Ht 68.0 in | Wt 340.0 lb

## 2023-03-28 DIAGNOSIS — Z79899 Other long term (current) drug therapy: Secondary | ICD-10-CM

## 2023-03-28 DIAGNOSIS — M0600 Rheumatoid arthritis without rheumatoid factor, unspecified site: Secondary | ICD-10-CM

## 2023-03-28 MED ORDER — HYDROXYCHLOROQUINE SULFATE 200 MG PO TABS: 200.0000 mg | ORAL_TABLET | Freq: Two times a day (BID) | ORAL | 1 refills | Status: DC

## 2023-03-28 MED ORDER — DICLOFENAC SODIUM 25 MG PO TBEC: 25.0000 mg | DELAYED_RELEASE_TABLET | Freq: Two times a day (BID) | ORAL | 1 refills | Status: DC | PRN

## 2023-03-28 MED ORDER — OMEPRAZOLE 40 MG PO CPDR
40.0000 mg | DELAYED_RELEASE_CAPSULE | Freq: Two times a day (BID) | ORAL | 0 refills | Status: DC
  Filled 2023-03-28: qty 180, 90d supply, fill #0

## 2023-03-28 MED ORDER — RINVOQ 15 MG PO TB24: 1.0000 | ORAL_TABLET | Freq: Every day | ORAL | 0 refills | Status: DC

## 2023-03-29 ENCOUNTER — Other Ambulatory Visit: Payer: Self-pay | Admitting: Physician Assistant

## 2023-03-29 ENCOUNTER — Other Ambulatory Visit (HOSPITAL_COMMUNITY): Payer: Self-pay

## 2023-03-29 ENCOUNTER — Encounter: Payer: Self-pay | Admitting: Physician Assistant

## 2023-03-29 ENCOUNTER — Telehealth (INDEPENDENT_AMBULATORY_CARE_PROVIDER_SITE_OTHER): Payer: BLUE CROSS/BLUE SHIELD | Admitting: Physician Assistant

## 2023-03-29 DIAGNOSIS — G4700 Insomnia, unspecified: Secondary | ICD-10-CM | POA: Diagnosis not present

## 2023-03-29 DIAGNOSIS — F319 Bipolar disorder, unspecified: Secondary | ICD-10-CM | POA: Diagnosis not present

## 2023-03-29 DIAGNOSIS — G2581 Restless legs syndrome: Secondary | ICD-10-CM

## 2023-03-29 DIAGNOSIS — Z635 Disruption of family by separation and divorce: Secondary | ICD-10-CM

## 2023-03-29 DIAGNOSIS — F902 Attention-deficit hyperactivity disorder, combined type: Secondary | ICD-10-CM | POA: Diagnosis not present

## 2023-03-29 DIAGNOSIS — F40243 Fear of flying: Secondary | ICD-10-CM | POA: Diagnosis not present

## 2023-03-29 MED ORDER — AMPHETAMINE-DEXTROAMPHETAMINE 10 MG PO TABS: 10.0000 mg | ORAL_TABLET | Freq: Every day | ORAL | 0 refills | Status: DC

## 2023-03-29 MED ORDER — AMPHETAMINE-DEXTROAMPHET ER 30 MG PO CP24
30.0000 mg | ORAL_CAPSULE | Freq: Every day | ORAL | 0 refills | Status: DC
  Filled 2023-05-15: qty 30, 30d supply, fill #0

## 2023-03-29 MED ORDER — GABAPENTIN 300 MG PO CAPS: 300.0000 mg | ORAL_CAPSULE | Freq: Every day | ORAL | 1 refills | Status: DC

## 2023-03-29 MED ORDER — AMPHETAMINE-DEXTROAMPHET ER 30 MG PO CP24
30.0000 mg | ORAL_CAPSULE | Freq: Every day | ORAL | 0 refills | Status: DC
  Filled 2023-04-12: qty 30, 30d supply, fill #0

## 2023-03-29 MED ORDER — AMPHETAMINE-DEXTROAMPHET ER 30 MG PO CP24
30.0000 mg | ORAL_CAPSULE | Freq: Every day | ORAL | 0 refills | Status: DC
  Filled 2023-06-12: qty 30, 30d supply, fill #0

## 2023-03-29 MED ORDER — AMPHETAMINE-DEXTROAMPHETAMINE 10 MG PO TABS
10.0000 mg | ORAL_TABLET | Freq: Every day | ORAL | 0 refills | Status: DC
  Filled 2023-03-29: qty 30, 30d supply, fill #0

## 2023-03-29 MED ORDER — AMITRIPTYLINE HCL 100 MG PO TABS: 100.0000 mg | ORAL_TABLET | Freq: Every day | ORAL | 1 refills | Status: DC

## 2023-03-29 NOTE — Progress Notes (Signed)
 [facility]  Patient ID: Grace Butler,  MRN: [id number]  PCP: Pearline Cables, MD  Date of Evaluation: 03/29/2023 Time spent:20 minutes  Chief Complaint:  Chief Complaint   Depression; ADD; Insomnia    Virtual Visit via Telehealth  I connected with patient by a video enabled telemedicine application with their informed consent, and verified patient privacy and that I am speaking with the correct person using two identifiers.  I am private, in my office and the patient is at work.  I discussed the limitations, risks, security and privacy concerns of performing an evaluation and management service by video and the availability of in person appointments. I also discussed with the patient that there may be a patient responsible charge related to this service. The patient expressed understanding and agreed to proceed.   I discussed the assessment and treatment plan with the patient. The patient was provided an opportunity to ask questions and all were answered. The patient agreed with the plan and demonstrated an understanding of the instructions.   The patient was advised to call back or seek an in-person evaluation if the symptoms worsen or if the condition fails to improve as anticipated.  I provided 20 minutes of non-face-to-face time during this encounter.  HISTORY/CURRENT STATUS: HPI for 48-month med check.  Her main problem right now is insomnia.  She has trouble both falling asleep and staying asleep.  She has had trouble off and on for years but it has gotten worse in the past few months.  She is already practicing good sleep hygiene.  She takes all of her medications as directed.  She and her wife's divorce is almost final.  Patient is able to enjoy things.  Energy and motivation are good.  Work is going well.   No extreme sadness, tearfulness, or feelings of hopelessness.  ADLs and personal hygiene are normal.   No decreased memory. Appetite has not changed.   Weight is stable.  Has anxiety occas but only takes the Xanax before flying. It's still effective.  Denies suicidal or homicidal thoughts.  The Adderall is still effective.  States that attention is good without easy distractibility.  Able to focus on things and finish tasks to completion.   Patient denies increased energy with decreased need for sleep, increased talkativeness, racing thoughts, impulsivity or risky behaviors, increased spending, increased libido, grandiosity, increased irritability or anger, paranoia, or hallucinations.  Denies dizziness, syncope, seizures, numbness, tingling, tremor, tics, unsteady gait, slurred speech, confusion. Denies muscle or joint pain, stiffness, or dystonia. Denies unexplained weight loss, frequent infections, or sores that heal slowly.  No polyphagia, polydipsia, or polyuria. Denies visual changes or paresthesias.   Individual Medical History/ Review of Systems: Changes? :No   RA is stable.  Past medications for mental health diagnoses include: Adderall XR, Seroquel, Abilify, trazodone, Serzone, Elavil, Xanax, Ambien caused vivid nightmares  Allergies: Coconut (cocos nucifera), Codeine, Sulfa antibiotics, and Trazodone and nefazodone  Current Medications:  Current Outpatient Medications:    amitriptyline (ELAVIL) 100 MG tablet, Take 1 tablet (100 mg total) by mouth at bedtime., Disp: 90 tablet, Rfl: 1   ARIPiprazole (ABILIFY) 15 MG tablet, TAKE 1 TABLET DAILY, Disp: 90 tablet, Rfl: 0   Ascorbic Acid (VITAMIN C) 100 MG tablet, Take 100 mg by mouth daily., Disp: , Rfl:    calcium citrate-vitamin D (CITRACAL+D) 315-200 MG-UNIT tablet, Take 1 tablet by mouth 2 (two) times daily., Disp: , Rfl:    cholecalciferol (VITAMIN D) 1000 UNITS tablet,  Take 1,000 Units by mouth daily., Disp: , Rfl:    diclofenac (VOLTAREN) 25 MG EC tablet, Take 1 tablet (25 mg total) by mouth 2 (two) times daily as needed., Disp: 180 tablet, Rfl: 1   estradiol (CLIMARA - DOSED IN  MG/24 HR) 0.0375 mg/24hr patch, Place 0.0375 mg onto the skin once a week., Disp: , Rfl:    hydroxychloroquine (PLAQUENIL) 200 MG tablet, Take 1 tablet (200 mg total) by mouth 2 (two) times daily., Disp: 180 tablet, Rfl: 1   levothyroxine (SYNTHROID) 175 MCG tablet, Take 1 tablet (175 mcg total) by mouth daily before breakfast., Disp: 90 tablet, Rfl: 3   lisinopril (ZESTRIL) 10 MG tablet, Take 1 tablet (10 mg total) by mouth daily., Disp: 90 tablet, Rfl: 3   omeprazole (PRILOSEC) 40 MG capsule, Take 1 capsule (40 mg total) by mouth in the morning and at bedtime., Disp: 180 capsule, Rfl: 0   QUEtiapine (SEROQUEL) 50 MG tablet, TAKE 3 TABLETS AT BEDTIME, Disp: 270 tablet, Rfl: 0   Sodium Hyaluronate, oral, (HYALURONIC ACID PO), Take by mouth., Disp: , Rfl:    Specialty Vitamins Products (MAGNESIUM, AMINO ACID CHELATE,) 133 MG tablet, Take 1 tablet by mouth 2 (two) times daily., Disp: , Rfl:    Upadacitinib ER (RINVOQ) 15 MG TB24, Take 1 tablet (15 mg total) by mouth daily., Disp: 90 tablet, Rfl: 0   amoxicillin-clavulanate (AUGMENTIN) 875-125 MG tablet, Take 1 tablet by mouth 2 (two) times daily. (Patient not taking: Reported on 03/28/2023), Disp: 20 tablet, Rfl: 0   [START ON 04/12/2023] amphetamine-dextroamphetamine (ADDERALL XR) 30 MG 24 hr capsule, Take 1 capsule (30 mg total) by mouth daily., Disp: 30 capsule, Rfl: 0   [START ON 05/11/2023] amphetamine-dextroamphetamine (ADDERALL XR) 30 MG 24 hr capsule, Take 1 capsule (30 mg total) by mouth daily., Disp: 30 capsule, Rfl: 0   [START ON 06/10/2023] amphetamine-dextroamphetamine (ADDERALL XR) 30 MG 24 hr capsule, Take 1 capsule (30 mg total) by mouth daily., Disp: 30 capsule, Rfl: 0   [START ON 04/28/2023] amphetamine-dextroamphetamine (ADDERALL) 10 MG tablet, Take 1 tablet (10 mg total) by mouth daily with lunch., Disp: 30 tablet, Rfl: 0   [START ON 05/27/2023] amphetamine-dextroamphetamine (ADDERALL) 10 MG tablet, Take 1 tablet (10 mg total) by mouth daily  with lunch., Disp: 30 tablet, Rfl: 0   amphetamine-dextroamphetamine (ADDERALL) 10 MG tablet, Take 1 tablet (10 mg total) by mouth daily with lunch., Disp: 30 tablet, Rfl: 0   butalbital-acetaminophen-caffeine (FIORICET) 50-325-40 MG tablet, Take 1-2 tablets by mouth every 6 (six) hours as needed for headache. Max 6 per 24 hours (Patient not taking: Reported on 03/28/2023), Disp: 14 tablet, Rfl: 0   furosemide (LASIX) 20 MG tablet, Take 1 tablet (20 mg total) by mouth daily as needed for leg edema (Patient not taking: Reported on 03/28/2023), Disp: 30 tablet, Rfl: 3   gabapentin (NEURONTIN) 300 MG capsule, Take 1 capsule (300 mg total) by mouth at bedtime., Disp: 90 capsule, Rfl: 1   rizatriptan (MAXALT-MLT) 10 MG disintegrating tablet, Take 1 tablet (10 mg total) by mouth as needed for migraine. May repeat in 2 hours if needed.  Office visit is needed please (Patient not taking: Reported on 03/28/2023), Disp: 10 tablet, Rfl: 0   valACYclovir (VALTREX) 1000 MG tablet, Take 1 tablet (1,000 mg total) by mouth daily. Take for 5 days for outbreak as needed (Patient not taking: Reported on 03/29/2023), Disp: 30 tablet, Rfl: 1   Zoster Vaccine Adjuvanted [facility]) injection, Inject into the muscle. (  Patient not taking: Reported on 05/16/2022), Disp: 1 mL, Rfl: 0 Medication Side Effects: none  Family Medical/ Social History: Changes?  See HPI  MENTAL HEALTH EXAM:  There were no vitals taken for this visit.There is no height or weight on file to calculate BMI.  General Appearance: Casual, Well Groomed, and Obese  Eye Contact:  Good  Speech:  Clear and Coherent and Normal Rate  Volume:  Normal  Mood:  Euthymic  Affect:  Congruent  Thought Process:  Goal Directed and Descriptions of Associations: Intact  Orientation:  Full (Time, Place, and Person)  Thought Content: Logical   Suicidal Thoughts:  No  Homicidal Thoughts:  No  Memory:  WNL  Judgement:  Good  Insight:  Good  Psychomotor Activity:  Normal   Concentration:  Concentration: Good and Attention Span: Good  Recall:  Good  Fund of Knowledge: Good  Language: Good  Assets:  Desire for Improvement Financial Resources/Insurance Housing Transportation Vocational/Educational  ADL's:  Intact  Cognition: WNL  Prognosis:  Good    PCP follows labs  DIAGNOSES:    ICD-10-CM   1. Bipolar I disorder (HCC)  F31.9     2. Attention deficit hyperactivity disorder (ADHD), combined type  F90.2     3. Insomnia, unspecified type  G47.00     4. 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 Restless leg syndrome  G25.81     6. Divorce  Z63.5      Receiving Psychotherapy: No   RECOMMENDATIONS:  PDMP was reviewed.  Adderall XR filled 03/13/2023.  Adderall IR filled 11/24/2022.  Gabapentin filled 01/10/2023.  Xanax filled 09/12/2022. I provided 20 minutes of non-face-to-face time during this encounter, including time spent before and after the visit in records review, medical decision making, counseling pertinent to today's visit, and charting.   We discussed the insomnia.  We agree to increase the amitriptyline.  Continue Xanax 1 mg before flying prn. Increase amitriptyline to 100 mg p.o. nightly for migraine prevention as well as insomnia.   Continue Adderall XR 30 mg, 1 q am. Continue Adderall 10 mg, 1 at lunch.  Continue Abilify 15 mg, 1 p.o. daily. Continue gabapentin 300 mg, 1 p.o. nightly. Continue Seroquel 50 mg, 3 p.o. nightly.   Strongly recommend counseling.  Return in 6 months.  Melony Overly, PA-C

## 2023-04-07 ENCOUNTER — Other Ambulatory Visit (HOSPITAL_COMMUNITY): Payer: Self-pay

## 2023-04-07 ENCOUNTER — Other Ambulatory Visit: Payer: Self-pay | Admitting: Physician Assistant

## 2023-04-11 ENCOUNTER — Encounter (INDEPENDENT_AMBULATORY_CARE_PROVIDER_SITE_OTHER): Payer: Self-pay | Admitting: Family Medicine

## 2023-04-12 ENCOUNTER — Other Ambulatory Visit (HOSPITAL_COMMUNITY): Payer: Self-pay

## 2023-04-12 MED ORDER — TIRZEPATIDE 2.5 MG/0.5ML ~~LOC~~ SOAJ
2.5000 mg | SUBCUTANEOUS | Status: DC
Start: 2023-04-12 — End: 2023-04-26

## 2023-04-12 NOTE — Telephone Encounter (Signed)
Please see the MyChart message reply(ies) for my assessment and plan.  The patient gave consent for this Medical Advice Message and is aware that it may result in a bill to their insurance company as well as the possibility that this may result in a co-payment or deductible. They are an established patient, but are not seeking medical advice exclusively about a problem treated during an in person or video visit in the last 7 days. I did not recommend an in person or video visit within 7 days of my reply.  I spent a total of 12 minutes cumulative time within 7 days through [facility] messaging Lamar Blinks, MD

## 2023-04-13 ENCOUNTER — Other Ambulatory Visit (HOSPITAL_COMMUNITY): Payer: Self-pay

## 2023-04-19 ENCOUNTER — Telehealth: Payer: Self-pay | Admitting: Physician Assistant

## 2023-04-19 NOTE — Telephone Encounter (Signed)
 Pt seen 3/26:  She reports still not sleeping despite the increased dose of amitriptyline. Reviewed sleep hygiene with her. She does have screen time, reports setting timer on her phone to stop 15 minutes before bed. Caffeine in the morning and she is on Adderall am and afternoon. She questions if she should increase Seroquel instead of amitriptyline.  No new stressors, but reports job is stressful. Pharmacy is [facility].   We discussed the insomnia.  We agree to increase the amitriptyline.   Continue Xanax 1 mg before flying prn. Increase amitriptyline to 100 mg p.o. nightly for migraine prevention as well as insomnia.   Continue Adderall XR 30 mg, 1 q am. Continue Adderall 10 mg, 1 at lunch.  Continue Abilify 15 mg, 1 p.o. daily. Continue gabapentin 300 mg, 1 p.o. nightly. Continue Seroquel 50 mg, 3 p.o. nightly.

## 2023-04-19 NOTE — Telephone Encounter (Signed)
 Grace Butler called at 11:30 to report that the amitriptyline is not working.  She hasn't slept in three days.  She needs to know other options.  Please call to discuss.  Appt 9/23

## 2023-04-20 ENCOUNTER — Other Ambulatory Visit (HOSPITAL_COMMUNITY): Payer: Self-pay

## 2023-04-20 ENCOUNTER — Other Ambulatory Visit: Payer: Self-pay

## 2023-04-20 MED ORDER — QUETIAPINE FUMARATE 200 MG PO TABS
200.0000 mg | ORAL_TABLET | Freq: Every day | ORAL | 1 refills | Status: DC
Start: 2023-04-20 — End: 2023-06-12
  Filled 2023-04-20: qty 45, 30d supply, fill #0
  Filled 2023-05-13: qty 45, 30d supply, fill #1

## 2023-04-20 NOTE — Telephone Encounter (Signed)
 Yes, increase Seroquel to 200 mg, #45, 1- 1.5 po at bedtime for sleep. RF 1.

## 2023-04-20 NOTE — Telephone Encounter (Signed)
 Rx sent as requested, patient notified.

## 2023-04-26 ENCOUNTER — Other Ambulatory Visit (HOSPITAL_COMMUNITY): Payer: Self-pay

## 2023-04-26 MED ORDER — ZEPBOUND 5 MG/0.5ML ~~LOC~~ SOAJ: 5.0000 mg | SUBCUTANEOUS | 2 refills | Status: DC

## 2023-04-26 NOTE — Addendum Note (Signed)
 Addended by: Kaylee Partridge on: 04/26/2023 08:45 PM   Modules accepted: Orders

## 2023-05-09 ENCOUNTER — Telehealth: Payer: Self-pay | Admitting: Pharmacist

## 2023-05-09 NOTE — Telephone Encounter (Signed)
 Submitted a Prior Authorization RENEWAL request to [employer] for RINVOQ  via CoverMyMeds. Will update once we receive a response.  Key: [id number]   Geraldene Kleine, PharmD, MPH, BCPS, CPP Clinical Pharmacist (Rheumatology and Pulmonology)

## 2023-05-09 NOTE — Telephone Encounter (Signed)
 Received notification from [employer] regarding a prior authorization for RINVOQ . Authorization has been APPROVED from 05/09/23 to 05/08/24.   Authorization # [id number]  Geraldene Kleine, PharmD, MPH, BCPS, CPP Clinical Pharmacist (Rheumatology and Pulmonology)

## 2023-05-15 ENCOUNTER — Other Ambulatory Visit (HOSPITAL_COMMUNITY): Payer: Self-pay

## 2023-06-12 ENCOUNTER — Other Ambulatory Visit (HOSPITAL_COMMUNITY): Payer: Self-pay

## 2023-06-12 ENCOUNTER — Other Ambulatory Visit: Payer: Self-pay | Admitting: Physician Assistant

## 2023-06-13 MED ORDER — QUETIAPINE FUMARATE 200 MG PO TABS
200.0000 mg | ORAL_TABLET | Freq: Every day | ORAL | 1 refills | Status: DC
  Filled 2023-06-13: qty 45, 30d supply, fill #0
  Filled 2023-07-11: qty 45, 30d supply, fill #1

## 2023-06-14 ENCOUNTER — Other Ambulatory Visit (HOSPITAL_COMMUNITY): Payer: Self-pay

## 2023-06-16 ENCOUNTER — Other Ambulatory Visit: Payer: Self-pay | Admitting: Physician Assistant

## 2023-06-16 ENCOUNTER — Encounter: Payer: Self-pay | Admitting: Family Medicine

## 2023-06-20 ENCOUNTER — Other Ambulatory Visit: Payer: Self-pay | Admitting: Family Medicine

## 2023-06-20 ENCOUNTER — Other Ambulatory Visit (HOSPITAL_COMMUNITY): Payer: Self-pay

## 2023-06-20 MED ORDER — OMEPRAZOLE 40 MG PO CPDR
40.0000 mg | DELAYED_RELEASE_CAPSULE | Freq: Two times a day (BID) | ORAL | 0 refills | Status: DC
  Filled 2023-06-20: qty 60, 30d supply, fill #0

## 2023-06-27 ENCOUNTER — Other Ambulatory Visit: Payer: Self-pay | Admitting: Internal Medicine

## 2023-06-27 DIAGNOSIS — Z111 Encounter for screening for respiratory tuberculosis: Secondary | ICD-10-CM

## 2023-06-27 DIAGNOSIS — Z9225 Personal history of immunosupression therapy: Secondary | ICD-10-CM

## 2023-06-27 DIAGNOSIS — M0600 Rheumatoid arthritis without rheumatoid factor, unspecified site: Secondary | ICD-10-CM

## 2023-06-27 DIAGNOSIS — Z79899 Other long term (current) drug therapy: Secondary | ICD-10-CM

## 2023-06-27 NOTE — Telephone Encounter (Signed)
 Last Fill: 03/28/2023  Labs: 10/03/2022 Sedimentation rate remains normal. CBC and CMP also normal no problem for continuing Rinvoq  and diclofenac .   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

## 2023-06-28 ENCOUNTER — Ambulatory Visit: Attending: Internal Medicine | Admitting: Internal Medicine

## 2023-06-28 ENCOUNTER — Encounter: Payer: Self-pay | Admitting: Internal Medicine

## 2023-06-28 VITALS — BP 110/78 | HR 68 | Resp 16 | Ht 68.0 in | Wt 320.1 lb

## 2023-06-28 DIAGNOSIS — M25562 Pain in left knee: Secondary | ICD-10-CM | POA: Diagnosis not present

## 2023-06-28 DIAGNOSIS — Z79899 Other long term (current) drug therapy: Secondary | ICD-10-CM

## 2023-06-28 DIAGNOSIS — M25561 Pain in right knee: Secondary | ICD-10-CM

## 2023-06-28 DIAGNOSIS — G8929 Other chronic pain: Secondary | ICD-10-CM

## 2023-06-28 DIAGNOSIS — M0600 Rheumatoid arthritis without rheumatoid factor, unspecified site: Secondary | ICD-10-CM | POA: Diagnosis not present

## 2023-06-28 NOTE — Progress Notes (Signed)
 Office Visit Note  Patient: Grace Butler             Date of Birth: 1970/03/28           MRN: [id number]             PCP: Watt Harlene BROCKS, MD Referring: Watt Harlene BROCKS, MD Visit Date: 06/28/2023   Subjective:  Follow-up     Discussed the use of AI scribe software for clinical note transcription with the patient, who gave verbal consent to proceed.  History of Present Illness   Grace Butler is a 53 year old female who presents for a follow-up for seronegative rheumatoid arthritis on Rinvoq  15 mg p.o. daily, hydroxychloroquine  400 mg daily, and diclofenac  25 mg twice daily as needed.  She has lost 20 pounds over the past two months, which she attributes to the use of tirzepatide  . She started the medication approximately one and a half to two months ago, beginning at a dose of 2.5 mg and increasing to 5 mg, with plans to increase to 7 or 7.5 mg next month. She experiences nausea for about a week after each dose increase, but it resolves thereafter. She also reports diarrhea but no constipation.  There is a significant improvement in her knee pain, and she has not experienced any arthritis flare-ups recently, despite a stressful work environment. She is currently taking Rinvoq  and Plaquenil , and her recent eye exam was normal.  In terms of her social history, she works in Sales promotion account executive and describes challenges with hiring and staffing at her workplace. She mentions a reduction in sugar intake, noting that she no longer craves sweets as she did before starting the medication.       Previous HPI 03/28/23 Grace Butler is a 53 y.o. female here for follow up for seropositive RA on Rinvoq  15 mg daily, hydroxychloroquine  400 mg daily, and diclofenac  25 mg twice daily as needed.     She is currently taking Rinvoq , Plaquenil , and diclofenac  for arthritis. She has not experienced any major flare-ups recently and describes her condition as 'pretty good' despite personal stressors. Morning  stiffness lasts about five to ten minutes and is 'noticeable but not super long.'   Menopausal symptoms, including flushing, sweating, and night sweats, have improved with hormone therapy. She is relieved that these symptoms are 'gone' and appreciates a 'great night's sleep.'   She experiences fluid retention in her ankles, particularly after prolonged sitting. Pain occurs during leg massages, described as 'hurts' when touched, and is associated with her varicose veins. The pain is localized to her calves and shins. No significant swelling or fluid retention beyond what is described.   She had a sinus infection about a month ago, treated with a steroid taper and Augmentin , which resolved the infection. She has not received a flu shot this season but has otherwise avoided illness.         Previous HPI 08/31/2022 Grace Butler is a 53 y.o. female here for follow up for seropositive RA on Rinvoq  15 mg daily, hydroxychloroquine  400 mg daily, and diclofenac  25 mg twice daily as needed.  Overall hip symptoms have been well-controlled without any major exacerbation and no significant illness.  Earlier this month she did experience an increase in low back pain also a popping sensation of the right shoulder with some pain mostly just from certain arm movements.  This was all during repetitive lifting activities associate with a move.  She took muscle relaxer  and 5-day course of prednisone  20 mg symptoms are improved.     Previous HPI 05/16/2022 Grace Butler is a 53 y.o. female here for follow up for seropositive RA on Rinvoq  15 mg daily hydroxychloroquine  400 mg daily and diclofenac  25 mg twice daily as needed.  Somewhat prolonged time since her last follow-up as she has had many major life events going on including change of work and marriage separation.  Without significant flareup over the winter or throughout this time.  Has not had any serious interval infections or complications.  No new falls or  injuries.   Previous HPI 08/30/21 Grace Butler is a 53 y.o. female here for follow up for seronegative RA on Rinvoq  15 mg daily hydroxychloroquine  400 mg daily and diclofenac  25 mg as needed.  She has not had any major flareups or exacerbations since her last visit or required any steroid treatment.  No new infections or antibiotic treatment required.  This morning at work she fell describes as tripping over her own foot and landed incurring some abrasion on her right elbow as well as lateral right knee and right hip.   Previous HPI 05/26/2021 Grace Butler is a 53 y.o. female here for follow up  for seronegative RA on Rinvoq  15mg  po qd, HCQ 400 mg PO daily, and diclofenac  25 mg PRN. She is doing well overall since last visit no major flare ups. No infections or antibiotic treatments needed. She does not have many bad days recently. She had lab testing drawn on 05/06/21 with her family medicine office including CBC, CMP, and TSH which were normal. Her wife was newly diagnosed with colorectal cancer and undergoing neoadjuvant chemoradiation therapy so she has increased stress and missed work time due to that.   Previous HPI 01/19/2021 Grace Butler is a 53 y.o. female here for follow up for seronegative RA on Enbrel  50 mg [place] weekly, HCQ 400 mg PO daily, and diclofenac  25 mg PRN. She has been doing well most of the time without a RA flare up but for the last few weeks having increased stiffness in bilateral legs, also pain but less of a problem than the stiffness. She fell twice without major injury. She has an appointment to start working with a Systems analyst on Thursday plans to do resistance training for strengthening and ideally some weight loss. She is interested in information about Rinvoq  after hearing about the medication.   Previous HPI 10/20/20 Grace Butler is a 53 y.o. female here for follow up for seronegative rheumatoid arthritis after restarting treatment with Enbrel  50 mg [place] weekly  with continued HCQ 400 mg PO daily. Workup at that visit with negative inflammatory serology, xray of left knee does show osteoarthritis changes. She had been feeling pretty well after starting Enbrel  although continuing to take diclofenac  25 mg BID regularly. However in the past few weeks she is feeling worse, with several major life and family stressors going on. 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 Her joint pain is worse especially knees and ankles. Not especially stiff though. Recent fall onto right elbow and right knee without major injury. Had right knee injection with orthopedic surgery for pain and swelling there, with an improvement.   Previous HPI 07/29/20 Chelly B Milillo is a 53 y.o. female here for transition of  care for seronegative rheumatoid arthritis previously managed with [facility] rheumatology.  Rheumatoid arthritis was diagnosed in 2013 with development of bilateral hand and feet swelling and morning stiffness.  Previous treatments include methotrexate stopped due to elevated transaminases.  She was treated with Humira for about 6 months but with increasing injection site reaction and pain did not tolerate this well.  She was switched to Enbrel  continued this for several months but also stopped due to the same problem of local injection site reactions and pain.  She is not sure whether the medicine was ever effective due to early discontinuation.  She started [place] in 2016 she felt greatly improved her symptoms continue this medicine until 2021 she does question whether there was loss of efficacy.  After Earma she started hydroxychloroquine  has been on this since October 2021 does not feel this medication has been highly effective for her joint pain.  Currently she complains of daily pain worst in her bilateral  knees and feet.  She describes the pain as sharp but states her foot pain feels more like a pressure sensation.  The knee pain is exacerbated climbing stairs.  She notices stiffness pain and redness over her MCP joints does not describe discrete swelling in this area lately.  She is not sure whether there is knee or ankle swelling related to her arthritis body habitus and swelling and redness she gets in the feet from standing during the day.  Currently she takes Celebrex once daily in the mornings and takes Tylenol usually twice a day for her ongoing joint pain.  She previously took oral diclofenac  that she found helpful but was switched due to concern of long-term NSAID exposure.  She takes hydroxychloroquine  400 mg/day.   DMARD Hx MTX d/c LFTs Humira d/c injection site reactions Enbrel  d/c injection site reactions (old formulation) Earma ?loss of efficacy   Labs  02/2014 HBV/HCV neg RF neg CCP neg   Review of Systems  Constitutional: Negative.  Negative for fatigue.  HENT: Negative.  Negative for mouth sores and mouth dryness.   Eyes: Negative.  Negative for dryness.  Respiratory: Negative.  Negative for shortness of breath.   Cardiovascular: Negative.  Negative for chest pain and palpitations.  Gastrointestinal:  Positive for diarrhea. Negative for blood in stool and constipation.  Endocrine: Negative.  Negative for increased urination.  Genitourinary: Negative.  Negative for involuntary urination.  Musculoskeletal:  Positive for morning stiffness. Negative for joint pain, gait problem, joint pain, joint swelling, myalgias, muscle weakness, muscle tenderness and myalgias.  Skin: Negative.  Negative for color change, rash, hair loss and sensitivity to sunlight.  Allergic/Immunologic: Negative for susceptible to infections.  Neurological: Negative.  Negative for dizziness and headaches.  Hematological: Negative.  Negative for swollen glands.  Psychiatric/Behavioral: Negative.   Negative for depressed mood and sleep disturbance. The patient is not nervous/anxious.     PMFS History:  Patient Active Problem List   Diagnosis Date Noted   Acute non-recurrent pansinusitis 03/09/2021   High risk medication use 07/29/2020   Bilateral knee pain 07/29/2020   Attention deficit hyperactivity disorder (ADHD) 09/28/2017   Insomnia, unspecified 09/28/2017   Acute meniscal tear of knee, left, subsequent encounter 02/10/2017   Left knee pain 09/01/2016   Obesity 01/14/2016   Rheumatoid arthritis (HCC) 01/14/2016   Hypothyroidism due to acquired atrophy of thyroid  01/14/2016   Osteopenia 09/15/2015   Bipolar 1 disorder (HCC) 08/21/2015   Gastroesophageal reflux disease without esophagitis 08/21/2015   Migraine 08/21/2015  Past Medical History:  Diagnosis Date   Bipolar 1 disorder (HCC)    Chronic headache    Depression    Gallstone    Hypertension    IBS (irritable bowel syndrome)    Obesity    Rheumatoid arthritis (HCC)    Thyroid  disease    Ulcer     Family History  Problem Relation Age of Onset   Hypertension Mother    Heart attack Father    Healthy Sister    Healthy Sister    Healthy Sister    Healthy Brother    Healthy Son    Rheum arthritis Paternal Uncle    Colon cancer Neg Hx    Esophageal cancer Neg Hx    Colon polyps Neg Hx    Rectal cancer Neg Hx    Stomach cancer Neg Hx    Past Surgical History:  Procedure Laterality Date   ABDOMINAL HYSTERECTOMY     APPENDECTOMY     CHOLECYSTECTOMY     COLONOSCOPY  09/07/2018   CYSTECTOMY     dermoid   ESOPHAGOGASTRODUODENOSCOPY     about 20 years ago to check for ulcers   KNEE ARTHROSCOPY Left 2019   MYOMECTOMY     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 57   UPPER GASTROINTESTINAL ENDOSCOPY     Social History   Social History Narrative   Not on file   Immunization History  Administered Date(s) Administered   Influenza,inj,Quad PF,6+ Mos 01/14/2016, 09/13/2017   Influenza-Unspecified 01/14/2016,  10/31/2016, 09/13/2017, 12/17/2017, 08/06/2018   Pneumococcal Conjugate-13 03/24/2014, 05/26/2014   Pneumococcal Polysaccharide-23 05/26/2014   Pneumococcal-Unspecified 05/26/2014   Tdap 11/10/2017   Zoster Recombinant(Shingrix ) 01/31/2022     Objective: Vital Signs: BP 110/78 (BP Location: Left Arm, Patient Position: Sitting, Cuff Size: Normal)   Pulse 68   Resp 16   Ht 5' 8 (1.727 m)   Wt (!) 320 lb 1.6 oz (145.2 kg)   BMI 48.67 kg/m    Physical Exam Constitutional:      Appearance: She is obese.   Eyes:     Conjunctiva/sclera: Conjunctivae normal.    Cardiovascular:     Rate and Rhythm: Normal rate and regular rhythm.  Pulmonary:     Effort: Pulmonary effort is normal.     Breath sounds: Normal breath sounds.  Lymphadenopathy:     Cervical: No cervical adenopathy.   Skin:    General: Skin is warm and dry.     Comments: Superficial varicose veins no associated pitting edema, erythema, or tenderness   Neurological:     Mental Status: She is alert.   Psychiatric:        Mood and Affect: Mood normal.      Musculoskeletal Exam:  Shoulders full ROM no tenderness or swelling Elbows full ROM no tenderness or swelling Wrists full ROM no tenderness or swelling Fingers full ROM no tenderness or swelling Knees full ROM no tenderness or swelling, bilateral patellofemoral crepitus Ankles full ROM no tenderness or swelling   Investigation: No additional findings.  Imaging: No results found.  Recent Labs: Lab Results  Component Value Date   WBC 3.8 06/28/2023   HGB 12.8 06/28/2023   PLT 269 06/28/2023   NA 139 06/28/2023   K 4.2 06/28/2023   CL 106 06/28/2023   CO2 24 06/28/2023   GLUCOSE 74 06/28/2023   BUN 9 06/28/2023   CREATININE 0.83 06/28/2023   BILITOT 0.7 06/28/2023   ALKPHOS 99 01/25/2021   AST 31 06/28/2023  ALT 32 (H) 06/28/2023   PROT 7.0 06/28/2023   ALBUMIN 4.2 01/25/2021   CALCIUM 9.6 06/28/2023   GFRAA >60 04/23/2019   QFTBGOLDPLUS  NEGATIVE 05/16/2022    Speciality Comments: PLQ eye exam: 03/15/2023 [facility] [place] f/u 12 months  Procedures:  No procedures performed Allergies: Coconut (cocos nucifera), Codeine, Sulfa antibiotics, and Trazodone and nefazodone   Assessment / Plan:     Visit Diagnoses: Rheumatoid arthritis with negative rheumatoid factor, involving unspecified site (HCC) Well-controlled on Rinvoq  and Plaquenil  not having significant recent flares. Recent ophthalmologic evaluation normal. Weight loss may reduce joint pressure and medication needs.  We discussed unclear how much is related to metabolic improvement with medications versus the weight loss itself offloading joints.  If continue to do well in follow-up might be able to discontinue a medicine such as hydroxychloroquine . -Continue Rinvoq  15 mg p.o. daily - Continue hydroxychloroquine  400 mg daily - Continue diclofenac  25 mg twice daily as needed  High risk medication use - Rinvoq  15 mg daily, hydroxychloroquine  200 mg twice daily, diclofenac  25 mg twice daily as needed. PLQ eye exam 05/20/2021 [facility]. Needs updated PLQ eye exam. - Plan: CBC with Differential/Platelet, Comprehensive metabolic panel with GFR, QuantiFERON-TB Gold Plus had previous eye exam which on review no concern for retinal toxicity. - Checking CBC and CMP and annual QuantiFERON screening for medication monitoring on continued long-term use of Rinvoq   Obesity Recent 20-pound weight loss over 1.5-2 months with triceptide. Weight loss expected to alleviate arthritis symptoms and improve insulin resistance. On track with tirzepatide  with dose adjustment as planned.    Orders: Orders Placed This Encounter  Procedures   CBC with Differential/Platelet   Comprehensive metabolic panel with GFR   QuantiFERON-TB Gold Plus   No orders of the defined types were placed in this encounter.    Follow-Up Instructions: Return in about 3 months (around 09/28/2023) for RA on  UPA/HCQ/NSAID f/u 3mos.   Lonni LELON Ester, MD  Note - This record has been created using [employer].  Chart creation errors have been sought, but may not always  have been located. Such creation errors do not reflect on  the standard of medical care.

## 2023-06-29 LAB — CBC WITH DIFFERENTIAL/PLATELET
Absolute Lymphocytes: 1349 {cells}/uL (ref 850–3900)
Absolute Monocytes: 384 {cells}/uL (ref 200–950)
Basophils Absolute: 19 {cells}/uL (ref 0–200)
Basophils Relative: 0.5 %
Eosinophils Absolute: 72 {cells}/uL (ref 15–500)
Eosinophils Relative: 1.9 %
HCT: 39.1 % (ref 35.0–45.0)
Hemoglobin: 12.8 g/dL (ref 11.7–15.5)
MCH: 32 pg (ref 27.0–33.0)
MCHC: 32.7 g/dL (ref 32.0–36.0)
MCV: 97.8 fL (ref 80.0–100.0)
MPV: 9.9 fL (ref 7.5–12.5)
Monocytes Relative: 10.1 %
Neutro Abs: 1976 {cells}/uL (ref 1500–7800)
Neutrophils Relative %: 52 %
Platelets: 269 10*3/uL (ref 140–400)
RBC: 4 10*6/uL (ref 3.80–5.10)
RDW: 12.2 % (ref 11.0–15.0)
Total Lymphocyte: 35.5 %
WBC: 3.8 10*3/uL (ref 3.8–10.8)

## 2023-06-29 LAB — COMPLETE METABOLIC PANEL WITHOUT GFR
AG Ratio: 1.9 (calc) (ref 1.0–2.5)
ALT: 32 U/L — ABNORMAL HIGH (ref 6–29)
AST: 31 U/L (ref 10–35)
Albumin: 4.6 g/dL (ref 3.6–5.1)
Alkaline phosphatase (APISO): 74 U/L (ref 37–153)
BUN: 9 mg/dL (ref 7–25)
CO2: 24 mmol/L (ref 20–32)
Calcium: 9.6 mg/dL (ref 8.6–10.4)
Chloride: 106 mmol/L (ref 98–110)
Creat: 0.83 mg/dL (ref 0.50–1.03)
Globulin: 2.4 g/dL (ref 1.9–3.7)
Glucose, Bld: 74 mg/dL (ref 65–99)
Potassium: 4.2 mmol/L (ref 3.5–5.3)
Sodium: 139 mmol/L (ref 135–146)
Total Bilirubin: 0.7 mg/dL (ref 0.2–1.2)
Total Protein: 7 g/dL (ref 6.1–8.1)

## 2023-06-29 LAB — SEDIMENTATION RATE: Sed Rate: 2 mm/h (ref 0–30)

## 2023-07-06 ENCOUNTER — Other Ambulatory Visit: Payer: Self-pay | Admitting: Physician Assistant

## 2023-07-07 ENCOUNTER — Other Ambulatory Visit: Payer: Self-pay | Admitting: Physician Assistant

## 2023-07-11 ENCOUNTER — Telehealth: Payer: Self-pay | Admitting: Physician Assistant

## 2023-07-11 ENCOUNTER — Other Ambulatory Visit: Payer: Self-pay

## 2023-07-11 ENCOUNTER — Other Ambulatory Visit (HOSPITAL_COMMUNITY): Payer: Self-pay

## 2023-07-11 ENCOUNTER — Other Ambulatory Visit: Payer: Self-pay | Admitting: Family Medicine

## 2023-07-11 MED ORDER — ALPRAZOLAM 1 MG PO TABS
1.0000 mg | ORAL_TABLET | Freq: Every evening | ORAL | 0 refills | Status: DC | PRN
  Filled 2023-07-11: qty 30, 30d supply, fill #0

## 2023-07-11 NOTE — Telephone Encounter (Signed)
 Pt was prescribed Xanax  for flying, but said she is taking at night. She is asking for a script. I pended for Grace Butler to review as appropriate.

## 2023-07-11 NOTE — Telephone Encounter (Signed)
 Pt states she has been taking Alprazolam  and wants to know if she can get a prescription for it. Advised pt I would send msg.

## 2023-07-11 NOTE — Telephone Encounter (Signed)
 Called patient and scheduled her an appointment with docotor Copland for 07/17/2023

## 2023-07-14 ENCOUNTER — Other Ambulatory Visit (HOSPITAL_COMMUNITY): Payer: Self-pay

## 2023-07-14 MED ORDER — AMPHETAMINE-DEXTROAMPHET ER 30 MG PO CP24
30.0000 mg | ORAL_CAPSULE | Freq: Every day | ORAL | 0 refills | Status: DC
  Filled 2023-07-14: qty 30, 30d supply, fill #0

## 2023-07-14 NOTE — Patient Instructions (Incomplete)
 It was good to see you again today  It looks like GI want to get you in for a repeat colonoscopy this year, please give them a call- Dr Charlanne  Address: [address], [place], [place] [address] Phone: [phone]  Try heat, lidocaine patches for your back pain   I will refill your thyroid  med once your TSH comes in

## 2023-07-14 NOTE — Progress Notes (Unsigned)
 [facility] [address] [place], [place] [address] [phone]  Date:  07/17/2023   Name:  Grace Butler   DOB:  1970/10/06   MRN:  [id number]  PCP:  Watt Harlene BROCKS, MD    Chief Complaint: No chief complaint on file.   History of Present Illness:  Grace Butler is a 53 y.o. very pleasant female patient who presents with the following:  Patient seen today for periodic follow-up.  Most recent visit with myself was in March 2024 We got her started on tirzepatide  earlier this year for weight loss History of RA, mood disorder, ADD, hypothyroisidm   Can give second dose of Shingrix  Colon cancer screening due for update Mammogram up-to-date S/p abdominal hysterectomy     Alprazolam , amitriptyline , Adderall , Abilify , gabapentin , Seroquel  Plaquenil , Rinvoq  Maxalt  Levothyroxine  Lisinopril  Omeprazole  Tirzepatide  5 mg  She was seen by her rheumatologist, Dr. Jeannetta in June: seronegative rheumatoid arthritis on Rinvoq  15 mg p.o. daily, hydroxychloroquine  400 mg daily, and diclofenac  25 mg twice daily as needed.  Patient Active Problem List   Diagnosis Date Noted   Acute non-recurrent pansinusitis 03/09/2021   High risk medication use 07/29/2020   Bilateral knee pain 07/29/2020   Attention deficit hyperactivity disorder (ADHD) 09/28/2017   Insomnia, unspecified 09/28/2017   Acute meniscal tear of knee, left, subsequent encounter 02/10/2017   Left knee pain 09/01/2016   Obesity 01/14/2016   Rheumatoid arthritis (HCC) 01/14/2016   Hypothyroidism due to acquired atrophy of thyroid  01/14/2016   Osteopenia 09/15/2015   Bipolar 1 disorder (HCC) 08/21/2015   Gastroesophageal reflux disease without esophagitis 08/21/2015   Migraine 08/21/2015    Past Medical History:  Diagnosis Date   Bipolar 1 disorder (HCC)    Chronic headache    Depression    Gallstone    Hypertension    IBS (irritable bowel syndrome)     Obesity    Rheumatoid arthritis (HCC)    Thyroid  disease    Ulcer     Past Surgical History:  Procedure Laterality Date   ABDOMINAL HYSTERECTOMY     APPENDECTOMY     CHOLECYSTECTOMY     COLONOSCOPY  09/07/2018   CYSTECTOMY     dermoid   ESOPHAGOGASTRODUODENOSCOPY     about 20 years ago to check for ulcers   KNEE ARTHROSCOPY Left 2019   MYOMECTOMY     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 12   UPPER GASTROINTESTINAL ENDOSCOPY      Social History   Tobacco Use   Smoking status: Former    Current packs/day: 0.00    Average packs/day: 0.5 packs/day for 20.0 years (10.0 ttl pk-yrs)    Types: Cigarettes    Start date: 01/16/1994    Quit date: 01/16/2014    Years since quitting: 9.4   Smokeless tobacco: Never  Vaping Use   Vaping status: Some Days   Substances: Nicotine  Substance Use Topics   Alcohol use: Not Currently    Comment: Rarely   Drug use: No    Family History  Problem Relation Age of Onset   Hypertension Mother    Heart attack Father    Healthy Sister    Healthy Sister    Healthy Sister    Healthy Brother    Healthy Son    Rheum arthritis Paternal Uncle    Colon cancer Neg Hx    Esophageal cancer Neg Hx    Colon polyps  Neg Hx    Rectal cancer Neg Hx    Stomach cancer Neg Hx     Allergies  Allergen Reactions   Coconut (Cocos Nucifera) Anaphylaxis    Patient states she is not allergic   Codeine    Sulfa Antibiotics Hives   Trazodone And Nefazodone     Vivid dreams    Medication list has been reviewed and updated.  Current Outpatient Medications on File Prior to Visit  Medication Sig Dispense Refill   ALPRAZolam  (XANAX ) 1 MG tablet Take 1 tablet (1 mg total) by mouth at bedtime as needed for anxiety. 30 tablet 0   amitriptyline  (ELAVIL ) 100 MG tablet Take 1 tablet (100 mg total) by mouth at bedtime. 90 tablet 1   amoxicillin -clavulanate (AUGMENTIN ) 875-125 MG tablet Take 1 tablet by mouth 2 (two) times daily. (Patient not taking: Reported on 06/28/2023)  20 tablet 0   amphetamine -dextroamphetamine  (ADDERALL  XR) 30 MG 24 hr capsule Take 1 capsule (30 mg total) by mouth daily. 30 capsule 0   amphetamine -dextroamphetamine  (ADDERALL  XR) 30 MG 24 hr capsule Take 1 capsule (30 mg total) by mouth daily. 30 capsule 0   amphetamine -dextroamphetamine  (ADDERALL  XR) 30 MG 24 hr capsule Take 1 capsule (30 mg total) by mouth daily. 30 capsule 0   amphetamine -dextroamphetamine  (ADDERALL ) 10 MG tablet Take 1 tablet (10 mg total) by mouth daily with lunch. 30 tablet 0   amphetamine -dextroamphetamine  (ADDERALL ) 10 MG tablet Take 1 tablet (10 mg total) by mouth daily with lunch. 30 tablet 0   amphetamine -dextroamphetamine  (ADDERALL ) 10 MG tablet Take 1 tablet (10 mg total) by mouth daily with lunch. 30 tablet 0   ARIPiprazole  (ABILIFY ) 15 MG tablet TAKE 1 TABLET DAILY 90 tablet 0   Ascorbic Acid (VITAMIN C) 100 MG tablet Take 100 mg by mouth daily.     butalbital -acetaminophen-caffeine  (FIORICET) 50-325-40 MG tablet Take 1-2 tablets by mouth every 6 (six) hours as needed for headache. Max 6 per 24 hours (Patient not taking: Reported on 06/28/2023) 14 tablet 0   calcium citrate-vitamin D  (CITRACAL+D) 315-200 MG-UNIT tablet Take 1 tablet by mouth 2 (two) times daily.     cholecalciferol (VITAMIN D ) 1000 UNITS tablet Take 1,000 Units by mouth daily.     diclofenac  (VOLTAREN ) 25 MG EC tablet Take 1 tablet (25 mg total) by mouth 2 (two) times daily as needed. 180 tablet 1   estradiol (CLIMARA - DOSED IN MG/24 HR) 0.0375 mg/24hr patch Place 0.0375 mg onto the skin once a week.     furosemide  (LASIX ) 20 MG tablet Take 1 tablet (20 mg total) by mouth daily as needed for leg edema (Patient not taking: Reported on 06/28/2023) 30 tablet 3   gabapentin  (NEURONTIN ) 300 MG capsule Take 1 capsule (300 mg total) by mouth at bedtime. 90 capsule 1   hydroxychloroquine  (PLAQUENIL ) 200 MG tablet Take 1 tablet (200 mg total) by mouth 2 (two) times daily. 180 tablet 1   levothyroxine   (SYNTHROID ) 175 MCG tablet Take 1 tablet (175 mcg total) by mouth daily before breakfast. 90 tablet 3   lisinopril  (ZESTRIL ) 10 MG tablet Take 1 tablet (10 mg total) by mouth daily. 90 tablet 3   omeprazole  (PRILOSEC) 40 MG capsule Take 1 capsule (40 mg total) by mouth in the morning and at bedtime. 60 capsule 0   QUEtiapine  (SEROQUEL ) 200 MG tablet Take 1 - 1.5 tablets (200-300 mg total) by mouth at bedtime. 45 tablet 1   RINVOQ  15 MG TB24 TAKE 1 TABLET DAILY  30 tablet 2   rizatriptan  (MAXALT -MLT) 10 MG disintegrating tablet Take 1 tablet (10 mg total) by mouth as needed for migraine. May repeat in 2 hours if needed.  Office visit is needed please (Patient not taking: Reported on 06/28/2023) 10 tablet 0   Sodium Hyaluronate, oral, (HYALURONIC ACID PO) Take by mouth.     Specialty Vitamins Products (MAGNESIUM, AMINO ACID CHELATE,) 133 MG tablet Take 1 tablet by mouth 2 (two) times daily.     tirzepatide  (ZEPBOUND ) 5 MG/0.5ML Pen Inject 5 mg into the skin once a week. 6 mL 2   valACYclovir  (VALTREX ) 1000 MG tablet Take 1 tablet (1,000 mg total) by mouth daily. Take for 5 days for outbreak as needed (Patient not taking: Reported on 06/28/2023) 30 tablet 1   Zoster Vaccine Adjuvanted (SHINGRIX ) injection Inject into the muscle. (Patient not taking: Reported on 06/28/2023) 1 mL 0   [DISCONTINUED] amitriptyline  (ELAVIL ) 50 MG tablet TAKE 1 TABLET EVERY EVENING 90 tablet 0   No current facility-administered medications on file prior to visit.    Review of Systems:  As per HPI- otherwise negative.   Physical Examination: There were no vitals filed for this visit. There were no vitals filed for this visit. There is no height or weight on file to calculate BMI. Ideal Body Weight:    GEN: no acute distress. HEENT: Atraumatic, Normocephalic.  Ears and Nose: No external deformity. CV: RRR, No M/G/R. No JVD. No thrill. No extra heart sounds. PULM: CTA B, no wheezes, crackles, rhonchi. No retractions.  No resp. distress. No accessory muscle use. ABD: S, NT, ND, +BS. No rebound. No HSM. EXTR: No c/c/e PSYCH: Normally interactive. Conversant.    Assessment and Plan: ***  Signed Harlene Schroeder, MD

## 2023-07-17 ENCOUNTER — Ambulatory Visit (INDEPENDENT_AMBULATORY_CARE_PROVIDER_SITE_OTHER): Admitting: Family Medicine

## 2023-07-17 ENCOUNTER — Encounter: Payer: Self-pay | Admitting: Family Medicine

## 2023-07-17 ENCOUNTER — Other Ambulatory Visit (HOSPITAL_COMMUNITY): Payer: Self-pay

## 2023-07-17 DIAGNOSIS — E034 Atrophy of thyroid (acquired): Secondary | ICD-10-CM

## 2023-07-17 DIAGNOSIS — Z13220 Encounter for screening for lipoid disorders: Secondary | ICD-10-CM

## 2023-07-17 DIAGNOSIS — M0600 Rheumatoid arthritis without rheumatoid factor, unspecified site: Secondary | ICD-10-CM

## 2023-07-17 DIAGNOSIS — I10 Essential (primary) hypertension: Secondary | ICD-10-CM

## 2023-07-17 DIAGNOSIS — Z131 Encounter for screening for diabetes mellitus: Secondary | ICD-10-CM

## 2023-07-17 DIAGNOSIS — K219 Gastro-esophageal reflux disease without esophagitis: Secondary | ICD-10-CM

## 2023-07-17 LAB — LIPID PANEL
Cholesterol: 205 mg/dL — ABNORMAL HIGH (ref 0–200)
HDL: 57.3 mg/dL (ref >39.00)
LDL Cholesterol: 131 mg/dL — ABNORMAL HIGH (ref 0–99)
NonHDL: 147.7
Total CHOL/HDL Ratio: 4
Triglycerides: 82 mg/dL (ref 0.0–149.0)
VLDL: 16.4 mg/dL (ref 0.0–40.0)

## 2023-07-17 LAB — TSH: TSH: 0.61 u[IU]/mL (ref 0.35–5.50)

## 2023-07-17 LAB — HEMOGLOBIN A1C: Hgb A1c MFr Bld: 5.6 % (ref 4.6–6.5)

## 2023-07-17 MED ORDER — OMEPRAZOLE 40 MG PO CPDR
40.0000 mg | DELAYED_RELEASE_CAPSULE | Freq: Two times a day (BID) | ORAL | 0 refills | Status: DC
Start: 2023-07-17 — End: 2023-07-17
  Filled 2023-07-17: qty 60, 30d supply, fill #0

## 2023-07-17 MED ORDER — OMEPRAZOLE 40 MG PO CPDR: 40.0000 mg | DELAYED_RELEASE_CAPSULE | Freq: Two times a day (BID) | ORAL | 3 refills | Status: DC

## 2023-07-18 ENCOUNTER — Other Ambulatory Visit (HOSPITAL_COMMUNITY): Payer: Self-pay

## 2023-07-18 ENCOUNTER — Other Ambulatory Visit: Payer: Self-pay | Admitting: Physician Assistant

## 2023-07-18 DIAGNOSIS — G43909 Migraine, unspecified, not intractable, without status migrainosus: Secondary | ICD-10-CM

## 2023-07-19 ENCOUNTER — Other Ambulatory Visit (HOSPITAL_COMMUNITY): Payer: Self-pay

## 2023-07-20 ENCOUNTER — Telehealth: Payer: Self-pay | Admitting: Physician Assistant

## 2023-07-20 ENCOUNTER — Other Ambulatory Visit (HOSPITAL_COMMUNITY): Payer: Self-pay

## 2023-07-20 ENCOUNTER — Telehealth: Payer: Self-pay

## 2023-07-20 DIAGNOSIS — K219 Gastro-esophageal reflux disease without esophagitis: Secondary | ICD-10-CM

## 2023-07-20 DIAGNOSIS — G43909 Migraine, unspecified, not intractable, without status migrainosus: Secondary | ICD-10-CM

## 2023-07-20 MED ORDER — OMEPRAZOLE 40 MG PO CPDR
40.0000 mg | DELAYED_RELEASE_CAPSULE | Freq: Two times a day (BID) | ORAL | 0 refills | Status: DC
  Filled 2023-07-20: qty 60, 30d supply, fill #0

## 2023-07-20 MED ORDER — AMITRIPTYLINE HCL 50 MG PO TABS
50.0000 mg | ORAL_TABLET | Freq: Every evening | ORAL | 0 refills | Status: DC
  Filled 2023-07-20: qty 7, 7d supply, fill #0

## 2023-07-20 NOTE — Telephone Encounter (Signed)
 Sent Rx for 7 days of 50 mg.

## 2023-07-20 NOTE — Telephone Encounter (Signed)
 Copied from CRM [id number]. Topic: Clinical - Prescription Issue >> Jul 20, 2023 10:02 AM Elle L wrote: Reason for CRM: [employer], Sherleen, called on behalf of the patient stating that the patient would not receive her omeprazole  (PRILOSEC) 40 MG capsule in time and she is requesting a prescription be sent to [facility] to bridge the gap.

## 2023-07-20 NOTE — Telephone Encounter (Signed)
 Pt requesting Rx for 7 days Amitriptyline  50 mg 1 every evening to [facility]. Will be week before Rx from [facility] will arrive. Pt stated the 100 mg kept her awake. Stated she advised provider and decided to go back to 50 mg. Contact # pt [phone]

## 2023-07-21 ENCOUNTER — Telehealth: Payer: Self-pay

## 2023-07-21 ENCOUNTER — Other Ambulatory Visit (HOSPITAL_COMMUNITY): Payer: Self-pay

## 2023-07-21 ENCOUNTER — Other Ambulatory Visit: Payer: Self-pay | Admitting: Physician Assistant

## 2023-07-21 DIAGNOSIS — G43909 Migraine, unspecified, not intractable, without status migrainosus: Secondary | ICD-10-CM

## 2023-07-21 NOTE — Telephone Encounter (Signed)
 Pharmacy Patient Advocate Encounter  Received notification from [employer] that Prior Authorization for  Omeprazole  40MG  dr capsules  has been APPROVED from 07/21/23 to 07/20/24. Ran test claim, Copay is $20. This test claim was processed through [facility]- copay amounts may vary at other pharmacies due to pharmacy/plan contracts, or as the patient moves through the different stages of their insurance plan.   PA #/Case ID/Reference #: [id number]

## 2023-07-21 NOTE — Telephone Encounter (Signed)
 Pharmacy Patient Advocate Encounter   Received notification from Onbase that prior authorization for Omeprazole  40MG  dr capsules is required/requested.   Insurance verification completed.   The patient is insured through [employer] .   Per test claim: PA required; PA submitted to above mentioned insurance via CoverMyMeds Key/confirmation #/EOC [id number] Status is pending

## 2023-07-22 ENCOUNTER — Other Ambulatory Visit (HOSPITAL_COMMUNITY): Payer: Self-pay

## 2023-07-25 ENCOUNTER — Encounter: Payer: Self-pay | Admitting: Family Medicine

## 2023-07-26 ENCOUNTER — Other Ambulatory Visit (HOSPITAL_COMMUNITY): Payer: Self-pay

## 2023-07-26 ENCOUNTER — Encounter: Payer: Self-pay | Admitting: Family Medicine

## 2023-07-31 ENCOUNTER — Telehealth: Payer: Self-pay | Admitting: Physician Assistant

## 2023-07-31 ENCOUNTER — Other Ambulatory Visit: Payer: Self-pay

## 2023-07-31 ENCOUNTER — Other Ambulatory Visit (HOSPITAL_COMMUNITY): Payer: Self-pay

## 2023-07-31 DIAGNOSIS — G43909 Migraine, unspecified, not intractable, without status migrainosus: Secondary | ICD-10-CM

## 2023-07-31 MED ORDER — AMITRIPTYLINE HCL 50 MG PO TABS
50.0000 mg | ORAL_TABLET | Freq: Every evening | ORAL | 0 refills | Status: DC
  Filled 2023-07-31: qty 7, 7d supply, fill #0

## 2023-07-31 MED ORDER — AMITRIPTYLINE HCL 50 MG PO TABS: 50.0000 mg | ORAL_TABLET | Freq: Every evening | ORAL | 1 refills | Status: DC

## 2023-07-31 NOTE — Telephone Encounter (Signed)
 Next visit is 09/26/22. Grace Butler requested a refill on her 50 mg Amitriptyline . She has been out of it for several days. She called [facility] and they informed her it was cancelled. Is she able to get the Amitriptyline ? Pharmacy is:  [facility] - [place], [place] - [address]   Phone: [phone]  Fax: [phone]

## 2023-07-31 NOTE — Addendum Note (Signed)
 Addended by: CON BRISTLE T on: 07/31/2023 04:32 PM   Modules accepted: Orders

## 2023-08-01 ENCOUNTER — Other Ambulatory Visit (HOSPITAL_COMMUNITY): Payer: Self-pay

## 2023-08-02 ENCOUNTER — Other Ambulatory Visit: Payer: Self-pay | Admitting: Physician Assistant

## 2023-08-02 DIAGNOSIS — G43909 Migraine, unspecified, not intractable, without status migrainosus: Secondary | ICD-10-CM

## 2023-08-07 ENCOUNTER — Other Ambulatory Visit (HOSPITAL_COMMUNITY): Payer: Self-pay

## 2023-08-07 ENCOUNTER — Other Ambulatory Visit: Payer: Self-pay

## 2023-08-07 ENCOUNTER — Other Ambulatory Visit: Payer: Self-pay | Admitting: Physician Assistant

## 2023-08-07 MED ORDER — QUETIAPINE FUMARATE 200 MG PO TABS
200.0000 mg | ORAL_TABLET | Freq: Every day | ORAL | 1 refills | Status: DC
  Filled 2023-08-07: qty 45, 30d supply, fill #0

## 2023-08-07 MED ORDER — ESTRADIOL 0.0375 MG/24HR TD PTWK
0.0375 mg | MEDICATED_PATCH | TRANSDERMAL | 3 refills | Status: DC
  Filled 2023-08-07: qty 16, 112d supply, fill #0
  Filled 2023-08-07: qty 12, 84d supply, fill #0
  Filled 2023-08-17: qty 12, 84d supply, fill #1
  Filled 2023-09-29: qty 4, 28d supply, fill #1
  Filled 2023-10-06 – 2023-10-15 (×3): qty 12, 84d supply, fill #1

## 2023-08-11 ENCOUNTER — Other Ambulatory Visit (HOSPITAL_COMMUNITY): Payer: Self-pay

## 2023-08-14 ENCOUNTER — Other Ambulatory Visit (HOSPITAL_COMMUNITY): Payer: Self-pay

## 2023-08-14 ENCOUNTER — Other Ambulatory Visit: Payer: Self-pay | Admitting: Physician Assistant

## 2023-08-14 MED ORDER — AMPHETAMINE-DEXTROAMPHET ER 30 MG PO CP24
30.0000 mg | ORAL_CAPSULE | Freq: Every day | ORAL | 0 refills | Status: DC
  Filled 2023-08-14: qty 30, 30d supply, fill #0

## 2023-08-17 ENCOUNTER — Other Ambulatory Visit: Payer: Self-pay

## 2023-08-17 ENCOUNTER — Other Ambulatory Visit (HOSPITAL_COMMUNITY): Payer: Self-pay

## 2023-08-18 ENCOUNTER — Other Ambulatory Visit (HOSPITAL_COMMUNITY): Payer: Self-pay

## 2023-08-19 ENCOUNTER — Other Ambulatory Visit: Payer: Self-pay | Admitting: Family Medicine

## 2023-08-19 DIAGNOSIS — K219 Gastro-esophageal reflux disease without esophagitis: Secondary | ICD-10-CM

## 2023-08-21 ENCOUNTER — Other Ambulatory Visit (HOSPITAL_COMMUNITY): Payer: Self-pay

## 2023-08-21 MED ORDER — OMEPRAZOLE 40 MG PO CPDR
40.0000 mg | DELAYED_RELEASE_CAPSULE | Freq: Two times a day (BID) | ORAL | 0 refills | Status: AC
  Filled 2023-08-21 – 2023-09-04 (×13): qty 60, 30d supply, fill #0

## 2023-08-22 ENCOUNTER — Other Ambulatory Visit (HOSPITAL_COMMUNITY): Payer: Self-pay

## 2023-08-23 ENCOUNTER — Other Ambulatory Visit (HOSPITAL_COMMUNITY): Payer: Self-pay

## 2023-08-24 ENCOUNTER — Other Ambulatory Visit (HOSPITAL_COMMUNITY): Payer: Self-pay

## 2023-08-25 ENCOUNTER — Other Ambulatory Visit (HOSPITAL_COMMUNITY): Payer: Self-pay

## 2023-08-26 ENCOUNTER — Other Ambulatory Visit (HOSPITAL_COMMUNITY): Payer: Self-pay

## 2023-08-28 ENCOUNTER — Other Ambulatory Visit (HOSPITAL_COMMUNITY): Payer: Self-pay

## 2023-08-29 ENCOUNTER — Other Ambulatory Visit (HOSPITAL_COMMUNITY): Payer: Self-pay

## 2023-08-30 ENCOUNTER — Other Ambulatory Visit (HOSPITAL_COMMUNITY): Payer: Self-pay

## 2023-08-31 ENCOUNTER — Other Ambulatory Visit (HOSPITAL_COMMUNITY): Payer: Self-pay

## 2023-09-01 ENCOUNTER — Other Ambulatory Visit (HOSPITAL_COMMUNITY): Payer: Self-pay

## 2023-09-02 ENCOUNTER — Other Ambulatory Visit (HOSPITAL_COMMUNITY): Payer: Self-pay

## 2023-09-04 ENCOUNTER — Other Ambulatory Visit: Payer: Self-pay

## 2023-09-04 ENCOUNTER — Other Ambulatory Visit (HOSPITAL_COMMUNITY): Payer: Self-pay

## 2023-09-06 ENCOUNTER — Other Ambulatory Visit: Payer: Self-pay | Admitting: Physician Assistant

## 2023-09-06 ENCOUNTER — Other Ambulatory Visit (HOSPITAL_COMMUNITY): Payer: Self-pay

## 2023-09-06 DIAGNOSIS — F411 Generalized anxiety disorder: Secondary | ICD-10-CM

## 2023-09-07 ENCOUNTER — Other Ambulatory Visit (HOSPITAL_COMMUNITY): Payer: Self-pay

## 2023-09-07 MED ORDER — ALPRAZOLAM 1 MG PO TABS
1.0000 mg | ORAL_TABLET | Freq: Every evening | ORAL | 0 refills | Status: DC | PRN
  Filled 2023-09-07: qty 30, 30d supply, fill #0

## 2023-09-08 ENCOUNTER — Other Ambulatory Visit (HOSPITAL_COMMUNITY): Payer: Self-pay

## 2023-09-11 ENCOUNTER — Other Ambulatory Visit: Payer: Self-pay

## 2023-09-11 ENCOUNTER — Telehealth: Payer: Self-pay | Admitting: Physician Assistant

## 2023-09-11 NOTE — Telephone Encounter (Signed)
 LF 8/11, due 9/9

## 2023-09-11 NOTE — Telephone Encounter (Signed)
 Pt called asking for a refill on her adderallxr 30 mg. Pharmacy is [facility]. Next appt 09/26

## 2023-09-12 ENCOUNTER — Other Ambulatory Visit: Payer: Self-pay

## 2023-09-12 ENCOUNTER — Other Ambulatory Visit (HOSPITAL_COMMUNITY): Payer: Self-pay

## 2023-09-12 DIAGNOSIS — F902 Attention-deficit hyperactivity disorder, combined type: Secondary | ICD-10-CM

## 2023-09-12 MED ORDER — AMPHETAMINE-DEXTROAMPHET ER 30 MG PO CP24
30.0000 mg | ORAL_CAPSULE | Freq: Every day | ORAL | 0 refills | Status: DC
  Filled 2023-09-12: qty 30, 30d supply, fill #0

## 2023-09-12 NOTE — Telephone Encounter (Signed)
 Pended

## 2023-09-14 ENCOUNTER — Other Ambulatory Visit: Payer: Self-pay | Admitting: Physician Assistant

## 2023-09-14 ENCOUNTER — Other Ambulatory Visit: Payer: Self-pay | Admitting: Internal Medicine

## 2023-09-14 DIAGNOSIS — M0600 Rheumatoid arthritis without rheumatoid factor, unspecified site: Secondary | ICD-10-CM

## 2023-09-14 NOTE — Telephone Encounter (Signed)
 Last Fill: 03/28/2023  Labs: 06/28/2023 ALT 32  Next Visit: 10/02/2023  Last Visit: 06/28/2023  DX: Rheumatoid arthritis with negative rheumatoid factor, involving unspecified site [facility])   Current Dose per office note 06/28/2023: diclofenac  25 mg twice daily as needed   Okay to refill Diclofenac ?

## 2023-09-18 NOTE — Progress Notes (Deleted)
 Office Visit Note  Patient: Grace Butler             Date of Birth: 09-29-1970           MRN: [id number]             PCP: Watt Harlene BROCKS, MD Referring: Watt Harlene BROCKS, MD Visit Date: 10/02/2023   Subjective:  No chief complaint on file.   History of Present Illness: Grace Butler is a 53 y.o. female here for follow up for seronegative rheumatoid arthritis on Rinvoq  15 mg p.o. daily, hydroxychloroquine  400 mg daily, and diclofenac  25 mg twice daily as needed.   Previous HPI 06/28/2023 Grace Butler is a 53 year old female who presents for a follow-up for seronegative rheumatoid arthritis on Rinvoq  15 mg p.o. daily, hydroxychloroquine  400 mg daily, and diclofenac  25 mg twice daily as needed.   She has lost 20 pounds over the past two months, which she attributes to the use of tirzepatide  . She started the medication approximately one and a half to two months ago, beginning at a dose of 2.5 mg and increasing to 5 mg, with plans to increase to 7 or 7.5 mg next month. She experiences nausea for about a week after each dose increase, but it resolves thereafter. She also reports diarrhea but no constipation.   There is a significant improvement in her knee pain, and she has not experienced any arthritis flare-ups recently, despite a stressful work environment. She is currently taking Rinvoq  and Plaquenil , and her recent eye exam was normal.   In terms of her social history, she works in Sales promotion account executive and describes challenges with hiring and staffing at her workplace. She mentions a reduction in sugar intake, noting that she no longer craves sweets as she did before starting the medication.         Previous HPI 03/28/23 Grace Butler is a 53 y.o. female here for follow up for seropositive RA on Rinvoq  15 mg daily, hydroxychloroquine  400 mg daily, and diclofenac  25 mg twice daily as needed.     She is currently taking Rinvoq , Plaquenil , and diclofenac  for arthritis. She has not  experienced any major flare-ups recently and describes her condition as 'pretty good' despite personal stressors. Morning stiffness lasts about five to ten minutes and is 'noticeable but not super long.'   Menopausal symptoms, including flushing, sweating, and night sweats, have improved with hormone therapy. She is relieved that these symptoms are 'gone' and appreciates a 'great night's sleep.'   She experiences fluid retention in her ankles, particularly after prolonged sitting. Pain occurs during leg massages, described as 'hurts' when touched, and is associated with her varicose veins. The pain is localized to her calves and shins. No significant swelling or fluid retention beyond what is described.   She had a sinus infection about a month ago, treated with a steroid taper and Augmentin , which resolved the infection. She has not received a flu shot this season but has otherwise avoided illness.         Previous HPI 08/31/2022 Grace Butler is a 53 y.o. female here for follow up for seropositive RA on Rinvoq  15 mg daily, hydroxychloroquine  400 mg daily, and diclofenac  25 mg twice daily as needed.  Overall hip symptoms have been well-controlled without any major exacerbation and no significant illness.  Earlier this month she did experience an increase in low back pain also a popping sensation of the right shoulder with some pain  mostly just from certain arm movements.  This was all during repetitive lifting activities associate with a move.  She took muscle relaxer and 5-day course of prednisone  20 mg symptoms are improved.     Previous HPI 05/16/2022 Grace Butler is a 53 y.o. female here for follow up for seropositive RA on Rinvoq  15 mg daily hydroxychloroquine  400 mg daily and diclofenac  25 mg twice daily as needed.  Somewhat prolonged time since her last follow-up as she has had many major life events going on including change of work and marriage separation.  Without significant flareup  over the winter or throughout this time.  Has not had any serious interval infections or complications.  No new falls or injuries.   Previous HPI 08/30/21 Grace Butler is a 53 y.o. female here for follow up for seronegative RA on Rinvoq  15 mg daily hydroxychloroquine  400 mg daily and diclofenac  25 mg as needed.  She has not had any major flareups or exacerbations since her last visit or required any steroid treatment.  No new infections or antibiotic treatment required.  This morning at work she fell describes as tripping over her own foot and landed incurring some abrasion on her right elbow as well as lateral right knee and right hip.   Previous HPI 05/26/2021 Grace Butler is a 53 y.o. female here for follow up  for seronegative RA on Rinvoq  15mg  po qd, HCQ 400 mg PO daily, and diclofenac  25 mg PRN. She is doing well overall since last visit no major flare ups. No infections or antibiotic treatments needed. She does not have many bad days recently. She had lab testing drawn on 05/06/21 with her family medicine office including CBC, CMP, and TSH which were normal. Her wife was newly diagnosed with colorectal cancer and undergoing neoadjuvant chemoradiation therapy so she has increased stress and missed work time due to that.   Previous HPI 01/19/2021 Grace Butler is a 53 y.o. female here for follow up for seronegative RA on Enbrel  50 mg [place] weekly, HCQ 400 mg PO daily, and diclofenac  25 mg PRN. She has been doing well most of the time without a RA flare up but for the last few weeks having increased stiffness in bilateral legs, also pain but less of a problem than the stiffness. She fell twice without major injury. She has an appointment to start working with a Systems analyst on Thursday plans to do resistance training for strengthening and ideally some weight loss. She is interested in information about Rinvoq  after hearing about the medication.   Previous HPI 10/20/20 Grace Butler is a 53 y.o.  female here for follow up for seronegative rheumatoid arthritis after restarting treatment with Enbrel  50 mg [place] weekly with continued HCQ 400 mg PO daily. Workup at that visit with negative inflammatory serology, xray of left knee does show osteoarthritis changes. She had been feeling pretty well after starting Enbrel  although continuing to take diclofenac  25 mg BID regularly. However in the past few weeks she is feeling worse, with several major life and family stressors going on. 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 Her joint pain is worse especially knees and ankles. Not especially stiff though. Recent fall onto right elbow and right knee without major injury. Had right knee injection with orthopedic surgery for pain  and swelling there, with an improvement.   Previous HPI 07/29/20 Grace Butler is a 53 y.o. female here for transition of care for seronegative rheumatoid arthritis previously managed with [facility] rheumatology.  Rheumatoid arthritis was diagnosed in 2013 with development of bilateral hand and feet swelling and morning stiffness.  Previous treatments include methotrexate stopped due to elevated transaminases.  She was treated with Humira for about 6 months but with increasing injection site reaction and pain did not tolerate this well.  She was switched to Enbrel  continued this for several months but also stopped due to the same problem of local injection site reactions and pain.  She is not sure whether the medicine was ever effective due to early discontinuation.  She started [place] in 2016 she felt greatly improved her symptoms continue this medicine until 2021 she does question whether there was loss of efficacy.  After Earma she started hydroxychloroquine  has been on this since October 2021 does not feel this  medication has been highly effective for her joint pain.  Currently she complains of daily pain worst in her bilateral knees and feet.  She describes the pain as sharp but states her foot pain feels more like a pressure sensation.  The knee pain is exacerbated climbing stairs.  She notices stiffness pain and redness over her MCP joints does not describe discrete swelling in this area lately.  She is not sure whether there is knee or ankle swelling related to her arthritis body habitus and swelling and redness she gets in the feet from standing during the day.  Currently she takes Celebrex once daily in the mornings and takes Tylenol usually twice a day for her ongoing joint pain.  She previously took oral diclofenac  that she found helpful but was switched due to concern of long-term NSAID exposure.  She takes hydroxychloroquine  400 mg/day.   DMARD Hx MTX d/c LFTs Humira d/c injection site reactions Enbrel  d/c injection site reactions (old formulation) Earma ?loss of efficacy   Labs  02/2014 HBV/HCV neg RF neg CCP neg     No Rheumatology ROS completed.   PMFS History:  Patient Active Problem List   Diagnosis Date Noted   High risk medication use 07/29/2020   Attention deficit hyperactivity disorder (ADHD) 09/28/2017   Insomnia, unspecified 09/28/2017   Acute meniscal tear of knee, left, subsequent encounter 02/10/2017   Left knee pain 09/01/2016   Obesity 01/14/2016   Rheumatoid arthritis (HCC) 01/14/2016   Hypothyroidism due to acquired atrophy of thyroid  01/14/2016   Osteopenia 09/15/2015   Bipolar 1 disorder (HCC) 08/21/2015   Gastroesophageal reflux disease without esophagitis 08/21/2015   Migraine 08/21/2015    Past Medical History:  Diagnosis Date   Bipolar 1 disorder (HCC)    Chronic headache    Depression    Gallstone    Hypertension    IBS (irritable bowel syndrome)    Obesity    Rheumatoid arthritis (HCC)    Thyroid  disease    Ulcer     Family History   Problem Relation Age of Onset   Hypertension Mother    Heart attack Father    Healthy Sister    Healthy Sister    Healthy Sister    Healthy Brother    Healthy Son    Rheum arthritis Paternal Uncle    Colon cancer Neg Hx    Esophageal cancer Neg Hx    Colon polyps Neg Hx    Rectal cancer Neg Hx  Stomach cancer Neg Hx    Past Surgical History:  Procedure Laterality Date   ABDOMINAL HYSTERECTOMY     APPENDECTOMY     CHOLECYSTECTOMY     COLONOSCOPY  09/07/2018   CYSTECTOMY     dermoid   ESOPHAGOGASTRODUODENOSCOPY     about 20 years ago to check for ulcers   KNEE ARTHROSCOPY Left 2019   MYOMECTOMY     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 32   UPPER GASTROINTESTINAL ENDOSCOPY     Social History   Social History Narrative   Not on file   Immunization History  Administered Date(s) Administered   Influenza,inj,Quad PF,6+ Mos 01/14/2016, 09/13/2017   Influenza-Unspecified 01/14/2016, 10/31/2016, 09/13/2017, 12/17/2017, 08/06/2018   Pneumococcal Conjugate-13 03/24/2014, 05/26/2014   Pneumococcal Polysaccharide-23 05/26/2014   Pneumococcal-Unspecified 05/26/2014   Tdap 11/10/2017   Zoster Recombinant(Shingrix ) 01/31/2022     Objective: Vital Signs: There were no vitals taken for this visit.   Physical Exam   Musculoskeletal Exam: ***  CDAI Exam: CDAI Score: -- Patient Global: --; Provider Global: -- Swollen: --; Tender: -- Joint Exam 10/02/2023   No joint exam has been documented for this visit   There is currently no information documented on the homunculus. Go to the Rheumatology activity and complete the homunculus joint exam.  Investigation: No additional findings.  Imaging: No results found.  Recent Labs: Lab Results  Component Value Date   WBC 3.8 06/28/2023   HGB 12.8 06/28/2023   PLT 269 06/28/2023   NA 139 06/28/2023   K 4.2 06/28/2023   CL 106 06/28/2023   CO2 24 06/28/2023   GLUCOSE 74 06/28/2023   BUN 9 06/28/2023   CREATININE 0.83 06/28/2023    BILITOT 0.7 06/28/2023   ALKPHOS 99 01/25/2021   AST 31 06/28/2023   ALT 32 (H) 06/28/2023   PROT 7.0 06/28/2023   ALBUMIN 4.2 01/25/2021   CALCIUM 9.6 06/28/2023   GFRAA >60 04/23/2019   QFTBGOLDPLUS NEGATIVE 05/16/2022    Speciality Comments: PLQ eye exam: 03/15/2023 [facility] [place] f/u 12 months  Procedures:  No procedures performed Allergies: Codeine, Sulfa antibiotics, and Trazodone and nefazodone   Assessment / Plan:     Visit Diagnoses: No diagnosis found.  ***  Orders: No orders of the defined types were placed in this encounter.  No orders of the defined types were placed in this encounter.    Follow-Up Instructions: No follow-ups on file.   Demaryius Imran M Caiya Bettes, CMA  Note - This record has been created using Animal nutritionist.  Chart creation errors have been sought, but may not always  have been located. Such creation errors do not reflect on  the standard of medical care.

## 2023-09-20 ENCOUNTER — Other Ambulatory Visit: Payer: Self-pay | Admitting: Family Medicine

## 2023-09-20 DIAGNOSIS — E034 Atrophy of thyroid (acquired): Secondary | ICD-10-CM

## 2023-09-25 ENCOUNTER — Other Ambulatory Visit: Payer: Self-pay | Admitting: Internal Medicine

## 2023-09-25 DIAGNOSIS — M0600 Rheumatoid arthritis without rheumatoid factor, unspecified site: Secondary | ICD-10-CM

## 2023-09-25 NOTE — Telephone Encounter (Signed)
 Last Fill: 06/27/2023  Labs: 06/28/2023 ALT 32  TB Gold: 05/16/2022 Neg     Next Visit: 10/02/2023  Last Visit: 06/28/2023  IK:Myzlfjunpi arthritis with negative rheumatoid factor, involving unspecified site   Current Dose per office note 06/28/2023: Rinvoq  15 mg p.o. daily   Patient to update labs at upcoming appointment on 10/02/2023  Okay to refill Rinvoq ?

## 2023-09-26 ENCOUNTER — Telehealth: Admitting: Physician Assistant

## 2023-09-26 ENCOUNTER — Other Ambulatory Visit: Payer: Self-pay | Admitting: Physician Assistant

## 2023-09-26 ENCOUNTER — Encounter: Payer: Self-pay | Admitting: Physician Assistant

## 2023-09-26 DIAGNOSIS — F411 Generalized anxiety disorder: Secondary | ICD-10-CM | POA: Diagnosis not present

## 2023-09-26 DIAGNOSIS — F902 Attention-deficit hyperactivity disorder, combined type: Secondary | ICD-10-CM

## 2023-09-26 DIAGNOSIS — F319 Bipolar disorder, unspecified: Secondary | ICD-10-CM | POA: Diagnosis not present

## 2023-09-26 DIAGNOSIS — G4700 Insomnia, unspecified: Secondary | ICD-10-CM | POA: Diagnosis not present

## 2023-09-26 MED ORDER — QUETIAPINE FUMARATE 200 MG PO TABS: 200.0000 mg | ORAL_TABLET | Freq: Every day | ORAL | 1 refills | Status: DC

## 2023-09-26 NOTE — Progress Notes (Deleted)
 [facility]  Patient ID: Grace Butler,  MRN: [id number]  PCP: Watt Harlene BROCKS, MD  Date of Evaluation: 09/26/2023 Time spent:20 minutes  Chief Complaint:  Chief Complaint   ADHD; Insomnia; Anxiety; Depression; Follow-up    Virtual Visit via Telehealth  I connected with patient by a video enabled telemedicine application with their informed consent, and verified patient privacy and that I am speaking with the correct person using two identifiers.  I am private, in my office and the patient is at work.  I discussed the limitations, risks, security and privacy concerns of performing an evaluation and management service by video and the availability of in person appointments. I also discussed with the patient that there may be a patient responsible charge related to this service. The patient expressed understanding and agreed to proceed.   I discussed the assessment and treatment plan with the patient. The patient was provided an opportunity to ask questions and all were answered. The patient agreed with the plan and demonstrated an understanding of the instructions.   The patient was advised to call back or seek an in-person evaluation if the symptoms worsen or if the condition fails to improve as anticipated.  I provided 20 minutes of non-face-to-face time during this encounter.  HISTORY/CURRENT STATUS: HPI for 26-month med check.  Grace Butler is doing well with current meds.  Able to enjoy things.  Is active in her church.  Energy and motivation are good.  Work is going well.   No extreme sadness, tearfulness, or feelings of hopelessness.  Sleeps well with the Seroquel .  Also takes 1/2 Xanax   in the evening to get her mind to quieten down so she can fall asleep. Doesn't take the Xanax  close to the time she takes Adderall .  ADLs and personal hygiene are normal.   States that attention is good without easy distractibility.  Able to focus on things and finish tasks to completion.  She's purposely losing weight.   No SI/HI.  No reports of increased energy with decreased need for sleep, increased talkativeness, impulsivity or risky behaviors, increased spending, grandiosity, increased irritability or anger, paranoia, or hallucinations.  Individual Medical History/ Review of Systems: Changes? :No      Past medications for mental health diagnoses include: Adderall  XR, Seroquel , Abilify , trazodone, Serzone, Elavil , Xanax , Ambien caused vivid nightmares  Allergies: Codeine, Sulfa antibiotics, and Trazodone and nefazodone  Current Medications:  Current Outpatient Medications:    ALPRAZolam  (XANAX ) 1 MG tablet, Take 1 tablet (1 mg total) by mouth at bedtime as needed for anxiety., Disp: 30 tablet, Rfl: 0   amitriptyline  (ELAVIL ) 50 MG tablet, Take 1 tablet (50 mg total) by mouth every evening., Disp: 7 tablet, Rfl: 0   amphetamine -dextroamphetamine  (ADDERALL  XR) 30 MG 24 hr capsule, Take 1 capsule (30 mg total) by mouth daily., Disp: 30 capsule, Rfl: 0   amphetamine -dextroamphetamine  (ADDERALL  XR) 30 MG 24 hr capsule, Take 1 capsule (30 mg total) by mouth daily., Disp: 30 capsule, Rfl: 0   amphetamine -dextroamphetamine  (ADDERALL  XR) 30 MG 24 hr capsule, Take 1 capsule (30 mg total) by mouth daily., Disp: 30 capsule, Rfl: 0   amphetamine -dextroamphetamine  (ADDERALL ) 10 MG tablet, Take 1 tablet (10 mg total) by mouth daily with lunch., Disp: 30 tablet, Rfl: 0   amphetamine -dextroamphetamine  (ADDERALL ) 10 MG tablet, Take 1 tablet (10 mg total) by mouth daily with lunch., Disp: 30 tablet, Rfl: 0   amphetamine -dextroamphetamine  (ADDERALL ) 10 MG tablet, Take 1 tablet (10 mg total) by mouth daily  with lunch., Disp: 30 tablet, Rfl: 0   ARIPiprazole  (ABILIFY ) 15 MG tablet, TAKE 1 TABLET DAILY, Disp: 90 tablet, Rfl: 0   Ascorbic Acid (VITAMIN C) 100 MG tablet, Take 100 mg by mouth daily., Disp: , Rfl:    butalbital -acetaminophen-caffeine  (FIORICET) 50-325-40 MG tablet, Take 1-2 tablets  by mouth every 6 (six) hours as needed for headache. Max 6 per 24 hours, Disp: 14 tablet, Rfl: 0   calcium citrate-vitamin D  (CITRACAL+D) 315-200 MG-UNIT tablet, Take 1 tablet by mouth 2 (two) times daily., Disp: , Rfl:    cholecalciferol (VITAMIN D ) 1000 UNITS tablet, Take 1,000 Units by mouth daily., Disp: , Rfl:    diclofenac  (VOLTAREN ) 25 MG EC tablet, TAKE 1 TABLET TWICE A DAY AS NEEDED, Disp: 180 tablet, Rfl: 0   estradiol  (CLIMARA  - DOSED IN MG/24 HR) 0.0375 mg/24hr patch, Place 0.0375 mg onto the skin once a week., Disp: , Rfl:    estradiol  (CLIMARA ) 0.0375 mg/24hr patch, Place 1 patch (0.0375 mg total) onto the skin once a week., Disp: 12 patch, Rfl: 3   furosemide  (LASIX ) 20 MG tablet, Take 1 tablet (20 mg total) by mouth daily as needed for leg edema, Disp: 30 tablet, Rfl: 3   gabapentin  (NEURONTIN ) 300 MG capsule, Take 1 capsule (300 mg total) by mouth at bedtime., Disp: 90 capsule, Rfl: 1   hydroxychloroquine  (PLAQUENIL ) 200 MG tablet, Take 1 tablet (200 mg total) by mouth 2 (two) times daily., Disp: 180 tablet, Rfl: 1   levothyroxine  (SYNTHROID ) 175 MCG tablet, TAKE 1 TABLET DAILY BEFORE BREAKFAST, Disp: 90 tablet, Rfl: 3   lisinopril  (ZESTRIL ) 10 MG tablet, Take 1 tablet (10 mg total) by mouth daily., Disp: 90 tablet, Rfl: 3   omeprazole  (PRILOSEC) 40 MG capsule, Take 1 capsule (40 mg total) by mouth in the morning and at bedtime., Disp: 60 capsule, Rfl: 0   QUEtiapine  (SEROQUEL ) 50 MG tablet, Take 50 mg by mouth at bedtime., Disp: , Rfl:    RINVOQ  15 MG TB24, TAKE 1 TABLET DAILY, Disp: 30 tablet, Rfl: 0   rizatriptan  (MAXALT -MLT) 10 MG disintegrating tablet, Take 1 tablet (10 mg total) by mouth as needed for migraine. May repeat in 2 hours if needed.  Office visit is needed please, Disp: 10 tablet, Rfl: 0   Sodium Hyaluronate, oral, (HYALURONIC ACID PO), Take by mouth., Disp: , Rfl:    Specialty Vitamins Products (MAGNESIUM, AMINO ACID CHELATE,) 133 MG tablet, Take 1 tablet by mouth 2  (two) times daily., Disp: , Rfl:    tirzepatide  (ZEPBOUND ) 5 MG/0.5ML Pen, Inject 5 mg into the skin once a week., Disp: 6 mL, Rfl: 2   valACYclovir  (VALTREX ) 1000 MG tablet, Take 1 tablet (1,000 mg total) by mouth daily. Take for 5 days for outbreak as needed, Disp: 30 tablet, Rfl: 1   QUEtiapine  (SEROQUEL ) 200 MG tablet, Take 1 tablet (200 mg total) by mouth at bedtime., Disp: 90 tablet, Rfl: 1   Zoster Vaccine Adjuvanted (SHINGRIX ) injection, Inject into the muscle., Disp: 1 mL, Rfl: 0 Medication Side Effects: none  Family Medical/ Social History: Changes?  See HPI  MENTAL HEALTH EXAM:  There were no vitals taken for this visit.There is no height or weight on file to calculate BMI.  General Appearance: Casual, Well Groomed, and Obese  Eye Contact:  Good  Speech:  Clear and Coherent and Normal Rate  Volume:  Normal  Mood:  Euthymic  Affect:  Congruent  Thought Process:  Goal Directed and Descriptions of Associations:  Intact  Orientation:  Full (Time, Place, and Person)  Thought Content: Logical   Suicidal Thoughts:  No  Homicidal Thoughts:  No  Memory:  WNL  Judgement:  Good  Insight:  Good  Psychomotor Activity:  Normal  Concentration:  Concentration: Good and Attention Span: Good  Recall:  Good  Fund of Knowledge: Good  Language: Good  Assets:  Communication Skills Desire for Improvement Financial Resources/Insurance Housing Transportation Vocational/Educational  ADL's:  Intact  Cognition: WNL  Prognosis:  Good    PCP follows labs  DIAGNOSES:    ICD-10-CM   1. Attention deficit hyperactivity disorder (ADHD), combined type  F90.2     2. Generalized anxiety disorder  F41.1     3. Bipolar I disorder (HCC)  F31.9     4. Insomnia, unspecified type  G47.00      Receiving Psychotherapy: No   RECOMMENDATIONS:  PDMP was reviewed.  Adderall  09/12/2023.  Xanax  filled 09/07/2023. I provided approximately 20  minutes of non-face-to-face time during this encounter,  including time spent before and after the visit in records review, medical decision making, counseling pertinent to today's visit, and charting.   She's doing well so no changes need to be made.   Continue Xanax  1 mg before flying prn. Continue amitriptyline  to 100 mg p.o. nightly for migraine prevention as well as insomnia.   Continue Adderall  XR 30 mg, 1 q am. Continue Adderall  10 mg, 1 at lunch.  Continue Abilify  15 mg, 1 p.o. daily. Continue gabapentin  300 mg, 1 p.o. nightly. Continue Seroquel  200 mg + 50 mg at bedtime.  Return in 6 months.  Verneita Cooks, PA-C

## 2023-09-26 NOTE — Progress Notes (Deleted)
 [facility]  Patient ID: Grace Butler,  MRN: [id number]  PCP: Grace Harlene BROCKS, MD  Date of Evaluation: 09/26/2023 Time spent:20 minutes  Chief Complaint:  Chief Complaint   ADHD; Insomnia; Anxiety; Depression; Follow-up    Virtual Visit via Telehealth  I connected with patient by a video enabled telemedicine application with their informed consent, and verified patient privacy and that I am speaking with the correct person using two identifiers.  I am private, in my office and the patient is at work.  I discussed the limitations, risks, security and privacy concerns of performing an evaluation and management service by video and the availability of in person appointments. I also discussed with the patient that there may be a patient responsible charge related to this service. The patient expressed understanding and agreed to proceed.   I discussed the assessment and treatment plan with the patient. The patient was provided an opportunity to ask questions and all were answered. The patient agreed with the plan and demonstrated an understanding of the instructions.   The patient was advised to call back or seek an in-person evaluation if the symptoms worsen or if the condition fails to improve as anticipated.  I provided 20 minutes of non-face-to-face time during this encounter.  HISTORY/CURRENT STATUS: HPI for 34-month med check.  Grace Butler is doing well with current meds.  Able to enjoy things.  Is active in her church.  Energy and motivation are good.  Work is going well.   No extreme sadness, tearfulness, or feelings of hopelessness.  Sleeps well with the Seroquel .  Also takes 1/2 Xanax   in the evening to get her mind to quieten down so she can fall asleep. Doesn't take the Xanax  close to the time she takes Adderall .  ADLs and personal hygiene are normal.   States that attention is good without easy distractibility.  Able to focus on things and finish tasks to completion.  She's purposely losing weight.   No SI/HI.  No reports of increased energy with decreased need for sleep, increased talkativeness, impulsivity or risky behaviors, increased spending, grandiosity, increased irritability or anger, paranoia, or hallucinations.  Individual Medical History/ Review of Systems: Changes? :No      Past medications for mental health diagnoses include: Adderall  XR, Seroquel , Abilify , trazodone, Serzone, Elavil , Xanax , Ambien caused vivid nightmares  Allergies: Codeine, Sulfa antibiotics, and Trazodone and nefazodone  Current Medications:  Current Outpatient Medications:    ALPRAZolam  (XANAX ) 1 MG tablet, Take 1 tablet (1 mg total) by mouth at bedtime as needed for anxiety., Disp: 30 tablet, Rfl: 0   amitriptyline  (ELAVIL ) 50 MG tablet, Take 1 tablet (50 mg total) by mouth every evening., Disp: 7 tablet, Rfl: 0   amphetamine -dextroamphetamine  (ADDERALL  XR) 30 MG 24 hr capsule, Take 1 capsule (30 mg total) by mouth daily., Disp: 30 capsule, Rfl: 0   amphetamine -dextroamphetamine  (ADDERALL  XR) 30 MG 24 hr capsule, Take 1 capsule (30 mg total) by mouth daily., Disp: 30 capsule, Rfl: 0   amphetamine -dextroamphetamine  (ADDERALL  XR) 30 MG 24 hr capsule, Take 1 capsule (30 mg total) by mouth daily., Disp: 30 capsule, Rfl: 0   amphetamine -dextroamphetamine  (ADDERALL ) 10 MG tablet, Take 1 tablet (10 mg total) by mouth daily with lunch., Disp: 30 tablet, Rfl: 0   amphetamine -dextroamphetamine  (ADDERALL ) 10 MG tablet, Take 1 tablet (10 mg total) by mouth daily with lunch., Disp: 30 tablet, Rfl: 0   amphetamine -dextroamphetamine  (ADDERALL ) 10 MG tablet, Take 1 tablet (10 mg total) by mouth daily  with lunch., Disp: 30 tablet, Rfl: 0   ARIPiprazole  (ABILIFY ) 15 MG tablet, TAKE 1 TABLET DAILY, Disp: 90 tablet, Rfl: 0   Ascorbic Acid (VITAMIN C) 100 MG tablet, Take 100 mg by mouth daily., Disp: , Rfl:    butalbital -acetaminophen-caffeine  (FIORICET) 50-325-40 MG tablet, Take 1-2 tablets  by mouth every 6 (six) hours as needed for headache. Max 6 per 24 hours, Disp: 14 tablet, Rfl: 0   calcium citrate-vitamin D  (CITRACAL+D) 315-200 MG-UNIT tablet, Take 1 tablet by mouth 2 (two) times daily., Disp: , Rfl:    cholecalciferol (VITAMIN D ) 1000 UNITS tablet, Take 1,000 Units by mouth daily., Disp: , Rfl:    diclofenac  (VOLTAREN ) 25 MG EC tablet, TAKE 1 TABLET TWICE A DAY AS NEEDED, Disp: 180 tablet, Rfl: 0   estradiol  (CLIMARA  - DOSED IN MG/24 HR) 0.0375 mg/24hr patch, Place 0.0375 mg onto the skin once a week., Disp: , Rfl:    estradiol  (CLIMARA ) 0.0375 mg/24hr patch, Place 1 patch (0.0375 mg total) onto the skin once a week., Disp: 12 patch, Rfl: 3   furosemide  (LASIX ) 20 MG tablet, Take 1 tablet (20 mg total) by mouth daily as needed for leg edema, Disp: 30 tablet, Rfl: 3   gabapentin  (NEURONTIN ) 300 MG capsule, Take 1 capsule (300 mg total) by mouth at bedtime., Disp: 90 capsule, Rfl: 1   hydroxychloroquine  (PLAQUENIL ) 200 MG tablet, Take 1 tablet (200 mg total) by mouth 2 (two) times daily., Disp: 180 tablet, Rfl: 1   levothyroxine  (SYNTHROID ) 175 MCG tablet, TAKE 1 TABLET DAILY BEFORE BREAKFAST, Disp: 90 tablet, Rfl: 3   lisinopril  (ZESTRIL ) 10 MG tablet, Take 1 tablet (10 mg total) by mouth daily., Disp: 90 tablet, Rfl: 3   omeprazole  (PRILOSEC) 40 MG capsule, Take 1 capsule (40 mg total) by mouth in the morning and at bedtime., Disp: 60 capsule, Rfl: 0   QUEtiapine  (SEROQUEL ) 50 MG tablet, Take 50 mg by mouth at bedtime., Disp: , Rfl:    RINVOQ  15 MG TB24, TAKE 1 TABLET DAILY, Disp: 30 tablet, Rfl: 0   rizatriptan  (MAXALT -MLT) 10 MG disintegrating tablet, Take 1 tablet (10 mg total) by mouth as needed for migraine. May repeat in 2 hours if needed.  Office visit is needed please, Disp: 10 tablet, Rfl: 0   Sodium Hyaluronate, oral, (HYALURONIC ACID PO), Take by mouth., Disp: , Rfl:    Specialty Vitamins Products (MAGNESIUM, AMINO ACID CHELATE,) 133 MG tablet, Take 1 tablet by mouth 2  (two) times daily., Disp: , Rfl:    tirzepatide  (ZEPBOUND ) 5 MG/0.5ML Pen, Inject 5 mg into the skin once a week., Disp: 6 mL, Rfl: 2   valACYclovir  (VALTREX ) 1000 MG tablet, Take 1 tablet (1,000 mg total) by mouth daily. Take for 5 days for outbreak as needed, Disp: 30 tablet, Rfl: 1   QUEtiapine  (SEROQUEL ) 200 MG tablet, Take 1 tablet (200 mg total) by mouth at bedtime., Disp: 90 tablet, Rfl: 1   Zoster Vaccine Adjuvanted (SHINGRIX ) injection, Inject into the muscle., Disp: 1 mL, Rfl: 0 Medication Side Effects: none  Family Medical/ Social History: Changes? no  MENTAL HEALTH EXAM:  There were no vitals taken for this visit.There is no height or weight on file to calculate BMI.  General Appearance: Casual, Well Groomed, and Obese  Eye Contact:  Good  Speech:  Clear and Coherent and Normal Rate  Volume:  Normal  Mood:  Euthymic  Affect:  Congruent  Thought Process:  Goal Directed and Descriptions of Associations: Intact  Orientation:  Full (Time, Place, and Person)  Thought Content: Logical   Suicidal Thoughts:  No  Homicidal Thoughts:  No  Memory:  WNL  Judgement:  Good  Insight:  Good  Psychomotor Activity:  Normal  Concentration:  Concentration: Good and Attention Span: Good  Recall:  Good  Fund of Knowledge: Good  Language: Good  Assets:  Communication Skills Desire for Improvement Financial Resources/Insurance Housing Resilience Transportation Vocational/Educational  ADL's:  Intact  Cognition: WNL  Prognosis:  Good    PCP follows labs  DIAGNOSES:    ICD-10-CM   1. Attention deficit hyperactivity disorder (ADHD), combined type  F90.2     2. Generalized anxiety disorder  F41.1     3. Bipolar I disorder (HCC)  F31.9     4. Insomnia, unspecified type  G47.00      Receiving Psychotherapy: No   RECOMMENDATIONS:  PDMP was reviewed.  Adderall  09/12/2023.  Xanax  filled 09/07/2023. I provided approximately 20  minutes of non-face-to-face time during this encounter,  including time spent before and after the visit in records review, medical decision making, counseling pertinent to today's visit, and charting.   She's doing well so no changes need to be made.   Continue Xanax  1 mg before flying prn. Continue amitriptyline  to 100 mg p.o. nightly for migraine prevention as well as insomnia.   Continue Adderall  XR 30 mg, 1 q am. Continue Adderall  10 mg, 1 at lunch.  Continue Abilify  15 mg, 1 p.o. daily. Continue gabapentin  300 mg, 1 p.o. nightly. Continue Seroquel  200 mg + 50 mg at bedtime.  Return in 6 months.  Verneita Cooks, PA-C

## 2023-09-26 NOTE — Progress Notes (Signed)
 [facility]   Patient ID: Grace Butler,  MRN: [id number]   PCP: Grace Harlene BROCKS, MD   Date of Evaluation: 09/26/2023 Time spent:20 minutes   Chief Complaint:  Chief Complaint   ADHD; Insomnia; Anxiety; Depression; Follow-up      Virtual Visit via Telehealth   I connected with patient by a video enabled telemedicine application with their informed consent, and verified patient privacy and that I am speaking with the correct person using two identifiers.  I am private, in my office and the patient is at work.   I discussed the limitations, risks, security and privacy concerns of performing an evaluation and management service by video and the availability of in person appointments. I also discussed with the patient that there may be a patient responsible charge related to this service. The patient expressed understanding and agreed to proceed.   I discussed the assessment and treatment plan with the patient. The patient was provided an opportunity to ask questions and all were answered. The patient agreed with the plan and demonstrated an understanding of the instructions.   The patient was advised to call back or seek an in-person evaluation if the symptoms worsen or if the condition fails to improve as anticipated.   I provided 20 minutes of non-face-to-face time during this encounter.   HISTORY/CURRENT STATUS: HPI for 3-month med check.   Grace Butler is doing well with current meds.  Able to enjoy things.  Is active in her church.  Energy and motivation are good.  Work is going well.   No extreme sadness, tearfulness, or feelings of hopelessness.  Sleeps well with the Seroquel .  Also takes 1/2 Xanax   in the evening to get her mind to quieten down so she can fall asleep. Doesn't take the Xanax  close to the time she takes Adderall .  ADLs and personal hygiene are normal.   States that attention is good without easy distractibility.  Able to focus on things and finish tasks to  completion. She's purposely losing weight.   No SI/HI.   No reports of increased energy with decreased need for sleep, increased talkativeness, impulsivity or risky behaviors, increased spending, grandiosity, increased irritability or anger, paranoia, or hallucinations.   Individual Medical History/ Review of Systems: Changes? :No       Past medications for mental health diagnoses include: Adderall  XR, Seroquel , Abilify , trazodone, Serzone, Elavil , Xanax , Ambien caused vivid nightmares   Allergies: Codeine, Sulfa antibiotics, and Trazodone and nefazodone   Current Medications:  Current Medications  Current Outpatient Medications:    ALPRAZolam  (XANAX ) 1 MG tablet, Take 1 tablet (1 mg total) by mouth at bedtime as needed for anxiety., Disp: 30 tablet, Rfl: 0   amitriptyline  (ELAVIL ) 50 MG tablet, Take 1 tablet (50 mg total) by mouth every evening., Disp: 7 tablet, Rfl: 0   amphetamine -dextroamphetamine  (ADDERALL  XR) 30 MG 24 hr capsule, Take 1 capsule (30 mg total) by mouth daily., Disp: 30 capsule, Rfl: 0   amphetamine -dextroamphetamine  (ADDERALL  XR) 30 MG 24 hr capsule, Take 1 capsule (30 mg total) by mouth daily., Disp: 30 capsule, Rfl: 0   amphetamine -dextroamphetamine  (ADDERALL  XR) 30 MG 24 hr capsule, Take 1 capsule (30 mg total) by mouth daily., Disp: 30 capsule, Rfl: 0   amphetamine -dextroamphetamine  (ADDERALL ) 10 MG tablet, Take 1 tablet (10 mg total) by mouth daily with lunch., Disp: 30 tablet, Rfl: 0   amphetamine -dextroamphetamine  (ADDERALL ) 10 MG tablet, Take 1 tablet (10 mg total) by mouth daily with lunch., Disp: 30  tablet, Rfl: 0   amphetamine -dextroamphetamine  (ADDERALL ) 10 MG tablet, Take 1 tablet (10 mg total) by mouth daily with lunch., Disp: 30 tablet, Rfl: 0   ARIPiprazole  (ABILIFY ) 15 MG tablet, TAKE 1 TABLET DAILY, Disp: 90 tablet, Rfl: 0   Ascorbic Acid (VITAMIN C) 100 MG tablet, Take 100 mg by mouth daily., Disp: , Rfl:    butalbital -acetaminophen-caffeine  (FIORICET)  50-325-40 MG tablet, Take 1-2 tablets by mouth every 6 (six) hours as needed for headache. Max 6 per 24 hours, Disp: 14 tablet, Rfl: 0   calcium citrate-vitamin D  (CITRACAL+D) 315-200 MG-UNIT tablet, Take 1 tablet by mouth 2 (two) times daily., Disp: , Rfl:    cholecalciferol (VITAMIN D ) 1000 UNITS tablet, Take 1,000 Units by mouth daily., Disp: , Rfl:    diclofenac  (VOLTAREN ) 25 MG EC tablet, TAKE 1 TABLET TWICE A DAY AS NEEDED, Disp: 180 tablet, Rfl: 0   estradiol  (CLIMARA  - DOSED IN MG/24 HR) 0.0375 mg/24hr patch, Place 0.0375 mg onto the skin once a week., Disp: , Rfl:    estradiol  (CLIMARA ) 0.0375 mg/24hr patch, Place 1 patch (0.0375 mg total) onto the skin once a week., Disp: 12 patch, Rfl: 3   furosemide  (LASIX ) 20 MG tablet, Take 1 tablet (20 mg total) by mouth daily as needed for leg edema, Disp: 30 tablet, Rfl: 3   gabapentin  (NEURONTIN ) 300 MG capsule, Take 1 capsule (300 mg total) by mouth at bedtime., Disp: 90 capsule, Rfl: 1   hydroxychloroquine  (PLAQUENIL ) 200 MG tablet, Take 1 tablet (200 mg total) by mouth 2 (two) times daily., Disp: 180 tablet, Rfl: 1   levothyroxine  (SYNTHROID ) 175 MCG tablet, TAKE 1 TABLET DAILY BEFORE BREAKFAST, Disp: 90 tablet, Rfl: 3   lisinopril  (ZESTRIL ) 10 MG tablet, Take 1 tablet (10 mg total) by mouth daily., Disp: 90 tablet, Rfl: 3   omeprazole  (PRILOSEC) 40 MG capsule, Take 1 capsule (40 mg total) by mouth in the morning and at bedtime., Disp: 60 capsule, Rfl: 0   QUEtiapine  (SEROQUEL ) 50 MG tablet, Take 50 mg by mouth at bedtime., Disp: , Rfl:    RINVOQ  15 MG TB24, TAKE 1 TABLET DAILY, Disp: 30 tablet, Rfl: 0   rizatriptan  (MAXALT -MLT) 10 MG disintegrating tablet, Take 1 tablet (10 mg total) by mouth as needed for migraine. May repeat in 2 hours if needed.  Office visit is needed please, Disp: 10 tablet, Rfl: 0   Sodium Hyaluronate, oral, (HYALURONIC ACID PO), Take by mouth., Disp: , Rfl:    Specialty Vitamins Products (MAGNESIUM, AMINO ACID CHELATE,)  133 MG tablet, Take 1 tablet by mouth 2 (two) times daily., Disp: , Rfl:    tirzepatide  (ZEPBOUND ) 5 MG/0.5ML Pen, Inject 5 mg into the skin once a week., Disp: 6 mL, Rfl: 2   valACYclovir  (VALTREX ) 1000 MG tablet, Take 1 tablet (1,000 mg total) by mouth daily. Take for 5 days for outbreak as needed, Disp: 30 tablet, Rfl: 1   QUEtiapine  (SEROQUEL ) 200 MG tablet, Take 1 tablet (200 mg total) by mouth at bedtime., Disp: 90 tablet, Rfl: 1   Zoster Vaccine Adjuvanted (SHINGRIX ) injection, Inject into the muscle., Disp: 1 mL, Rfl: 0   Medication Side Effects: none   Family Medical/ Social History: Changes? no   MENTAL HEALTH EXAM:   There were no vitals taken for this visit.There is no height or weight on file to calculate BMI.  General Appearance: Casual, Well Groomed, and Obese  Eye Contact:  Good  Speech:  Clear and Coherent and Normal Rate  Volume:  Normal  Mood:  Euthymic  Affect:  Congruent  Thought Process:  Goal Directed and Descriptions of Associations: Intact  Orientation:  Full (Time, Place, and Person)  Thought Content: Logical   Suicidal Thoughts:  No  Homicidal Thoughts:  No  Memory:  WNL  Judgement:  Good  Insight:  Good  Psychomotor Activity:  Normal  Concentration:  Concentration: Good and Attention Span: Good  Recall:  Good  Fund of Knowledge: Good  Language: Good  Assets:  Communication Skills Desire for Improvement Financial Resources/Insurance Housing Resilience Transportation Vocational/Educational  ADL's:  Intact  Cognition: WNL  Prognosis:  Good    PCP follows labs   DIAGNOSES:      ICD-10-CM    1. Attention deficit hyperactivity disorder (ADHD), combined type  F90.2       2. Generalized anxiety disorder  F41.1       3. Bipolar I disorder (HCC)  F31.9       4. Insomnia, unspecified type  G47.00         Receiving Psychotherapy: No    RECOMMENDATIONS:  PDMP was reviewed.  Adderall  09/12/2023.  Xanax  filled 09/07/2023. I provided approximately  20  minutes of non-face-to-face time during this encounter, including time spent before and after the visit in records review, medical decision making, counseling pertinent to today's visit, and charting.    She's doing well so no changes need to be made.    Continue Xanax  1 mg before flying prn. Continue amitriptyline  to 100 mg p.o. nightly for migraine prevention as well as insomnia.   Continue Adderall  XR 30 mg, 1 q am. Continue Adderall  10 mg, 1 at lunch.  Continue Abilify  15 mg, 1 p.o. daily. Continue gabapentin  300 mg, 1 p.o. nightly. Continue Seroquel  200 mg + 50 mg at bedtime.  Return in 6 months.   Verneita Cooks, PA-C

## 2023-09-26 NOTE — Progress Notes (Deleted)
 [facility]  Patient ID: Grace Butler,  MRN: [id number]  PCP: Watt Harlene BROCKS, MD  Date of Evaluation: 09/26/2023 Time spent:20 minutes  Chief Complaint:    Virtual Visit via Telehealth  I connected with patient by a video enabled telemedicine application with their informed consent, and verified patient privacy and that I am speaking with the correct person using two identifiers.  I am private, in my office and the patient is at work.  I discussed the limitations, risks, security and privacy concerns of performing an evaluation and management service by video and the availability of in person appointments. I also discussed with the patient that there may be a patient responsible charge related to this service. The patient expressed understanding and agreed to proceed.   I discussed the assessment and treatment plan with the patient. The patient was provided an opportunity to ask questions and all were answered. The patient agreed with the plan and demonstrated an understanding of the instructions.   The patient was advised to call back or seek an in-person evaluation if the symptoms worsen or if the condition fails to improve as anticipated.  I provided 20 minutes of non-face-to-face time during this encounter.  HISTORY/CURRENT STATUS: HPI for 41-month med check.  States that attention is good without easy distractibility.  Able to focus on things and finish tasks to completion.   Takes 1/2 Xanax  at night to help sleep.   Individual Medical History/ Review of Systems: Changes? :No   RA is stable.  Past medications for mental health diagnoses include: Adderall  XR, Seroquel , Abilify , trazodone, Serzone, Elavil , Xanax , Ambien caused vivid nightmares  Allergies: Codeine, Sulfa antibiotics, and Trazodone and nefazodone  Current Medications:  Current Outpatient Medications:  .  ARIPiprazole  (ABILIFY ) 15 MG tablet, TAKE 1 TABLET DAILY, Disp: 90 tablet, Rfl: 0 .   ALPRAZolam  (XANAX ) 1 MG tablet, Take 1 tablet (1 mg total) by mouth at bedtime as needed for anxiety., Disp: 30 tablet, Rfl: 0 .  amitriptyline  (ELAVIL ) 50 MG tablet, Take 1 tablet (50 mg total) by mouth every evening., Disp: 7 tablet, Rfl: 0 .  amphetamine -dextroamphetamine  (ADDERALL  XR) 30 MG 24 hr capsule, Take 1 capsule (30 mg total) by mouth daily., Disp: 30 capsule, Rfl: 0 .  amphetamine -dextroamphetamine  (ADDERALL  XR) 30 MG 24 hr capsule, Take 1 capsule (30 mg total) by mouth daily., Disp: 30 capsule, Rfl: 0 .  amphetamine -dextroamphetamine  (ADDERALL  XR) 30 MG 24 hr capsule, Take 1 capsule (30 mg total) by mouth daily., Disp: 30 capsule, Rfl: 0 .  amphetamine -dextroamphetamine  (ADDERALL ) 10 MG tablet, Take 1 tablet (10 mg total) by mouth daily with lunch., Disp: 30 tablet, Rfl: 0 .  amphetamine -dextroamphetamine  (ADDERALL ) 10 MG tablet, Take 1 tablet (10 mg total) by mouth daily with lunch., Disp: 30 tablet, Rfl: 0 .  amphetamine -dextroamphetamine  (ADDERALL ) 10 MG tablet, Take 1 tablet (10 mg total) by mouth daily with lunch., Disp: 30 tablet, Rfl: 0 .  Ascorbic Acid (VITAMIN C) 100 MG tablet, Take 100 mg by mouth daily., Disp: , Rfl:  .  butalbital -acetaminophen-caffeine  (FIORICET) 50-325-40 MG tablet, Take 1-2 tablets by mouth every 6 (six) hours as needed for headache. Max 6 per 24 hours, Disp: 14 tablet, Rfl: 0 .  calcium citrate-vitamin D  (CITRACAL+D) 315-200 MG-UNIT tablet, Take 1 tablet by mouth 2 (two) times daily., Disp: , Rfl:  .  cholecalciferol (VITAMIN D ) 1000 UNITS tablet, Take 1,000 Units by mouth daily., Disp: , Rfl:  .  diclofenac  (VOLTAREN ) 25 MG EC  tablet, TAKE 1 TABLET TWICE A DAY AS NEEDED, Disp: 180 tablet, Rfl: 0 .  estradiol  (CLIMARA  - DOSED IN MG/24 HR) 0.0375 mg/24hr patch, Place 0.0375 mg onto the skin once a week., Disp: , Rfl:  .  estradiol  (CLIMARA ) 0.0375 mg/24hr patch, Place 1 patch (0.0375 mg total) onto the skin once a week., Disp: 12 patch, Rfl: 3 .   furosemide  (LASIX ) 20 MG tablet, Take 1 tablet (20 mg total) by mouth daily as needed for leg edema, Disp: 30 tablet, Rfl: 3 .  gabapentin  (NEURONTIN ) 300 MG capsule, Take 1 capsule (300 mg total) by mouth at bedtime., Disp: 90 capsule, Rfl: 1 .  hydroxychloroquine  (PLAQUENIL ) 200 MG tablet, Take 1 tablet (200 mg total) by mouth 2 (two) times daily., Disp: 180 tablet, Rfl: 1 .  levothyroxine  (SYNTHROID ) 175 MCG tablet, TAKE 1 TABLET DAILY BEFORE BREAKFAST, Disp: 90 tablet, Rfl: 3 .  lisinopril  (ZESTRIL ) 10 MG tablet, Take 1 tablet (10 mg total) by mouth daily., Disp: 90 tablet, Rfl: 3 .  omeprazole  (PRILOSEC) 40 MG capsule, Take 1 capsule (40 mg total) by mouth in the morning and at bedtime., Disp: 60 capsule, Rfl: 0 .  QUEtiapine  (SEROQUEL ) 200 MG tablet, Take 1 - 1.5 tablets (200-300 mg total) by mouth at bedtime., Disp: 45 tablet, Rfl: 1 .  RINVOQ  15 MG TB24, TAKE 1 TABLET DAILY, Disp: 30 tablet, Rfl: 0 .  rizatriptan  (MAXALT -MLT) 10 MG disintegrating tablet, Take 1 tablet (10 mg total) by mouth as needed for migraine. May repeat in 2 hours if needed.  Office visit is needed please, Disp: 10 tablet, Rfl: 0 .  Sodium Hyaluronate, oral, (HYALURONIC ACID PO), Take by mouth., Disp: , Rfl:  .  Specialty Vitamins Products (MAGNESIUM, AMINO ACID CHELATE,) 133 MG tablet, Take 1 tablet by mouth 2 (two) times daily., Disp: , Rfl:  .  tirzepatide  (ZEPBOUND ) 5 MG/0.5ML Pen, Inject 5 mg into the skin once a week., Disp: 6 mL, Rfl: 2 .  valACYclovir  (VALTREX ) 1000 MG tablet, Take 1 tablet (1,000 mg total) by mouth daily. Take for 5 days for outbreak as needed, Disp: 30 tablet, Rfl: 1 .  Zoster Vaccine Adjuvanted (SHINGRIX ) injection, Inject into the muscle., Disp: 1 mL, Rfl: 0 Medication Side Effects: none  Family Medical/ Social History: Changes?  See HPI  MENTAL HEALTH EXAM:  There were no vitals taken for this visit.There is no height or weight on file to calculate BMI.  General Appearance: Casual, Well  Groomed, and Obese  Eye Contact:  Good  Speech:  Clear and Coherent and Normal Rate  Volume:  Normal  Mood:  Euthymic  Affect:  Congruent  Thought Process:  Goal Directed and Descriptions of Associations: Intact  Orientation:  Full (Time, Place, and Person)  Thought Content: Logical   Suicidal Thoughts:  No  Homicidal Thoughts:  No  Memory:  WNL  Judgement:  Good  Insight:  Good  Psychomotor Activity:  Normal  Concentration:  Concentration: Good and Attention Span: Good  Recall:  Good  Fund of Knowledge: Good  Language: Good  Assets:  Desire for Improvement Financial Resources/Insurance Housing Transportation Vocational/Educational  ADL's:  Intact  Cognition: WNL  Prognosis:  Good    PCP follows labs  DIAGNOSES:  No diagnosis found.  Receiving Psychotherapy: No   RECOMMENDATIONS:  PDMP was reviewed.  Adderall  filled 09/12/2023.  Gabapentin  filled 07/10/2023.  Xanax  filled 09/07/2023.      Continue Xanax  1 mg before flying prn. Continue amitriptyline  100 mg  p.o. nightly for migraine prevention as well as insomnia.   Continue Adderall  XR 30 mg, 1 q am. Continue Adderall  10 mg, 1 at lunch.  Continue Abilify  15 mg, 1 p.o. daily. Continue gabapentin  300 mg, 1 p.o. nightly.    Strongly recommend counseling.  Return in 6 months.  Verneita Cooks, PA-C

## 2023-09-27 ENCOUNTER — Other Ambulatory Visit (HOSPITAL_COMMUNITY): Payer: Self-pay

## 2023-09-27 ENCOUNTER — Other Ambulatory Visit: Payer: Self-pay | Admitting: Internal Medicine

## 2023-09-27 DIAGNOSIS — M0600 Rheumatoid arthritis without rheumatoid factor, unspecified site: Secondary | ICD-10-CM

## 2023-09-27 MED ORDER — AMPHETAMINE-DEXTROAMPHETAMINE 10 MG PO TABS: 10.0000 mg | ORAL_TABLET | Freq: Every day | ORAL | 0 refills | Status: DC

## 2023-09-27 NOTE — Telephone Encounter (Signed)
 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

## 2023-09-29 ENCOUNTER — Other Ambulatory Visit (HOSPITAL_COMMUNITY): Payer: Self-pay

## 2023-10-02 ENCOUNTER — Ambulatory Visit: Admitting: Internal Medicine

## 2023-10-02 DIAGNOSIS — Z79899 Other long term (current) drug therapy: Secondary | ICD-10-CM

## 2023-10-02 DIAGNOSIS — M0600 Rheumatoid arthritis without rheumatoid factor, unspecified site: Secondary | ICD-10-CM

## 2023-10-06 ENCOUNTER — Other Ambulatory Visit: Payer: Self-pay | Admitting: Physician Assistant

## 2023-10-06 ENCOUNTER — Other Ambulatory Visit (HOSPITAL_COMMUNITY): Payer: Self-pay

## 2023-10-06 DIAGNOSIS — F902 Attention-deficit hyperactivity disorder, combined type: Secondary | ICD-10-CM

## 2023-10-10 NOTE — Telephone Encounter (Signed)
 Pt Lvm @ 3:31p requesting refill of Adderall  XR 30mg  to   [facility] [address], [place] [address] Phone: [phone]  Fax: [phone] DEA #: [id number]     She says she has enough till Sat but lives in [place] and would like to pick it up on Friday. No upcoming appt scheduled.

## 2023-10-11 ENCOUNTER — Other Ambulatory Visit (HOSPITAL_COMMUNITY): Payer: Self-pay

## 2023-10-11 MED ORDER — AMPHETAMINE-DEXTROAMPHET ER 30 MG PO CP24
30.0000 mg | ORAL_CAPSULE | Freq: Every day | ORAL | 0 refills | Status: DC
  Filled 2023-10-11: qty 30, 30d supply, fill #0

## 2023-10-11 MED ORDER — AMPHETAMINE-DEXTROAMPHET ER 30 MG PO CP24
30.0000 mg | ORAL_CAPSULE | Freq: Every day | ORAL | 0 refills | Status: DC
  Filled 2023-12-11 – 2023-12-14 (×2): qty 30, 30d supply, fill #0

## 2023-10-11 MED ORDER — AMPHETAMINE-DEXTROAMPHET ER 30 MG PO CP24
30.0000 mg | ORAL_CAPSULE | Freq: Every day | ORAL | 0 refills | Status: DC
  Filled 2023-11-13 (×2): qty 30, 30d supply, fill #0

## 2023-10-16 ENCOUNTER — Other Ambulatory Visit (HOSPITAL_COMMUNITY): Payer: Self-pay

## 2023-10-17 ENCOUNTER — Other Ambulatory Visit (HOSPITAL_COMMUNITY): Payer: Self-pay

## 2023-10-17 ENCOUNTER — Other Ambulatory Visit: Payer: Self-pay

## 2023-10-17 ENCOUNTER — Other Ambulatory Visit (HOSPITAL_BASED_OUTPATIENT_CLINIC_OR_DEPARTMENT_OTHER): Payer: Self-pay

## 2023-10-18 ENCOUNTER — Other Ambulatory Visit: Payer: Self-pay

## 2023-10-18 ENCOUNTER — Other Ambulatory Visit (HOSPITAL_COMMUNITY): Payer: Self-pay

## 2023-10-20 ENCOUNTER — Other Ambulatory Visit (HOSPITAL_BASED_OUTPATIENT_CLINIC_OR_DEPARTMENT_OTHER): Payer: Self-pay

## 2023-10-27 ENCOUNTER — Other Ambulatory Visit: Payer: Self-pay | Admitting: Physician Assistant

## 2023-10-31 ENCOUNTER — Other Ambulatory Visit (HOSPITAL_BASED_OUTPATIENT_CLINIC_OR_DEPARTMENT_OTHER): Payer: Self-pay

## 2023-11-02 ENCOUNTER — Telehealth: Payer: Self-pay | Admitting: Physician Assistant

## 2023-11-02 ENCOUNTER — Other Ambulatory Visit: Payer: Self-pay

## 2023-11-02 ENCOUNTER — Other Ambulatory Visit (HOSPITAL_COMMUNITY): Payer: Self-pay

## 2023-11-02 DIAGNOSIS — F411 Generalized anxiety disorder: Secondary | ICD-10-CM

## 2023-11-02 MED ORDER — ALPRAZOLAM 1 MG PO TABS
1.0000 mg | ORAL_TABLET | Freq: Every evening | ORAL | 5 refills | Status: AC | PRN
  Filled 2023-11-02: qty 30, 30d supply, fill #0
  Filled 2023-12-07: qty 30, 30d supply, fill #1
  Filled 2024-01-12: qty 30, 30d supply, fill #2

## 2023-11-02 NOTE — Telephone Encounter (Signed)
 Pt called at 9:30a requesting refill of Alprazolam  to   [facility] [address], [place] [address] Phone: [phone]  Fax: [phone]   No upcoming appt scheduled.

## 2023-11-02 NOTE — Telephone Encounter (Signed)
 Pended

## 2023-11-03 ENCOUNTER — Other Ambulatory Visit (HOSPITAL_COMMUNITY): Payer: Self-pay

## 2023-11-06 ENCOUNTER — Telehealth: Payer: Self-pay | Admitting: Physician Assistant

## 2023-11-06 DIAGNOSIS — G43909 Migraine, unspecified, not intractable, without status migrainosus: Secondary | ICD-10-CM

## 2023-11-06 NOTE — Telephone Encounter (Signed)
 Grace Butler called and said that she needs a 7 day supply of her amitriptyline  50 mg. She needs it to go to [facility]. She is waiting for mail order and it won;t be here until next week

## 2023-11-07 ENCOUNTER — Other Ambulatory Visit (HOSPITAL_COMMUNITY): Payer: Self-pay

## 2023-11-07 ENCOUNTER — Telehealth

## 2023-11-07 ENCOUNTER — Telehealth: Admitting: Physician Assistant

## 2023-11-07 DIAGNOSIS — R21 Rash and other nonspecific skin eruption: Secondary | ICD-10-CM | POA: Diagnosis not present

## 2023-11-07 MED ORDER — PREDNISONE 10 MG (21) PO TBPK
ORAL_TABLET | ORAL | 0 refills | Status: DC
  Filled 2023-11-07: qty 21, 6d supply, fill #0

## 2023-11-07 MED ORDER — AMITRIPTYLINE HCL 50 MG PO TABS
50.0000 mg | ORAL_TABLET | Freq: Every evening | ORAL | 0 refills | Status: AC
  Filled 2023-11-07: qty 7, 7d supply, fill #0

## 2023-11-07 NOTE — Telephone Encounter (Signed)
 Sent!

## 2023-11-07 NOTE — Progress Notes (Signed)
 E Visit for Rash  We are sorry that you are not feeling well. Here is how we plan to help!  Based on what you shared with me you may have an allergic dermatitis. Continue to keep the skin clean and dry. Cold compresses can be beneficial. Ok to continue the Benadryl . I am adding on a steroid pack to take as directed to help calm this down more quickly.    HOME CARE:  Take cool showers and avoid direct sunlight. Apply cool compress or wet dressings. Take a bath in an oatmeal bath.  Sprinkle content of one Aveeno packet under running faucet with comfortably warm water.  Bathe for 15-20 minutes, 1-2 times daily.  Pat dry with a towel. Do not rub the rash. Use hydrocortisone cream. Take an antihistamine like Benadryl  for widespread rashes that itch.  The adult dose of Benadryl  is 25-50 mg by mouth 4 times daily. Caution:  This type of medication may cause sleepiness.  Do not drink alcohol, drive, or operate dangerous machinery while taking antihistamines.  Do not take these medications if you have prostate enlargement.  Read package instructions thoroughly on all medications that you take.  GET HELP RIGHT AWAY IF:  Symptoms don't go away after treatment. Severe itching that persists. If you rash spreads or swells. If you rash begins to smell. If it blisters and opens or develops a yellow-brown crust. You develop a fever. You have a sore throat. You become short of breath.  MAKE SURE YOU:  Understand these instructions. Will watch your condition. Will get help right away if you are not doing well or get worse.  Thank you for choosing an e-visit. Your e-visit answers were reviewed by a board certified advanced clinical practitioner to complete your personal care plan. Depending upon the condition, your plan could have included both over the counter or prescription medications. Please review your pharmacy choice. Be sure that the pharmacy you have chosen is open so that you can pick up  your prescription now.  If there is a problem you may message your provider in MyChart to have the prescription routed to another pharmacy. Your safety is important to us . If you have drug allergies check your prescription carefully.  For the next 24 hours, you can use MyChart to ask questions about today's visit, request a non-urgent call back, or ask for a work or school excuse from your e-visit provider. You will get an email in the next two days asking about your experience. I hope that your e-visit has been valuable and will speed your recovery.  I have spent 8 minutes in review of e-visit questionnaire, review and updating patient chart, medical decision making and response to patient.   [name], PA-C

## 2023-11-07 NOTE — Progress Notes (Signed)
 Message sent to patient requesting further input regarding current symptoms. Awaiting patient response.

## 2023-11-13 ENCOUNTER — Other Ambulatory Visit (HOSPITAL_COMMUNITY): Payer: Self-pay

## 2023-11-14 ENCOUNTER — Other Ambulatory Visit: Payer: Self-pay | Admitting: Physician Assistant

## 2023-11-14 DIAGNOSIS — G43909 Migraine, unspecified, not intractable, without status migrainosus: Secondary | ICD-10-CM

## 2023-11-27 ENCOUNTER — Other Ambulatory Visit: Payer: Self-pay

## 2023-11-27 ENCOUNTER — Other Ambulatory Visit (HOSPITAL_COMMUNITY): Payer: Self-pay

## 2023-11-27 ENCOUNTER — Ambulatory Visit
Admission: EM | Admit: 2023-11-27 | Discharge: 2023-11-27 | Disposition: A | Discharge status: Home / Self Care | Attending: Nurse Practitioner | Admitting: Nurse Practitioner

## 2023-11-27 DIAGNOSIS — M5441 Lumbago with sciatica, right side: Secondary | ICD-10-CM | POA: Diagnosis not present

## 2023-11-27 MED ORDER — CYCLOBENZAPRINE HCL 10 MG PO TABS
10.0000 mg | ORAL_TABLET | Freq: Three times a day (TID) | ORAL | 0 refills | Status: DC | PRN
  Filled 2023-11-27: qty 20, 7d supply, fill #0

## 2023-11-27 MED ORDER — KETOROLAC TROMETHAMINE 30 MG/ML IJ SOLN
30.0000 mg | Freq: Once | INTRAMUSCULAR | Status: AC
  Administered 2023-11-27: 30 mg via INTRAMUSCULAR

## 2023-11-27 MED ORDER — TRIAMCINOLONE ACETONIDE 40 MG/ML IJ SUSP
80.0000 mg | Freq: Once | INTRAMUSCULAR | Status: AC
  Administered 2023-11-27: 80 mg via INTRAMUSCULAR

## 2023-11-27 MED ORDER — KETOROLAC TROMETHAMINE 30 MG/ML IJ SOLN: 60.0000 mg | Freq: Once | INTRAMUSCULAR | Status: DC

## 2023-11-27 NOTE — ED Provider Notes (Addendum)
 [facility]    CSN: [id number] Arrival date & time: 11/27/23  0907   History   Chief Complaint Chief Complaint  Patient presents with   Back Pain    HPI Rethel B Butler is a 53 y.o. female.   Patient presents for evaluation of progressive low back pain with radiation into her right buttock and lower extremity.  Symptoms began approximately 16 days ago where she was dropping off some clothes at a shelter-lifted approximately 8 trash bags full of clothes.  Noted some mild discomfort after she completed this task.  The discomfort has progressively worsened until it is now interrupting her sleep.  No reported paresthesias, saddle anesthesias, or loss of bowel or bladder.  She does feel that her right lower leg is weakened due to the discomfort.  No reported fevers, chest pain, shortness of breath, abdominal pain, vomiting, diarrhea, or urinary symptoms.  She has been utilizing Tylenol arthritis 3 times daily without any improvement in the discomfort.  The history is provided by the patient.  Back Pain Associated symptoms: no abdominal pain, no chest pain, no dysuria, no fever and no headaches     Past Medical History:  Diagnosis Date   Bipolar 1 disorder (HCC)    Chronic headache    Depression    Gallstone    Hypertension    IBS (irritable bowel syndrome)    Obesity    Rheumatoid arthritis (HCC)    Thyroid  disease    Ulcer     Patient Active Problem List   Diagnosis Date Noted   High risk medication use 07/29/2020   Attention deficit hyperactivity disorder (ADHD) 09/28/2017   Insomnia, unspecified 09/28/2017   Acute meniscal tear of knee, left, subsequent encounter 02/10/2017   Left knee pain 09/01/2016   Obesity 01/14/2016   Rheumatoid arthritis (HCC) 01/14/2016   Hypothyroidism due to acquired atrophy of thyroid  01/14/2016   Osteopenia 09/15/2015   Bipolar 1 disorder (HCC) 08/21/2015   Gastroesophageal reflux disease without esophagitis 08/21/2015   Migraine  08/21/2015    Past Surgical History:  Procedure Laterality Date   ABDOMINAL HYSTERECTOMY     APPENDECTOMY     CHOLECYSTECTOMY     COLONOSCOPY  09/07/2018   CYSTECTOMY     dermoid   ESOPHAGOGASTRODUODENOSCOPY     about 20 years ago to check for ulcers   KNEE ARTHROSCOPY Left 2019   MYOMECTOMY     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 88   UPPER GASTROINTESTINAL ENDOSCOPY      OB History   No obstetric history on file.      Home Medications    Prior to Admission medications   Medication Sig Start Date End Date Taking? Authorizing Provider  cyclobenzaprine  (FLEXERIL ) 10 MG tablet Take 1 tablet (10 mg total) by mouth every 8 (eight) hours as needed for muscle spasms. 11/27/23  Yes Janet Therisa PARAS, FNP  ALPRAZolam  (XANAX ) 1 MG tablet Take 1 tablet (1 mg total) by mouth at bedtime as needed for anxiety. 11/02/23   Rhys Verneita DASEN, PA-C  amitriptyline  (ELAVIL ) 50 MG tablet Take 1 tablet (50 mg total) by mouth every evening. 11/07/23 05/05/24  Rhys Verneita T, PA-C  amphetamine -dextroamphetamine  (ADDERALL  XR) 30 MG 24 hr capsule Take 1 capsule (30 mg total) by mouth daily. 05/11/23   Rhys Verneita T, PA-C  amphetamine -dextroamphetamine  (ADDERALL  XR) 30 MG 24 hr capsule Take 1 capsule (30 mg total) by mouth daily. 08/14/23   Rhys Verneita T, PA-C  amphetamine -dextroamphetamine  (  ADDERALL  XR) 30 MG 24 hr capsule Take 1 capsule (30 mg total) by mouth daily. 10/11/23   Rhys Verneita DASEN, PA-C  amphetamine -dextroamphetamine  (ADDERALL  XR) 30 MG 24 hr capsule Take 1 capsule (30 mg total) by mouth daily. 11/08/23   Rhys Verneita T, PA-C  amphetamine -dextroamphetamine  (ADDERALL  XR) 30 MG 24 hr capsule Take 1 capsule (30 mg total) by mouth daily. 12/06/23   Rhys Verneita DASEN, PA-C  amphetamine -dextroamphetamine  (ADDERALL ) 10 MG tablet Take 1 tablet (10 mg total) by mouth daily with lunch. 04/28/23   Rhys Verneita T, PA-C  amphetamine -dextroamphetamine  (ADDERALL ) 10 MG tablet Take 1 tablet (10 mg total) by mouth daily with  lunch. 03/29/23   Rhys Verneita T, PA-C  amphetamine -dextroamphetamine  (ADDERALL ) 10 MG tablet Take 1 tablet (10 mg total) by mouth daily with lunch. 09/27/23   Rhys Verneita T, PA-C  ARIPiprazole  (ABILIFY ) 15 MG tablet TAKE 1 TABLET DAILY 09/14/23   Rhys Verneita T, PA-C  Ascorbic Acid (VITAMIN C) 100 MG tablet Take 100 mg by mouth daily.    [provider]  butalbital -acetaminophen-caffeine  (FIORICET) 50-325-40 MG tablet Take 1-2 tablets by mouth every 6 (six) hours as needed for headache. Max 6 per 24 hours 08/09/21   Copland, Jessica C, MD  calcium citrate-vitamin D  (CITRACAL+D) 315-200 MG-UNIT tablet Take 1 tablet by mouth 2 (two) times daily.    [provider]  cholecalciferol (VITAMIN D ) 1000 UNITS tablet Take 1,000 Units by mouth daily.    [provider]  diclofenac  (VOLTAREN ) 25 MG EC tablet TAKE 1 TABLET TWICE A DAY AS NEEDED 09/14/23   Rice, Lonni ORN, MD  estradiol  (CLIMARA  - DOSED IN MG/24 HR) 0.0375 mg/24hr patch Place 0.0375 mg onto the skin once a week.    [provider]  estradiol  (CLIMARA ) 0.0375 mg/24hr patch Place 1 patch (0.0375 mg total) onto the skin once a week. 01/26/23     furosemide  (LASIX ) 20 MG tablet Take 1 tablet (20 mg total) by mouth daily as needed for leg edema 03/10/22   Copland, Harlene BROCKS, MD  gabapentin  (NEURONTIN ) 300 MG capsule TAKE 1 CAPSULE AT BEDTIME 10/27/23   Hurst, Verneita T, PA-C  hydroxychloroquine  (PLAQUENIL ) 200 MG tablet TAKE 1 TABLET TWICE A DAY 09/27/23   Rice, Lonni ORN, MD  levothyroxine  (SYNTHROID ) 175 MCG tablet TAKE 1 TABLET DAILY BEFORE BREAKFAST 09/20/23   Copland, Harlene BROCKS, MD  lisinopril  (ZESTRIL ) 10 MG tablet Take 1 tablet (10 mg total) by mouth daily. 02/16/23   Copland, Harlene BROCKS, MD  omeprazole  (PRILOSEC) 40 MG capsule Take 1 capsule (40 mg total) by mouth in the morning and at bedtime. 08/21/23   Copland, Harlene BROCKS, MD  predniSONE  (STERAPRED UNI-PAK 21 TAB) 10 MG (21) TBPK tablet Take following  package directions 11/07/23   Gladis Elsie BROCKS, PA-C  QUEtiapine  (SEROQUEL ) 200 MG tablet Take 1 tablet (200 mg total) by mouth at bedtime. 09/26/23   Rhys Verneita T, PA-C  QUEtiapine  (SEROQUEL ) 50 MG tablet Take 50 mg by mouth at bedtime.    [provider]  RINVOQ  15 MG TB24 TAKE 1 TABLET DAILY 09/25/23   Jeannetta Lonni ORN, MD  rizatriptan  (MAXALT -MLT) 10 MG disintegrating tablet Take 1 tablet (10 mg total) by mouth as needed for migraine. May repeat in 2 hours if needed.  Office visit is needed please 01/10/23   Copland, Harlene BROCKS, MD  Sodium Hyaluronate, oral, (HYALURONIC ACID PO) Take by mouth.    [provider]  Specialty Vitamins Products (MAGNESIUM, AMINO  ACID CHELATE,) 133 MG tablet Take 1 tablet by mouth 2 (two) times daily.    [provider]  tirzepatide  (ZEPBOUND ) 5 MG/0.5ML Pen Inject 5 mg into the skin once a week. 04/26/23   Copland, Harlene BROCKS, MD  valACYclovir  (VALTREX ) 1000 MG tablet Take 1 tablet (1,000 mg total) by mouth daily. Take for 5 days for outbreak as needed 10/18/21   Copland, Harlene BROCKS, MD  Zoster Vaccine Adjuvanted (SHINGRIX ) injection Inject into the muscle. 04/01/22   Luiz Channel, MD    Family History Family History  Problem Relation Age of Onset   Hypertension Mother    Heart attack Father    Healthy Sister    Healthy Sister    Healthy Sister    Healthy Brother    Healthy Son    Rheum arthritis Paternal Uncle    Colon cancer Neg Hx    Esophageal cancer Neg Hx    Colon polyps Neg Hx    Rectal cancer Neg Hx    Stomach cancer Neg Hx     Social History Social History   Tobacco Use   Smoking status: Former    Current packs/day: 0.00    Average packs/day: 0.5 packs/day for 20.0 years (10.0 ttl pk-yrs)    Types: Cigarettes    Start date: 01/16/1994    Quit date: 01/16/2014    Years since quitting: 9.8   Smokeless tobacco: Never  Vaping Use   Vaping status: Some Days   Substances: Nicotine  Substance Use Topics    Alcohol use: Not Currently    Comment: Rarely   Drug use: No    Allergies   Codeine, Sulfa antibiotics, and Trazodone and nefazodone  Review of Systems Review of Systems  Constitutional:  Negative for chills and fever.  HENT:  Negative for congestion, rhinorrhea and sore throat.   Respiratory:  Negative for cough and shortness of breath.   Cardiovascular:  Negative for chest pain.  Gastrointestinal:  Negative for abdominal pain, diarrhea and vomiting.  Genitourinary:  Negative for dysuria.  Musculoskeletal:  Positive for back pain. Negative for myalgias.  Skin:  Negative for rash.  Neurological:  Negative for dizziness and headaches.  Psychiatric/Behavioral:  Positive for sleep disturbance.    Physical Exam Triage Vital Signs ED Triage Vitals  Encounter Vitals Group     BP 11/27/23 1040 112/82     Girls Systolic BP Percentile --      Girls Diastolic BP Percentile --      Boys Systolic BP Percentile --      Boys Diastolic BP Percentile --      Pulse Rate 11/27/23 1038 85     Resp 11/27/23 1038 15     Temp 11/27/23 1038 97.6 F (36.4 C)     Temp src --      SpO2 11/27/23 1038 95 %     Weight --      Height --      Head Circumference --      Peak Flow --      Pain Score 11/27/23 1038 8     Pain Loc --      Pain Education --      Exclude from Growth Chart --    No data found.  Updated Vital Signs BP 112/82   Pulse 85   Temp 97.6 F (36.4 C)   Resp 15   SpO2 95%   Physical Exam Vitals and nursing note reviewed.  Constitutional:  Appearance: Normal appearance.  Cardiovascular:     Rate and Rhythm: Normal rate and regular rhythm.     Heart sounds: No murmur heard. Pulmonary:     Effort: Pulmonary effort is normal.     Breath sounds: Normal breath sounds. No wheezing, rhonchi or rales.  Abdominal:     General: Bowel sounds are normal.  Musculoskeletal:     Comments: Documentation by exception No lumbar or sacral bony spinal tenderness.  She has  discrete right mid/lower lumbar paraspinal muscle tenderness that radiates into her right buttock and into the lateral/anterior aspect of her right proximal leg.  Equal/strong lower extremity push and pull.  Mild decrease weakness in the right seated knee raise against resistance due to discomfort.  Patellar reflexes 2+ bilaterally.  Able to perform full range of motion with flexion, extension, and rotation of her torso with mild discomfort.  Worsened with hyperextension.  Ambulatory independently.  Skin:    General: Skin is warm and dry.  Neurological:     General: No focal deficit present.     Mental Status: She is alert and oriented to person, place, and time.  Psychiatric:        Mood and Affect: Mood normal.        Behavior: Behavior normal.        Thought Content: Thought content normal.        Judgment: Judgment normal.    UC Treatments / Results  Labs (all labs ordered are listed, but only abnormal results are displayed) Labs Reviewed - No data to display  EKG   Radiology No results found.  Procedures Procedures (including critical care time)  Medications Ordered in UC Medications  triamcinolone  acetonide (KENALOG -40) injection 80 mg (has no administration in time range)  ketorolac  (TORADOL ) 30 MG/ML injection 60 mg (has no administration in time range)    Initial Impression / Assessment and Plan / UC Course  I have reviewed the triage vital signs and the nursing notes.  Pertinent labs & imaging results that were available during my care of the patient were reviewed by me and considered in my medical decision making (see chart for details).    Patient presents for evaluation of worsening right low back pain since lifting approximately 8 heavy trash bags full of close 16 days ago.  She has been utilizing Tylenol arthritis without any improvement.  No concerns for cauda equina syndrome.  On physical exam, she does have some mild decreased strength in her right lower  extremity due to discomfort.  Equal strength with bilateral lower extremity push/pull, decreased strength with right lower extremity seated knee raise against resistance.  Discomfort primarily noted with hyperextension of her spine.  Patient was given an injection of ketorolac  and triamcinolone  here in the clinic.  Discharged home with continued Tylenol arthritis and as needed cyclobenzaprine .  Encouraged alternating thermal therapy and lidocaine patches.  Red flags were reviewed which would warrant emergency department evaluation.  If symptoms fail to improve, she will follow-up for reevaluation. Final Clinical Impressions(s) / UC Diagnoses   Final diagnoses:  Acute right-sided low back pain with right-sided sciatica     Discharge Instructions      You were given an injection of Toradol  and Kenalog  to help decrease the inflammation in your back. Plan for you to continue Tylenol Arthritis for the next several days. A prescription for a muscle relaxer has been sent to the pharmacy Alternate hot/cold compresses May use Lidocaine patches 6.   If symptoms  worsen, please seek re-evaluation 7.   Do not take Flexeril  with any other of your anti-anxiety medications or medications that could make your drowsy.     ED Prescriptions     Medication Sig Dispense Auth. Provider   cyclobenzaprine  (FLEXERIL ) 10 MG tablet Take 1 tablet (10 mg total) by mouth every 8 (eight) hours as needed for muscle spasms. 20 tablet Janet Therisa PARAS, FNP      PDMP not reviewed this encounter.   Janet Therisa PARAS, FNP 11/27/23 1150    Janet Therisa PARAS, FNP 11/27/23 [phone]

## 2023-11-27 NOTE — Discharge Instructions (Addendum)
 You were given an injection of Toradol  and Kenalog  to help decrease the inflammation in your back. Plan for you to continue Tylenol Arthritis for the next several days. A prescription for a muscle relaxer has been sent to the pharmacy Alternate hot/cold compresses May use Lidocaine patches 6.   If symptoms worsen, please seek re-evaluation 7.   Do not take Flexeril  with any other of your anti-anxiety medications or medications that could make your drowsy.

## 2023-11-27 NOTE — ED Triage Notes (Signed)
 Pt present with back pain x one week. Pt states she was loading boxes of clothes and started feeling her back started hurting and radiated to the rt leg. Pt states it has gotten worse. States she cannot get comfortable in her sleep.  Home interventions: Tylenol Arthritis extra strength with no relief.

## 2023-12-02 ENCOUNTER — Encounter: Payer: Self-pay | Admitting: Gastroenterology

## 2023-12-07 ENCOUNTER — Other Ambulatory Visit (HOSPITAL_COMMUNITY): Payer: Self-pay

## 2023-12-11 ENCOUNTER — Other Ambulatory Visit (HOSPITAL_COMMUNITY): Payer: Self-pay

## 2023-12-11 ENCOUNTER — Other Ambulatory Visit: Payer: Self-pay

## 2023-12-12 ENCOUNTER — Other Ambulatory Visit (HOSPITAL_COMMUNITY): Payer: Self-pay

## 2023-12-12 ENCOUNTER — Telehealth: Payer: Self-pay | Admitting: Physician Assistant

## 2023-12-12 NOTE — Telephone Encounter (Signed)
 Patient called stating that she has tried a couple different pharmacies and they are both of stock on Adderall  30mg  She would like to try something else. Ph: [phone]

## 2023-12-13 ENCOUNTER — Other Ambulatory Visit: Payer: Self-pay | Admitting: Physician Assistant

## 2023-12-13 ENCOUNTER — Other Ambulatory Visit: Payer: Self-pay | Admitting: Internal Medicine

## 2023-12-13 ENCOUNTER — Other Ambulatory Visit (HOSPITAL_COMMUNITY): Payer: Self-pay

## 2023-12-13 DIAGNOSIS — M0600 Rheumatoid arthritis without rheumatoid factor, unspecified site: Secondary | ICD-10-CM

## 2023-12-13 NOTE — Telephone Encounter (Signed)
 Please schedule patient a follow up visit. Patient due September 2025. Thanks!   Follow-Up Instructions: Return in about 3 months (around 09/28/2023) for RA on UPA/HCQ/NSAID f/u 3mos.

## 2023-12-13 NOTE — Telephone Encounter (Signed)
 Attempted to contact patient and left message to advise patient to call the office and schedule appointment.

## 2023-12-13 NOTE — Telephone Encounter (Signed)
 Last Fill: 09/14/2023  Labs: 06/28/2023 ALT 32  Next Visit: Due September 2025. Message sent to the front to schedule.   Last Visit: 06/28/2023  DX: Rheumatoid arthritis with negative rheumatoid factor, involving unspecified site   Current Dose per office note 06/28/2023: diclofenac  25 mg twice daily as needed.   Okay to refill Diclofenac ?

## 2023-12-14 ENCOUNTER — Other Ambulatory Visit (HOSPITAL_COMMUNITY): Payer: Self-pay

## 2023-12-14 NOTE — Telephone Encounter (Signed)
 Can you find Adderall  IR 30 mg? If so, we could switch to that. The next closest related med is Adzenys  so we may have to go with that if she can't find anything else.  Changing to concerta is ok, but it's in the different stimulant category.

## 2023-12-14 NOTE — Telephone Encounter (Signed)
 LVM per DPR with information regarding stimulants.

## 2023-12-14 NOTE — Telephone Encounter (Signed)
 Pt reports not able to find Adderall  XR30 in stock. I asked her if she had checked with [facility] to see what dosages they have and said she was told they didn't have any. I have checked with [facility] myself and they don't have any ER available. We are seeing shortages with Vyvanse  and Focalin. The Concerta we aren't seeing shortages with so far. She checked with [facility] and was told they get two #30 bottles a week.   She said she will be out tomorrow and asking for something else.

## 2023-12-15 ENCOUNTER — Other Ambulatory Visit: Payer: Self-pay | Admitting: Internal Medicine

## 2023-12-15 DIAGNOSIS — M0600 Rheumatoid arthritis without rheumatoid factor, unspecified site: Secondary | ICD-10-CM

## 2023-12-15 NOTE — Telephone Encounter (Signed)
 Pt said she called the office yesterday and told someone that [facility] got in her Adderall  yesterday.

## 2023-12-25 ENCOUNTER — Telehealth: Admitting: Physician Assistant

## 2023-12-25 DIAGNOSIS — R3989 Other symptoms and signs involving the genitourinary system: Secondary | ICD-10-CM

## 2023-12-25 MED ORDER — CEPHALEXIN 500 MG PO CAPS: 500.0000 mg | ORAL_CAPSULE | Freq: Two times a day (BID) | ORAL | 0 refills | Status: AC

## 2023-12-25 NOTE — Patient Instructions (Addendum)
 " Grace Butler, thank you for joining [name], PA-C for today's virtual visit.  While this provider is not your primary care provider (PCP), if your PCP is located in our provider database this encounter information will be shared with them immediately following your visit.   A [facility] MyChart account gives you access to today's visit and all your visits, tests, and labs performed at [facility]  click here if you don't have a [facility] MyChart account or go to [email or web address]  Consent: (Patient) Grace Butler provided verbal consent for this virtual visit at the beginning of the encounter.  Current Medications:  Current Outpatient Medications:    ALPRAZolam  (XANAX ) 1 MG tablet, Take 1 tablet (1 mg total) by mouth at bedtime as needed for anxiety., Disp: 30 tablet, Rfl: 5   amitriptyline  (ELAVIL ) 50 MG tablet, Take 1 tablet (50 mg total) by mouth every evening., Disp: 7 tablet, Rfl: 0   amphetamine -dextroamphetamine  (ADDERALL  XR) 30 MG 24 hr capsule, Take 1 capsule (30 mg total) by mouth daily., Disp: 30 capsule, Rfl: 0   amphetamine -dextroamphetamine  (ADDERALL  XR) 30 MG 24 hr capsule, Take 1 capsule (30 mg total) by mouth daily., Disp: 30 capsule, Rfl: 0   amphetamine -dextroamphetamine  (ADDERALL  XR) 30 MG 24 hr capsule, Take 1 capsule (30 mg total) by mouth daily., Disp: 30 capsule, Rfl: 0   amphetamine -dextroamphetamine  (ADDERALL  XR) 30 MG 24 hr capsule, Take 1 capsule (30 mg total) by mouth daily., Disp: 30 capsule, Rfl: 0   amphetamine -dextroamphetamine  (ADDERALL  XR) 30 MG 24 hr capsule, Take 1 capsule (30 mg total) by mouth daily., Disp: 30 capsule, Rfl: 0   amphetamine -dextroamphetamine  (ADDERALL ) 10 MG tablet, Take 1 tablet (10 mg total) by mouth daily with lunch., Disp: 30 tablet, Rfl: 0   amphetamine -dextroamphetamine  (ADDERALL ) 10 MG tablet, Take 1 tablet (10 mg total) by mouth daily with lunch., Disp: 30 tablet, Rfl: 0    amphetamine -dextroamphetamine  (ADDERALL ) 10 MG tablet, Take 1 tablet (10 mg total) by mouth daily with lunch., Disp: 30 tablet, Rfl: 0   ARIPiprazole  (ABILIFY ) 15 MG tablet, TAKE 1 TABLET DAILY, Disp: 90 tablet, Rfl: 0   Ascorbic Acid (VITAMIN C) 100 MG tablet, Take 100 mg by mouth daily., Disp: , Rfl:    butalbital -acetaminophen-caffeine  (FIORICET) 50-325-40 MG tablet, Take 1-2 tablets by mouth every 6 (six) hours as needed for headache. Max 6 per 24 hours, Disp: 14 tablet, Rfl: 0   calcium citrate-vitamin D  (CITRACAL+D) 315-200 MG-UNIT tablet, Take 1 tablet by mouth 2 (two) times daily., Disp: , Rfl:    cholecalciferol (VITAMIN D ) 1000 UNITS tablet, Take 1,000 Units by mouth daily., Disp: , Rfl:    cyclobenzaprine  (FLEXERIL ) 10 MG tablet, Take 1 tablet (10 mg total) by mouth every 8 (eight) hours as needed for muscle spasms., Disp: 20 tablet, Rfl: 0   diclofenac  (VOLTAREN ) 25 MG EC tablet, TAKE 1 TABLET TWICE A DAY AS NEEDED, Disp: 180 tablet, Rfl: 0   estradiol  (CLIMARA  - DOSED IN MG/24 HR) 0.0375 mg/24hr patch, Place 0.0375 mg onto the skin once a week., Disp: , Rfl:    estradiol  (CLIMARA ) 0.0375 mg/24hr patch, Place 1 patch (0.0375 mg total) onto the skin once a week., Disp: 12 patch, Rfl: 3   furosemide  (LASIX ) 20 MG tablet, Take 1 tablet (20 mg total) by mouth daily as needed for leg edema, Disp: 30 tablet, Rfl: 3   gabapentin  (NEURONTIN ) 300 MG capsule, TAKE 1 CAPSULE AT BEDTIME, Disp: 90 capsule, Rfl: 1  hydroxychloroquine  (PLAQUENIL ) 200 MG tablet, TAKE 1 TABLET TWICE A DAY, Disp: 180 tablet, Rfl: 1   levothyroxine  (SYNTHROID ) 175 MCG tablet, TAKE 1 TABLET DAILY BEFORE BREAKFAST, Disp: 90 tablet, Rfl: 3   lisinopril  (ZESTRIL ) 10 MG tablet, Take 1 tablet (10 mg total) by mouth daily., Disp: 90 tablet, Rfl: 3   omeprazole  (PRILOSEC) 40 MG capsule, Take 1 capsule (40 mg total) by mouth in the morning and at bedtime., Disp: 60 capsule, Rfl: 0   predniSONE  (STERAPRED UNI-PAK 21 TAB) 10 MG (21)  TBPK tablet, Take following package directions, Disp: 21 tablet, Rfl: 0   QUEtiapine  (SEROQUEL ) 200 MG tablet, Take 1 tablet (200 mg total) by mouth at bedtime., Disp: 90 tablet, Rfl: 1   QUEtiapine  (SEROQUEL ) 50 MG tablet, Take 50 mg by mouth at bedtime., Disp: , Rfl:    RINVOQ  15 MG TB24, TAKE 1 TABLET DAILY, Disp: 30 tablet, Rfl: 0   rizatriptan  (MAXALT -MLT) 10 MG disintegrating tablet, Take 1 tablet (10 mg total) by mouth as needed for migraine. May repeat in 2 hours if needed.  Office visit is needed please, Disp: 10 tablet, Rfl: 0   Sodium Hyaluronate, oral, (HYALURONIC ACID PO), Take by mouth., Disp: , Rfl:    Specialty Vitamins Products (MAGNESIUM, AMINO ACID CHELATE,) 133 MG tablet, Take 1 tablet by mouth 2 (two) times daily., Disp: , Rfl:    tirzepatide  (ZEPBOUND ) 5 MG/0.5ML Pen, Inject 5 mg into the skin once a week., Disp: 6 mL, Rfl: 2   valACYclovir  (VALTREX ) 1000 MG tablet, Take 1 tablet (1,000 mg total) by mouth daily. Take for 5 days for outbreak as needed, Disp: 30 tablet, Rfl: 1   Zoster Vaccine Adjuvanted (SHINGRIX ) injection, Inject into the muscle., Disp: 1 mL, Rfl: 0   Medications ordered in this encounter:  No orders of the defined types were placed in this encounter.    *If you need refills on other medications prior to your next appointment, please contact your pharmacy*  Follow-Up: Call back or seek an in-person evaluation if the symptoms worsen or if the condition fails to improve as anticipated.  [facility] [phone]  Other Instructions Your symptoms are consistent with a bladder infection, also called acute cystitis. Please take your antibiotic (Keflex ) as directed until all pills are gone.  Stay very well hydrated.  Consider a daily probiotic (Align, Culturelle, or Activia) to help prevent stomach upset caused by the antibiotic.  Taking a probiotic daily may also help prevent recurrent UTIs.  Also consider taking AZO (Phenazopyridine) tablets to  help decrease pain with urination.  If you note any non-resolving, new, or worsening symptoms despite treatment, please seek an in-person evaluation ASAP.   Urinary Tract Infection A urinary tract infection (UTI) can occur any place along the urinary tract. The tract includes the kidneys, ureters, bladder, and urethra. A type of germ called bacteria often causes a UTI. UTIs are often helped with antibiotic medicine.  HOME CARE  If given, take antibiotics as told by your doctor. Finish them even if you start to feel better. Drink enough fluids to keep your pee (urine) clear or pale yellow. Avoid tea, drinks with caffeine , and bubbly (carbonated) drinks. Pee often. Avoid holding your pee in for a long time. Pee before and after having sex (intercourse). Wipe from front to back after you poop (bowel movement) if you are a woman. Use each tissue only once. GET HELP RIGHT AWAY IF:  You have back pain. You have lower  belly (abdominal) pain. You have chills. You feel sick to your stomach (nauseous). You throw up (vomit). Your burning or discomfort with peeing does not go away. You have a fever. Your symptoms are not better in 3 days. MAKE SURE YOU:  Understand these instructions. Will watch your condition. Will get help right away if you are not doing well or get worse. Document Released: 06/08/2007 Document Revised: 09/14/2011 Document Reviewed: 07/21/2011 [facility] Patient Information 2015 [place], [place]. This information is not intended to replace advice given to you by your health care provider. Make sure you discuss any questions you have with your health care provider.    If you have been instructed to have an in-person evaluation today at a local Urgent Care facility, please use the link below. It will take you to a list of all of our available [facility], including address, phone number and hours of operation. Please do not delay care.  [facility]  If you  or a family member do not have a primary care provider, use the link below to schedule a visit and establish care. When you choose a [facility] primary care physician or advanced practice provider, you gain a long-term partner in health. Find a Primary Care Provider  Learn more about [facility] in-office and virtual care options: [facility] - Get Care Now  "

## 2023-12-25 NOTE — Progress Notes (Signed)
 " Virtual Visit Consent   Grace Butler, you are scheduled for a virtual visit with a [facility] provider today. Just as with appointments in the office, your consent must be obtained to participate. Your consent will be active for this visit and any virtual visit you may have with one of our providers in the next 365 days. If you have a MyChart account, a copy of this consent can be sent to you electronically.  As this is a virtual visit, video technology does not allow for your provider to perform a traditional examination. This may limit your provider's ability to fully assess your condition. If your provider identifies any concerns that need to be evaluated in person or the need to arrange testing (such as labs, EKG, etc.), we will make arrangements to do so. Although advances in technology are sophisticated, we cannot ensure that it will always work on either your end or our end. If the connection with a video visit is poor, the visit may have to be switched to a telephone visit. With either a video or telephone visit, we are not always able to ensure that we have a secure connection.  By engaging in this virtual visit, you consent to the provision of healthcare and authorize for your insurance to be billed (if applicable) for the services provided during this visit. Depending on your insurance coverage, you may receive a charge related to this service.  I need to obtain your verbal consent now. Are you willing to proceed with your visit today? Grace Butler has provided verbal consent on 12/25/2023 for a virtual visit (video or telephone). Grace Butler, [place]  Date: 12/25/2023 11:18 AM   Virtual Visit via Video Note   I, Grace Butler, connected with  Grace Butler  ([id number], 1970/10/10) on 12/25/2023 at 11:15 AM EST by a video-enabled telemedicine application and verified that I am speaking with the correct person using two identifiers.  Location: Patient: Virtual Visit  Location Patient: Home Provider: Virtual Visit Location Provider: Home Office   I discussed the limitations of evaluation and management by telemedicine and the availability of in person appointments. The patient expressed understanding and agreed to proceed.    History of Present Illness: Grace Butler is a 53 y.o. who identifies as a female who was assigned female at birth, and is being seen today for possible UTI. Sunday with dysuria, urgency, hesitancy, Since yesterday morning with increased urgency/frequency. Not sexually active.   HPI: HPI  Problems:  Patient Active Problem List   Diagnosis Date Noted   High risk medication use 07/29/2020   Attention deficit hyperactivity disorder (ADHD) 09/28/2017   Insomnia, unspecified 09/28/2017   Acute meniscal tear of knee, left, subsequent encounter 02/10/2017   Left knee pain 09/01/2016   Obesity 01/14/2016   Rheumatoid arthritis (HCC) 01/14/2016   Hypothyroidism due to acquired atrophy of thyroid  01/14/2016   Osteopenia 09/15/2015   Bipolar 1 disorder (HCC) 08/21/2015   Gastroesophageal reflux disease without esophagitis 08/21/2015   Migraine 08/21/2015    Allergies: Allergies[1] Medications: Current Medications[2]  Observations/Objective: Patient is well-developed, well-nourished in no acute distress.  Resting comfortably  at home.  Head is normocephalic, atraumatic.  No labored breathing.  Speech is clear and coherent with logical content.  Patient is alert and oriented at baseline.   Assessment and Plan: 1. Suspected UTI (Primary) - cephALEXin  (KEFLEX ) 500 MG capsule; Take 1 capsule (500 mg total) by mouth 2 (two) times daily for 7  days.  Dispense: 14 capsule; Refill: 0  Classic UTI symptoms with absence of alarm signs or symptoms. Prior history of UTI but has been some time. Will treat empirically with Keflex  for suspected uncomplicated cystitis. Supportive measures and OTC medications reviewed. Strict in-person evaluation  precautions discussed.    Follow Up Instructions: I discussed the assessment and treatment plan with the patient. The patient was provided an opportunity to ask questions and all were answered. The patient agreed with the plan and demonstrated an understanding of the instructions.  A copy of instructions were sent to the patient via MyChart unless otherwise noted below.   The patient was advised to call back or seek an in-person evaluation if the symptoms worsen or if the condition fails to improve as anticipated.    Grace Velma Lunger, PA-C    [1]  Allergies Allergen Reactions   Codeine    Sulfa Antibiotics Hives   Trazodone And Nefazodone     Vivid dreams  [2]  Current Outpatient Medications:    cephALEXin  (KEFLEX ) 500 MG capsule, Take 1 capsule (500 mg total) by mouth 2 (two) times daily for 7 days., Disp: 14 capsule, Rfl: 0   ALPRAZolam  (XANAX ) 1 MG tablet, Take 1 tablet (1 mg total) by mouth at bedtime as needed for anxiety., Disp: 30 tablet, Rfl: 5   amitriptyline  (ELAVIL ) 50 MG tablet, Take 1 tablet (50 mg total) by mouth every evening., Disp: 7 tablet, Rfl: 0   amphetamine -dextroamphetamine  (ADDERALL  XR) 30 MG 24 hr capsule, Take 1 capsule (30 mg total) by mouth daily., Disp: 30 capsule, Rfl: 0   amphetamine -dextroamphetamine  (ADDERALL  XR) 30 MG 24 hr capsule, Take 1 capsule (30 mg total) by mouth daily., Disp: 30 capsule, Rfl: 0   amphetamine -dextroamphetamine  (ADDERALL  XR) 30 MG 24 hr capsule, Take 1 capsule (30 mg total) by mouth daily., Disp: 30 capsule, Rfl: 0   amphetamine -dextroamphetamine  (ADDERALL  XR) 30 MG 24 hr capsule, Take 1 capsule (30 mg total) by mouth daily., Disp: 30 capsule, Rfl: 0   amphetamine -dextroamphetamine  (ADDERALL  XR) 30 MG 24 hr capsule, Take 1 capsule (30 mg total) by mouth daily., Disp: 30 capsule, Rfl: 0   amphetamine -dextroamphetamine  (ADDERALL ) 10 MG tablet, Take 1 tablet (10 mg total) by mouth daily with lunch., Disp: 30 tablet, Rfl: 0    amphetamine -dextroamphetamine  (ADDERALL ) 10 MG tablet, Take 1 tablet (10 mg total) by mouth daily with lunch., Disp: 30 tablet, Rfl: 0   amphetamine -dextroamphetamine  (ADDERALL ) 10 MG tablet, Take 1 tablet (10 mg total) by mouth daily with lunch., Disp: 30 tablet, Rfl: 0   ARIPiprazole  (ABILIFY ) 15 MG tablet, TAKE 1 TABLET DAILY, Disp: 90 tablet, Rfl: 0   Ascorbic Acid (VITAMIN C) 100 MG tablet, Take 100 mg by mouth daily., Disp: , Rfl:    butalbital -acetaminophen-caffeine  (FIORICET) 50-325-40 MG tablet, Take 1-2 tablets by mouth every 6 (six) hours as needed for headache. Max 6 per 24 hours, Disp: 14 tablet, Rfl: 0   calcium citrate-vitamin D  (CITRACAL+D) 315-200 MG-UNIT tablet, Take 1 tablet by mouth 2 (two) times daily., Disp: , Rfl:    cholecalciferol (VITAMIN D ) 1000 UNITS tablet, Take 1,000 Units by mouth daily., Disp: , Rfl:    cyclobenzaprine  (FLEXERIL ) 10 MG tablet, Take 1 tablet (10 mg total) by mouth every 8 (eight) hours as needed for muscle spasms., Disp: 20 tablet, Rfl: 0   diclofenac  (VOLTAREN ) 25 MG EC tablet, TAKE 1 TABLET TWICE A DAY AS NEEDED, Disp: 180 tablet, Rfl: 0   estradiol  (CLIMARA  -  DOSED IN MG/24 HR) 0.0375 mg/24hr patch, Place 0.0375 mg onto the skin once a week., Disp: , Rfl:    estradiol  (CLIMARA ) 0.0375 mg/24hr patch, Place 1 patch (0.0375 mg total) onto the skin once a week., Disp: 12 patch, Rfl: 3   furosemide  (LASIX ) 20 MG tablet, Take 1 tablet (20 mg total) by mouth daily as needed for leg edema, Disp: 30 tablet, Rfl: 3   gabapentin  (NEURONTIN ) 300 MG capsule, TAKE 1 CAPSULE AT BEDTIME, Disp: 90 capsule, Rfl: 1   hydroxychloroquine  (PLAQUENIL ) 200 MG tablet, TAKE 1 TABLET TWICE A DAY, Disp: 180 tablet, Rfl: 1   levothyroxine  (SYNTHROID ) 175 MCG tablet, TAKE 1 TABLET DAILY BEFORE BREAKFAST, Disp: 90 tablet, Rfl: 3   lisinopril  (ZESTRIL ) 10 MG tablet, Take 1 tablet (10 mg total) by mouth daily., Disp: 90 tablet, Rfl: 3   omeprazole  (PRILOSEC) 40 MG capsule, Take 1  capsule (40 mg total) by mouth in the morning and at bedtime., Disp: 60 capsule, Rfl: 0   predniSONE  (STERAPRED UNI-PAK 21 TAB) 10 MG (21) TBPK tablet, Take following package directions, Disp: 21 tablet, Rfl: 0   QUEtiapine  (SEROQUEL ) 200 MG tablet, Take 1 tablet (200 mg total) by mouth at bedtime., Disp: 90 tablet, Rfl: 1   QUEtiapine  (SEROQUEL ) 50 MG tablet, Take 50 mg by mouth at bedtime., Disp: , Rfl:    RINVOQ  15 MG TB24, TAKE 1 TABLET DAILY, Disp: 30 tablet, Rfl: 0   rizatriptan  (MAXALT -MLT) 10 MG disintegrating tablet, Take 1 tablet (10 mg total) by mouth as needed for migraine. May repeat in 2 hours if needed.  Office visit is needed please, Disp: 10 tablet, Rfl: 0   Sodium Hyaluronate, oral, (HYALURONIC ACID PO), Take by mouth., Disp: , Rfl:    Specialty Vitamins Products (MAGNESIUM, AMINO ACID CHELATE,) 133 MG tablet, Take 1 tablet by mouth 2 (two) times daily., Disp: , Rfl:    tirzepatide  (ZEPBOUND ) 5 MG/0.5ML Pen, Inject 5 mg into the skin once a week., Disp: 6 mL, Rfl: 2   valACYclovir  (VALTREX ) 1000 MG tablet, Take 1 tablet (1,000 mg total) by mouth daily. Take for 5 days for outbreak as needed, Disp: 30 tablet, Rfl: 1   Zoster Vaccine Adjuvanted (SHINGRIX ) injection, Inject into the muscle., Disp: 1 mL, Rfl: 0  "

## 2023-12-28 ENCOUNTER — Other Ambulatory Visit (HOSPITAL_COMMUNITY): Payer: Self-pay

## 2023-12-29 ENCOUNTER — Other Ambulatory Visit (HOSPITAL_COMMUNITY): Payer: Self-pay

## 2023-12-29 ENCOUNTER — Other Ambulatory Visit: Payer: Self-pay

## 2023-12-29 MED ORDER — ESTRADIOL 0.0375 MG/24HR TD PTWK
0.0375 mg | MEDICATED_PATCH | TRANSDERMAL | 0 refills | Status: AC
  Filled 2023-12-29 – 2024-01-11 (×3): qty 12, 84d supply, fill #0
  Filled ????-??-??: fill #0

## 2024-01-01 ENCOUNTER — Other Ambulatory Visit: Payer: Self-pay

## 2024-01-01 ENCOUNTER — Telehealth: Payer: Self-pay | Admitting: Internal Medicine

## 2024-01-01 DIAGNOSIS — Z9225 Personal history of immunosupression therapy: Secondary | ICD-10-CM

## 2024-01-01 DIAGNOSIS — Z79899 Other long term (current) drug therapy: Secondary | ICD-10-CM

## 2024-01-01 DIAGNOSIS — Z111 Encounter for screening for respiratory tuberculosis: Secondary | ICD-10-CM

## 2024-01-01 NOTE — Telephone Encounter (Signed)
Labs released per patient request

## 2024-01-01 NOTE — Telephone Encounter (Signed)
 Patient contacted the office requesting lab orders to be be release to [facility] on [address] in [place].   Patient plans to have labs on Tuesday, 01/02/24.

## 2024-01-03 ENCOUNTER — Other Ambulatory Visit: Payer: Self-pay

## 2024-01-04 ENCOUNTER — Encounter: Payer: Self-pay | Admitting: Family Medicine

## 2024-01-06 ENCOUNTER — Other Ambulatory Visit: Payer: Self-pay | Admitting: Physician Assistant

## 2024-01-06 DIAGNOSIS — F902 Attention-deficit hyperactivity disorder, combined type: Secondary | ICD-10-CM

## 2024-01-07 NOTE — Telephone Encounter (Signed)
 LF 12/11, due 12/8

## 2024-01-08 ENCOUNTER — Other Ambulatory Visit (HOSPITAL_COMMUNITY): Payer: Self-pay

## 2024-01-08 ENCOUNTER — Telehealth: Payer: Self-pay | Admitting: Physician Assistant

## 2024-01-08 NOTE — Telephone Encounter (Signed)
 Pt called requesting Adderall   XR 30 mg  to [facility]. F/U due March

## 2024-01-08 NOTE — Telephone Encounter (Signed)
 Due 1/28

## 2024-01-10 ENCOUNTER — Other Ambulatory Visit (HOSPITAL_COMMUNITY): Payer: Self-pay

## 2024-01-10 MED ORDER — AMPHETAMINE-DEXTROAMPHET ER 30 MG PO CP24: 30.0000 mg | ORAL_CAPSULE | Freq: Every day | ORAL | 0 refills | Status: AC

## 2024-01-10 MED ORDER — AMPHETAMINE-DEXTROAMPHET ER 30 MG PO CP24
30.0000 mg | ORAL_CAPSULE | Freq: Every day | ORAL | 0 refills | Status: AC
  Filled 2024-01-12: qty 30, 30d supply, fill #0

## 2024-01-11 ENCOUNTER — Other Ambulatory Visit: Payer: Self-pay

## 2024-01-11 ENCOUNTER — Other Ambulatory Visit (HOSPITAL_COMMUNITY): Payer: Self-pay

## 2024-01-11 NOTE — Telephone Encounter (Signed)
 Has RF available.

## 2024-01-12 ENCOUNTER — Other Ambulatory Visit (HOSPITAL_COMMUNITY): Payer: Self-pay

## 2024-01-12 NOTE — Progress Notes (Unsigned)
 "  Office Visit Note  Patient: Grace Butler             Date of Birth: 1970/05/03           MRN: [id number]             Visit Date: 01/15/2024  PCP: Watt Harlene BROCKS, MD   Subjective:   Chief Complaint: No chief complaint on file.   History of Present Illness: Grace Butler is a 54 y.o. female who returns today for follow up of seronegative rheumatoid arthritis. She is feeling ***. She is taking Rinvoq  15 mg and hydroxychloroquine  400 mg daily She is tolerating medications without any side effects.   Otherwise, patient has *** had any other significant health changes since last visit.   Previous Medication Trials: methotrexate (elevated LFTs), Humira, Enbrel , Xeljanz, Hydroxychloroquine   Last Labs: CBC/CMP WNL, Last Quantiferon gold in 2024.  Review of Systems: No Rheumatology ROS completed.    Objective: Vital Signs: There were no vitals taken for this visit.  Physical Exam Vitals reviewed.  Constitutional:      General: She is not in acute distress.    Appearance: Normal appearance. She is well-developed and normal weight.  HENT:     Head: Normocephalic and atraumatic. No right periorbital erythema or left periorbital erythema.     Mouth/Throat:     Mouth: Mucous membranes are moist.     Pharynx: Oropharynx is clear.  Eyes:     General: Lids are normal.        Right eye: No discharge.        Left eye: No discharge.     Extraocular Movements: Extraocular movements intact.     Conjunctiva/sclera: Conjunctivae normal.     Pupils: Pupils are equal, round, and reactive to light.  Cardiovascular:     Rate and Rhythm: Normal rate and regular rhythm.     Heart sounds: Normal heart sounds. No murmur heard.    No friction rub. No gallop.  Pulmonary:     Effort: Pulmonary effort is normal. No respiratory distress.     Breath sounds: Normal breath sounds. No stridor. No wheezing, rhonchi or rales.  Chest:     Chest wall: No deformity.  Musculoskeletal:     Right lower  leg: No edema.     Left lower leg: No edema.     Comments: Synovitis present in ***. Tenderness noted in ***.  C-spine, thoracic spine, lumbar spine have *** range of motion. *** SI joint tenderness. Shoulder joints, elbow joints, wrist joints, MCPs, PIPs, DIPs have *** range of motion. *** fist formation bilaterally.  Hip joints have good range of motion with *** pain with internal and external rotation.  Knee joints have *** range of motion with no warmth, effusion, or crepitus. Ankle joints have *** range of motion with no tenderness or joint swelling. *** evidence of Achilles tendinitis or plantar fasciitis. *** MTP squeeze.   Lymphadenopathy:     Cervical: No cervical adenopathy.  Skin:    General: Skin is warm and moist.     Capillary Refill: Capillary refill takes less than 2 seconds.     Findings: No rash.  Neurological:     Mental Status: She is alert.     Gait: Gait normal.  Psychiatric:        Mood and Affect: Mood normal.        Behavior: Behavior normal.      Imaging: No results found.  Problem List:  Patient Active Problem List   Diagnosis Date Noted   High risk medication use 07/29/2020   Attention deficit hyperactivity disorder (ADHD) 09/28/2017   Insomnia, unspecified 09/28/2017   Acute meniscal tear of knee, left, subsequent encounter 02/10/2017   Left knee pain 09/01/2016   Obesity 01/14/2016   Rheumatoid arthritis (HCC) 01/14/2016   Hypothyroidism due to acquired atrophy of thyroid  01/14/2016   Osteopenia 09/15/2015   Bipolar 1 disorder (HCC) 08/21/2015   Gastroesophageal reflux disease without esophagitis 08/21/2015   Migraine 08/21/2015    Medication List: Current Medications[1]   PMFS History:  History: Past Medical History:  Diagnosis Date   Bipolar 1 disorder (HCC)    Chronic headache    Depression    Gallstone    Hypertension    IBS (irritable bowel syndrome)    Obesity    Rheumatoid arthritis (HCC)    Thyroid  disease    Ulcer      Family History  Problem Relation Age of Onset   Hypertension Mother    Heart attack Father    Healthy Sister    Healthy Sister    Healthy Sister    Healthy Brother    Healthy Son    Rheum arthritis Paternal Uncle    Colon cancer Neg Hx    Esophageal cancer Neg Hx    Colon polyps Neg Hx    Rectal cancer Neg Hx    Stomach cancer Neg Hx    Past Surgical History:  Procedure Laterality Date   ABDOMINAL HYSTERECTOMY     APPENDECTOMY     CHOLECYSTECTOMY     COLONOSCOPY  09/07/2018   CYSTECTOMY     dermoid   ESOPHAGOGASTRODUODENOSCOPY     about 20 years ago to check for ulcers   KNEE ARTHROSCOPY Left 2019   MYOMECTOMY     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 23   UPPER GASTROINTESTINAL ENDOSCOPY     Social History   Social History Narrative   Not on file   Allergies: Codeine, Sulfa antibiotics, and Trazodone and nefazodone  Immunization status:  Immunization History  Administered Date(s) Administered   Influenza,inj,Quad PF,6+ Mos 01/14/2016, 09/13/2017   Influenza-Unspecified 01/14/2016, 10/31/2016, 09/13/2017, 12/17/2017, 08/06/2018   Pneumococcal Conjugate-13 03/24/2014, 05/26/2014   Pneumococcal Polysaccharide-23 05/26/2014   Pneumococcal-Unspecified 05/26/2014   Tdap 11/10/2017   Zoster Recombinant(Shingrix ) 01/31/2022     Assessment:  Visit Diagnoses: Seronegative rheumatoid arthritis (HCC)  High risk medication use  Seronegative rheumatoid arthritis (HCC) Well-controlled. *** synovitis present or joint pain. Currently on Rinvoq  and Plaquenil . Next eye exam on ***. If continue to do well in follow-up might be able to discontinue a medicine such as hydroxychloroquine . -Continue Rinvoq  15 mg p.o. daily -Continue hydroxychloroquine  400 mg daily -Continue diclofenac  25 mg twice daily as needed  High risk medication use This patient is on drug therapy requiring intensive monitoring for toxicity, including regular lab monitoring and screening for serious or recurrent  infections. Today, I assessed for side effects, infections, new or worsening health conditions that may be related to the medications, and will obtain labs.  Follow-Up Instructions:  No follow-ups on file.   Orders: No orders of the defined types were placed in this encounter.   No orders of the defined types were placed in this encounter.    Daved GORMAN Holstein, PA-C  Note - This record has been created using Animal nutritionist. Chart creation errors have been sought, but may not always have been located. Such creation errors do not reflect  on the standard of medical care.     [1]  Current Outpatient Medications:    ALPRAZolam  (XANAX ) 1 MG tablet, Take 1 tablet (1 mg total) by mouth at bedtime as needed for anxiety., Disp: 30 tablet, Rfl: 5   amitriptyline  (ELAVIL ) 50 MG tablet, Take 1 tablet (50 mg total) by mouth every evening., Disp: 7 tablet, Rfl: 0   amphetamine -dextroamphetamine  (ADDERALL  XR) 30 MG 24 hr capsule, Take 1 capsule (30 mg total) by mouth daily., Disp: 30 capsule, Rfl: 0   amphetamine -dextroamphetamine  (ADDERALL  XR) 30 MG 24 hr capsule, Take 1 capsule (30 mg total) by mouth daily., Disp: 30 capsule, Rfl: 0   amphetamine -dextroamphetamine  (ADDERALL  XR) 30 MG 24 hr capsule, Take 1 capsule (30 mg total) by mouth daily., Disp: 30 capsule, Rfl: 0   amphetamine -dextroamphetamine  (ADDERALL  XR) 30 MG 24 hr capsule, Take 1 capsule (30 mg total) by mouth daily., Disp: 30 capsule, Rfl: 0   amphetamine -dextroamphetamine  (ADDERALL  XR) 30 MG 24 hr capsule, Take 1 capsule (30 mg total) by mouth daily., Disp: 30 capsule, Rfl: 0   [START ON 02/09/2024] amphetamine -dextroamphetamine  (ADDERALL  XR) 30 MG 24 hr capsule, Take 1 capsule (30 mg total) by mouth daily., Disp: 30 capsule, Rfl: 0   [START ON 03/08/2024] amphetamine -dextroamphetamine  (ADDERALL  XR) 30 MG 24 hr capsule, Take 1 capsule (30 mg total) by mouth daily., Disp: 30 capsule, Rfl: 0   amphetamine -dextroamphetamine  (ADDERALL ) 10 MG tablet,  Take 1 tablet (10 mg total) by mouth daily with lunch., Disp: 30 tablet, Rfl: 0   amphetamine -dextroamphetamine  (ADDERALL ) 10 MG tablet, Take 1 tablet (10 mg total) by mouth daily with lunch., Disp: 30 tablet, Rfl: 0   amphetamine -dextroamphetamine  (ADDERALL ) 10 MG tablet, Take 1 tablet (10 mg total) by mouth daily with lunch., Disp: 30 tablet, Rfl: 0   ARIPiprazole  (ABILIFY ) 15 MG tablet, TAKE 1 TABLET DAILY, Disp: 90 tablet, Rfl: 0   Ascorbic Acid (VITAMIN C) 100 MG tablet, Take 100 mg by mouth daily., Disp: , Rfl:    butalbital -acetaminophen-caffeine  (FIORICET) 50-325-40 MG tablet, Take 1-2 tablets by mouth every 6 (six) hours as needed for headache. Max 6 per 24 hours, Disp: 14 tablet, Rfl: 0   calcium citrate-vitamin D  (CITRACAL+D) 315-200 MG-UNIT tablet, Take 1 tablet by mouth 2 (two) times daily., Disp: , Rfl:    cholecalciferol (VITAMIN D ) 1000 UNITS tablet, Take 1,000 Units by mouth daily., Disp: , Rfl:    cyclobenzaprine  (FLEXERIL ) 10 MG tablet, Take 1 tablet (10 mg total) by mouth every 8 (eight) hours as needed for muscle spasms., Disp: 20 tablet, Rfl: 0   diclofenac  (VOLTAREN ) 25 MG EC tablet, TAKE 1 TABLET TWICE A DAY AS NEEDED, Disp: 180 tablet, Rfl: 0   estradiol  (CLIMARA  - DOSED IN MG/24 HR) 0.0375 mg/24hr patch, Place 0.0375 mg onto the skin once a week., Disp: , Rfl:    estradiol  (CLIMARA  - DOSED IN MG/24 HR) 0.0375 mg/24hr patch, Place 1 patch (0.0375 mg total) onto the skin once a week., Disp: 12 patch, Rfl: 0   furosemide  (LASIX ) 20 MG tablet, Take 1 tablet (20 mg total) by mouth daily as needed for leg edema, Disp: 30 tablet, Rfl: 3   gabapentin  (NEURONTIN ) 300 MG capsule, TAKE 1 CAPSULE AT BEDTIME, Disp: 90 capsule, Rfl: 1   hydroxychloroquine  (PLAQUENIL ) 200 MG tablet, TAKE 1 TABLET TWICE A DAY, Disp: 180 tablet, Rfl: 1   levothyroxine  (SYNTHROID ) 175 MCG tablet, TAKE 1 TABLET DAILY BEFORE BREAKFAST, Disp: 90 tablet, Rfl: 3   lisinopril  (ZESTRIL )  10 MG tablet, Take 1 tablet  (10 mg total) by mouth daily., Disp: 90 tablet, Rfl: 3   omeprazole  (PRILOSEC) 40 MG capsule, Take 1 capsule (40 mg total) by mouth in the morning and at bedtime., Disp: 60 capsule, Rfl: 0   predniSONE  (STERAPRED UNI-PAK 21 TAB) 10 MG (21) TBPK tablet, Take following package directions, Disp: 21 tablet, Rfl: 0   QUEtiapine  (SEROQUEL ) 200 MG tablet, Take 1 tablet (200 mg total) by mouth at bedtime., Disp: 90 tablet, Rfl: 1   QUEtiapine  (SEROQUEL ) 50 MG tablet, Take 50 mg by mouth at bedtime., Disp: , Rfl:    RINVOQ  15 MG TB24, TAKE 1 TABLET DAILY, Disp: 30 tablet, Rfl: 0   rizatriptan  (MAXALT -MLT) 10 MG disintegrating tablet, Take 1 tablet (10 mg total) by mouth as needed for migraine. May repeat in 2 hours if needed.  Office visit is needed please, Disp: 10 tablet, Rfl: 0   Sodium Hyaluronate, oral, (HYALURONIC ACID PO), Take by mouth., Disp: , Rfl:    Specialty Vitamins Products (MAGNESIUM, AMINO ACID CHELATE,) 133 MG tablet, Take 1 tablet by mouth 2 (two) times daily., Disp: , Rfl:    tirzepatide  (ZEPBOUND ) 5 MG/0.5ML Pen, Inject 5 mg into the skin once a week., Disp: 6 mL, Rfl: 2   valACYclovir  (VALTREX ) 1000 MG tablet, Take 1 tablet (1,000 mg total) by mouth daily. Take for 5 days for outbreak as needed, Disp: 30 tablet, Rfl: 1   Zoster Vaccine Adjuvanted (SHINGRIX ) injection, Inject into the muscle., Disp: 1 mL, Rfl: 0  "

## 2024-01-15 ENCOUNTER — Ambulatory Visit: Payer: Self-pay

## 2024-01-15 VITALS — BP 107/74 | HR 78 | Temp 97.0°F | Resp 16 | Ht 68.0 in | Wt 276.4 lb

## 2024-01-15 DIAGNOSIS — F3289 Other specified depressive episodes: Secondary | ICD-10-CM | POA: Diagnosis not present

## 2024-01-15 DIAGNOSIS — F32A Depression, unspecified: Secondary | ICD-10-CM | POA: Insufficient documentation

## 2024-01-15 DIAGNOSIS — M0600 Rheumatoid arthritis without rheumatoid factor, unspecified site: Secondary | ICD-10-CM | POA: Diagnosis not present

## 2024-01-15 DIAGNOSIS — Z79899 Other long term (current) drug therapy: Secondary | ICD-10-CM | POA: Diagnosis not present

## 2024-01-15 MED ORDER — RINVOQ 15 MG PO TB24: 15.0000 mg | ORAL_TABLET | Freq: Every day | ORAL | 1 refills | Status: AC

## 2024-01-15 NOTE — Assessment & Plan Note (Signed)
 Well-controlled. No synovitis present or joint pain. Currently on Rinvoq  and Plaquenil . Discussed stopping Plaquenil , but patient prefers to stay on it during the winter months. We will discuss this again at her next visit.  -Continue Rinvoq  15 mg p.o. daily -Continue hydroxychloroquine  400 mg daily -Continue diclofenac  25 mg twice daily as needed Orders:   CBC with Differential/Platelet   Comprehensive metabolic panel with GFR   QuantiFERON-TB Gold Plus

## 2024-01-15 NOTE — Assessment & Plan Note (Signed)
 This patient is on drug therapy requiring intensive monitoring for toxicity, including regular lab monitoring and screening for serious or recurrent infections. Today, I assessed for side effects, infections, new or worsening health conditions that may be related to the medications, and will obtain labs. Orders:   CBC with Differential/Platelet   Comprehensive metabolic panel with GFR   QuantiFERON-TB Gold Plus

## 2024-01-23 ENCOUNTER — Telehealth: Payer: Self-pay

## 2024-01-23 ENCOUNTER — Telehealth: Payer: Self-pay | Admitting: Physician Assistant

## 2024-01-23 ENCOUNTER — Other Ambulatory Visit: Payer: Self-pay

## 2024-01-23 DIAGNOSIS — M0600 Rheumatoid arthritis without rheumatoid factor, unspecified site: Secondary | ICD-10-CM

## 2024-01-23 MED ORDER — DICLOFENAC SODIUM 25 MG PO TBEC: 25.0000 mg | DELAYED_RELEASE_TABLET | Freq: Two times a day (BID) | ORAL | 0 refills | Status: AC | PRN

## 2024-01-23 MED ORDER — ARIPIPRAZOLE 15 MG PO TABS: 15.0000 mg | ORAL_TABLET | Freq: Every day | ORAL | 0 refills | Status: DC

## 2024-01-23 NOTE — Telephone Encounter (Signed)
 Patient contacted the office and states she has switched insurances and will need a new PA for her Rinvoq  and that it should be sent to [facility]. Patient states her new insurance information is:  [employer] Rx BIN: [id number] Rx PCN: [id number] Rx Group: [id number] Member Number: [id number] Group Number: [id number] Plan Number: [id number]  Please advise.

## 2024-01-23 NOTE — Telephone Encounter (Signed)
 Pharmacy is [facility]. Updated pharmacy profile. Sent Abilify .

## 2024-01-23 NOTE — Telephone Encounter (Signed)
 Grace Butler called and gave us  her new insurance information. She also is using a new pharmacy. [facility] [address] [place] ,[place] . She never picked up the [facility]  [facility]. Please cancel and send to new pharmacy

## 2024-01-23 NOTE — Telephone Encounter (Signed)
 Submitted a Prior Authorization request to [id number] for RINVOQ  via CoverMyMeds. Will update once we receive a response.  Key: [id number]

## 2024-01-23 NOTE — Telephone Encounter (Signed)
 Patient contacted the office and requests a refill of diclofenac  be sent to [facility]. Patient states the refill from December was never filled by her pharmacy so she never got it.   Last Fill: 12/13/2023 (never picked up or filled)  Labs: 06/28/2023  ALT 32  Next Visit: 07/15/2024  Last Visit: 01/15/2024  DX: Seronegative rheumatoid arthritis (HCC)   Current Dose per office note 01/15/2024: diclofenac  25 mg twice daily as needed   Patient does know she needs to get labs still.   Okay to refill Diclofenac ?

## 2024-01-24 ENCOUNTER — Other Ambulatory Visit (HOSPITAL_COMMUNITY): Payer: Self-pay

## 2024-01-24 ENCOUNTER — Other Ambulatory Visit: Payer: Self-pay | Admitting: Family Medicine

## 2024-01-24 DIAGNOSIS — I10 Essential (primary) hypertension: Secondary | ICD-10-CM

## 2024-01-24 NOTE — Telephone Encounter (Signed)
 Received notification from [facility] regarding a prior authorization for RINVOQ . Authorization has been APPROVED from 01/23/2024 to 01/22/2025. Approval letter sent to scan center.  Patient must fill through [facility].  Authorization # [id number]  Rx can be sent to pharmacy after labs are updated. Looks like trhese were ordered by Daved Holstein on 1/12/226 but potentially not drawn.   Sherry Pennant, PharmD, MPH, BCPS, CPP Clinical Pharmacist

## 2024-01-24 NOTE — Telephone Encounter (Signed)
 Patient has not had her labs done. Attempted to contact the patient and left message to advise patient she needs to come to the office to have those done before we will be able to send any refills.

## 2024-01-29 ENCOUNTER — Other Ambulatory Visit: Payer: Self-pay | Admitting: Physician Assistant

## 2024-01-29 DIAGNOSIS — G43909 Migraine, unspecified, not intractable, without status migrainosus: Secondary | ICD-10-CM

## 2024-01-30 ENCOUNTER — Other Ambulatory Visit (HOSPITAL_COMMUNITY): Payer: Self-pay

## 2024-01-30 ENCOUNTER — Encounter: Payer: Self-pay | Admitting: *Deleted

## 2024-01-30 ENCOUNTER — Telehealth: Payer: Self-pay | Admitting: Physician Assistant

## 2024-01-30 MED ORDER — ARIPIPRAZOLE 15 MG PO TABS
15.0000 mg | ORAL_TABLET | Freq: Every day | ORAL | 0 refills | Status: AC
  Filled 2024-01-30 – 2024-01-31 (×3): qty 30, 30d supply, fill #0

## 2024-01-30 MED ORDER — QUETIAPINE FUMARATE 50 MG PO TABS
50.0000 mg | ORAL_TABLET | Freq: Every day | ORAL | 0 refills | Status: AC
  Filled 2024-01-30: qty 30, 30d supply, fill #0

## 2024-01-30 MED ORDER — QUETIAPINE FUMARATE 200 MG PO TABS
200.0000 mg | ORAL_TABLET | Freq: Every day | ORAL | 0 refills | Status: AC
  Filled 2024-01-30: qty 30, 30d supply, fill #0

## 2024-01-30 NOTE — Telephone Encounter (Signed)
 Attempted to contact the patient and left a message advising she needs to update labs.

## 2024-01-30 NOTE — Telephone Encounter (Signed)
 Mychart message sent to patient.

## 2024-01-30 NOTE — Telephone Encounter (Signed)
 Pt notified of RF, that Xanax  not due yet.

## 2024-01-30 NOTE — Telephone Encounter (Addendum)
 Sent 30-day supply of Abilify  and Seroquel  to [facility]. Xanax  LF 1/9, due 2/7.

## 2024-01-30 NOTE — Telephone Encounter (Signed)
 Patient called in stating that her insurance was recently changed and taking a while to cover her medications. She asked if a one month prescription could be filled for Xanax  1mg , Abilify  15mg  and Seroquel  250mg . PH: [phone] Pharmacy [facility] [address] [place], [place]

## 2024-01-31 ENCOUNTER — Other Ambulatory Visit (HOSPITAL_COMMUNITY): Payer: Self-pay

## 2024-01-31 ENCOUNTER — Other Ambulatory Visit: Payer: Self-pay

## 2024-02-09 ENCOUNTER — Other Ambulatory Visit: Payer: Self-pay

## 2024-02-09 DIAGNOSIS — F411 Generalized anxiety disorder: Secondary | ICD-10-CM

## 2024-02-09 NOTE — Telephone Encounter (Signed)
 Pended alprazolam  to [facility]

## 2024-07-15 ENCOUNTER — Ambulatory Visit: Admitting: Internal Medicine
